# Patient Record
Sex: Female | Born: 1980
Health system: Southern US, Community
[De-identification: ages and names within clinical notes are randomized; demographics above are authoritative.]

## PROBLEM LIST (undated history)

## (undated) DIAGNOSIS — G35D Multiple sclerosis, unspecified: Secondary | ICD-10-CM

## (undated) DIAGNOSIS — H539 Unspecified visual disturbance: Secondary | ICD-10-CM

## (undated) DIAGNOSIS — K5909 Other constipation: Secondary | ICD-10-CM

## (undated) DIAGNOSIS — K589 Irritable bowel syndrome without diarrhea: Secondary | ICD-10-CM

## (undated) DIAGNOSIS — K599 Functional intestinal disorder, unspecified: Secondary | ICD-10-CM

## (undated) DIAGNOSIS — N3289 Other specified disorders of bladder: Secondary | ICD-10-CM

## (undated) DIAGNOSIS — K219 Gastro-esophageal reflux disease without esophagitis: Secondary | ICD-10-CM

## (undated) DIAGNOSIS — N289 Disorder of kidney and ureter, unspecified: Secondary | ICD-10-CM

## (undated) DIAGNOSIS — G629 Polyneuropathy, unspecified: Secondary | ICD-10-CM

## (undated) DIAGNOSIS — G4700 Insomnia, unspecified: Secondary | ICD-10-CM

## (undated) DIAGNOSIS — G35 Multiple sclerosis: Secondary | ICD-10-CM

## (undated) HISTORY — DX: Other constipation: K59.09

## (undated) HISTORY — PX: BREAST SURGERY: SHX581

## (undated) HISTORY — DX: Gastro-esophageal reflux disease without esophagitis: K21.9

## (undated) HISTORY — DX: Polyneuropathy, unspecified: G62.9

## (undated) HISTORY — DX: Insomnia, unspecified: G47.00

## (undated) HISTORY — PX: CYSTECTOMY: SUR359

## (undated) HISTORY — PX: OTHER SURGICAL HISTORY: SHX169

## (undated) HISTORY — PX: UPPER GASTROINTESTINAL ENDOSCOPY: SHX188

## (undated) HISTORY — DX: Unspecified visual disturbance: H53.9

---

## 2003-02-05 HISTORY — PX: BREAST SURGERY: SHX581

## 2011-08-22 ENCOUNTER — Ambulatory Visit
Admission: RE | Admit: 2011-08-22 | Discharge: 2011-08-22 | Disposition: A | Discharge status: Home / Self Care | Payer: No Typology Code available for payment source | Source: Ambulatory Visit | Attending: Specialist | Admitting: Specialist

## 2011-08-22 ENCOUNTER — Other Ambulatory Visit: Payer: Self-pay | Admitting: Specialist

## 2011-08-22 DIAGNOSIS — R52 Pain, unspecified: Secondary | ICD-10-CM

## 2011-11-13 ENCOUNTER — Other Ambulatory Visit: Payer: Self-pay | Admitting: Psychiatry

## 2011-11-13 DIAGNOSIS — G35 Multiple sclerosis: Secondary | ICD-10-CM

## 2011-11-17 ENCOUNTER — Ambulatory Visit
Admission: RE | Admit: 2011-11-17 | Discharge: 2011-11-17 | Disposition: A | Discharge status: Home / Self Care | Payer: Medicaid Other | Source: Ambulatory Visit | Attending: Psychiatry | Admitting: Psychiatry

## 2011-11-17 DIAGNOSIS — G35 Multiple sclerosis: Secondary | ICD-10-CM

## 2011-11-17 MED ORDER — GADOBENATE DIMEGLUMINE 529 MG/ML IV SOLN: 20.0000 mL | Freq: Once | INTRAVENOUS | Status: AC | PRN

## 2011-11-17 MED ORDER — GADOBENATE DIMEGLUMINE 529 MG/ML IV SOLN
20.0000 mL | Freq: Once | INTRAVENOUS | Status: AC | PRN
  Administered 2011-11-17: 20 mL via INTRAVENOUS

## 2012-02-05 ENCOUNTER — Emergency Department (HOSPITAL_COMMUNITY)
Admission: EM | Admit: 2012-02-05 | Discharge: 2012-02-05 | Disposition: A | Discharge status: Home / Self Care | Payer: Self-pay | Attending: Emergency Medicine | Admitting: Emergency Medicine

## 2012-02-05 ENCOUNTER — Emergency Department (HOSPITAL_COMMUNITY): Payer: Self-pay

## 2012-02-05 ENCOUNTER — Encounter (HOSPITAL_COMMUNITY): Payer: Self-pay

## 2012-02-05 DIAGNOSIS — G35 Multiple sclerosis: Secondary | ICD-10-CM | POA: Insufficient documentation

## 2012-02-05 DIAGNOSIS — G43909 Migraine, unspecified, not intractable, without status migrainosus: Secondary | ICD-10-CM | POA: Insufficient documentation

## 2012-02-05 DIAGNOSIS — Z87448 Personal history of other diseases of urinary system: Secondary | ICD-10-CM | POA: Insufficient documentation

## 2012-02-05 DIAGNOSIS — Z8719 Personal history of other diseases of the digestive system: Secondary | ICD-10-CM | POA: Insufficient documentation

## 2012-02-05 DIAGNOSIS — R002 Palpitations: Secondary | ICD-10-CM | POA: Insufficient documentation

## 2012-02-05 DIAGNOSIS — Z79899 Other long term (current) drug therapy: Secondary | ICD-10-CM | POA: Insufficient documentation

## 2012-02-05 DIAGNOSIS — H9209 Otalgia, unspecified ear: Secondary | ICD-10-CM | POA: Insufficient documentation

## 2012-02-05 HISTORY — DX: Multiple sclerosis, unspecified: G35.D

## 2012-02-05 HISTORY — DX: Multiple sclerosis: G35

## 2012-02-05 HISTORY — DX: Irritable bowel syndrome, unspecified: K58.9

## 2012-02-05 HISTORY — DX: Functional intestinal disorder, unspecified: K59.9

## 2012-02-05 HISTORY — DX: Other specified disorders of bladder: N32.89

## 2012-02-05 HISTORY — DX: Disorder of kidney and ureter, unspecified: N28.9

## 2012-02-05 LAB — URINALYSIS, ROUTINE W REFLEX MICROSCOPIC
Hgb urine dipstick: NEGATIVE
Leukocytes, UA: NEGATIVE
Protein, ur: NEGATIVE mg/dL
Urobilinogen, UA: 1 mg/dL (ref 0.0–1.0)

## 2012-02-05 LAB — CBC WITH DIFFERENTIAL/PLATELET
Eosinophils Relative: 1 % (ref 0–5)
HCT: 33.3 % — ABNORMAL LOW (ref 36.0–46.0)
Lymphocytes Relative: 45 % (ref 12–46)
Lymphs Abs: 3.1 10*3/uL (ref 0.7–4.0)
MCV: 77.8 fL — ABNORMAL LOW (ref 78.0–100.0)
Monocytes Absolute: 0.4 10*3/uL (ref 0.1–1.0)
Monocytes Relative: 6 % (ref 3–12)
RBC: 4.28 MIL/uL (ref 3.87–5.11)
RDW: 13.7 % (ref 11.5–15.5)
WBC: 7 10*3/uL (ref 4.0–10.5)

## 2012-02-05 LAB — COMPREHENSIVE METABOLIC PANEL
BUN: 12 mg/dL (ref 6–23)
CO2: 25 mEq/L (ref 19–32)
Calcium: 9.8 mg/dL (ref 8.4–10.5)
Creatinine, Ser: 0.68 mg/dL (ref 0.50–1.10)
GFR calc Af Amer: >90 mL/min (ref >90)
GFR calc non Af Amer: >90 mL/min (ref >90)
Glucose, Bld: 88 mg/dL (ref 70–99)

## 2012-02-05 MED ORDER — METOCLOPRAMIDE HCL 5 MG/ML IJ SOLN
10.0000 mg | Freq: Once | INTRAMUSCULAR | Status: AC
  Administered 2012-02-05: 10 mg via INTRAVENOUS
  Filled 2012-02-05: qty 2

## 2012-02-05 MED ORDER — SODIUM CHLORIDE 0.9 % IV BOLUS (SEPSIS)
1000.0000 mL | Freq: Once | INTRAVENOUS | Status: AC
  Administered 2012-02-05: 1000 mL via INTRAVENOUS

## 2012-02-05 NOTE — ED Notes (Signed)
Patient reports that she has been having palpitations intermittently x 4 days. Patient has a history MS. Patient also c/o sharp left ear pain and left occipital pain. Patient states she was treated for an ear infection in November/2013

## 2012-02-05 NOTE — ED Provider Notes (Signed)
History     CSN: [id number]  Arrival date & time 02/05/12  1742   First MD Initiated Contact with Patient 02/05/12 1823      Chief Complaint  Patient presents with  . palpatations   . Otalgia  . Headache    (Consider location/radiation/quality/duration/timing/severity/associated sxs/prior treatment) The history is provided by the patient.  Joy Cobb is a 32 y.o. female history of relapsing remitting MS here presenting with palpitations and left ear pain and L sided headaches and R foot pain. She's been having left-sided headaches and left ear pain for the last 3 weeks. She was diagnosed with left otitis externa 2 months ago and was placed on Ciprodex. She had intermittent headaches since then, felt like sharp sensation. She has been taking her norco and lyrica and it hasn't helped. No blurry vision. No fevers. Also intermittent palpitations but no chest pain. She also has pain in R foot pain that is progressively worse. No injuries. She usually walks with a walker and now she felt worse. She had an exacerbation a year ago but she said that she was much weaker last time.    Past Medical History  Diagnosis Date  . Multiple sclerosis   . IBS (irritable bowel syndrome)   . Colonic inertia   . Renal disorder   . Bladder spasms     Past Surgical History  Procedure Date  . Breast surgery   . Cystectomy   . Polyp removal     No family history on file.  History  Substance Use Topics  . Smoking status: Never Smoker   . Smokeless tobacco: Never Used  . Alcohol Use: No    OB History    Grav Para Term Preterm Abortions TAB SAB Ect Mult Living                  Review of Systems  HENT: Positive for ear pain.   Cardiovascular: Positive for palpitations.  Neurological: Positive for headaches.  All other systems reviewed and are negative.    Allergies  Penicillins and Septra  Home Medications   Current Outpatient Rx  Name  Route  Sig  Dispense  Refill  .  AMPHETAMINE-DEXTROAMPHETAMINE 15 MG PO TABS   Oral   Take 15 mg by mouth daily.         Marland Kitchen BACLOFEN 10 MG PO TABS   Oral   Take 10 mg by mouth 3 (three) times daily.         Marland Kitchen DIMETHYL FUMARATE 240 MG PO CPDR   Oral   Take 240 mg by mouth 2 (two) times daily.         . DROSPIRENONE-ETHINYL ESTRADIOL 3-0.02 MG PO TABS   Oral   Take 1 tablet by mouth daily.         . DULOXETINE HCL 30 MG PO CPEP   Oral   Take 30 mg by mouth daily.         . FENTANYL 25 MCG/HR TD PT72   Transdermal   Place 1 patch onto the skin every 3 (three) days.         Marland Kitchen FLUOXETINE HCL 20 MG PO CAPS   Oral   Take 20 mg by mouth daily.         Marland Kitchen HYDROCODONE-ACETAMINOPHEN 7.5-325 MG PO TABS   Oral   Take 1 tablet by mouth every 6 (six) hours as needed. PAIN         . LACTULOSE 10 GM/15ML  PO SOLN   Oral   Take 20 g by mouth 3 (three) times daily.         Marland Kitchen OMEPRAZOLE 40 MG PO CPDR   Oral   Take 40 mg by mouth 2 (two) times daily.         Marland Kitchen PREGABALIN 300 MG PO CAPS   Oral   Take 300 mg by mouth daily.           BP 110/46  Pulse 87  Temp 98 F (36.7 C) (Oral)  Resp 18  SpO2 99%  Physical Exam  Nursing note and vitals reviewed. Constitutional: She is oriented to person, place, and time. She appears well-developed and well-nourished.       Anxious   HENT:  Head: Normocephalic.  Right Ear: External ear normal.  Left Ear: External ear normal.  Mouth/Throat: Oropharynx is clear and moist.       Tenderness L side of scalp. Ears unremarkable. No otitis externa or media observed   Eyes: Conjunctivae normal are normal. Pupils are equal, round, and reactive to light.  Neck: Normal range of motion. Neck supple.  Cardiovascular: Normal rate, regular rhythm and normal heart sounds.   Pulmonary/Chest: Effort normal and breath sounds normal. No respiratory distress. She has no wheezes. She has no rales.  Abdominal: Soft. Bowel sounds are normal. She exhibits no distension. There  is no tenderness. There is no rebound.  Musculoskeletal: Normal range of motion.       1+ edema (chronic). Mild tenderness on the plantar side of R foot.   Neurological: She is alert and oriented to person, place, and time.       Strength 4/5 L leg, 5/5 throughout.   Skin: Skin is warm and dry.  Psychiatric: She has a normal mood and affect. Her behavior is normal. Judgment and thought content normal.    ED Course  Procedures (including critical care time)  Labs Reviewed  CBC WITH DIFFERENTIAL - Abnormal; Notable for the following:    HCT 33.3 (*)     MCV 77.8 (*)     MCHC 36.3 (*)     All other components within normal limits  COMPREHENSIVE METABOLIC PANEL - Abnormal; Notable for the following:    Total Bilirubin 0.2 (*)     All other components within normal limits  TROPONIN I  URINALYSIS, ROUTINE W REFLEX MICROSCOPIC   Dg Chest 2 View  02/05/2012  *RADIOLOGY REPORT*  Clinical Data: palpitations and chest pain  CHEST - 2 VIEW  Comparison: None  Findings: The heart size and mediastinal contours are within normal limits.  Both lungs are clear.  The visualized skeletal structures are unremarkable.  IMPRESSION: No active cardiopulmonary abnormalities.   Original Report Authenticated By: Signa Kell, M.D.    Ct Head Wo Contrast  02/05/2012  *RADIOLOGY REPORT*  Clinical Data: Headache, otalgia  CT HEAD WITHOUT CONTRAST  Technique:  Contiguous axial images were obtained from the base of the skull through the vertex without contrast.  Comparison: MRI cervical spine 11/17/2011  Findings: No acute intracranial hemorrhage, acute infarction, mass lesion, mass effect, hydrocephalus or midline shift.  The gray- white interface is well preserved.  No significant atrophy.  No focal soft tissue abnormality.  No focal calvarial abnormality. The globes are intact.  Normal aeration of the paranasal sinuses and mastoid air cells.  IMPRESSION: Negative noncontrast CT scan of the head.   Original Report  Authenticated By: Malachy Moan, M.D.    Dg Foot Complete  Right  02/05/2012  *RADIOLOGY REPORT*  Clinical Data: Right foot pain  RIGHT FOOT COMPLETE - 3+ VIEW  Comparison: 08/22/2011  Findings: Pes planus deformity.  There is no evidence of fracture or dislocation.  There is no evidence of arthropathy or other focal bone abnormality.  Soft tissues are unremarkable.  IMPRESSION: Negative exam.   Original Report Authenticated By: Signa Kell, M.D.      No diagnosis found.   Date: 02/05/2012  Rate: 84  Rhythm: normal sinus rhythm  QRS Axis: normal  Intervals: normal  ST/T Wave abnormalities: normal  Conduction Disutrbances:none  Narrative Interpretation:   Old EKG Reviewed: none available   MDM  Joy Cobb is a 32 y.o. female here with L sided headache and R foot pain. I think she likely has mild MS exacerbation. I will get basic labs and UA and CXR to r/o infectious cause.    8:57 PM Labs nl, CT head nl, R foot xray nl, UA  And CXR nl. Headache improved. I talked to Dr. Danae Orleans from neuro, who suggest that if she is weaker, she can benefit from a course of IV steroids. I discussed with the patient, who is very afraid of IV steroids since she had it in the past and had side effects. She states that her symptoms are more mild than previously and she rather follow up with neurologist. Return precautions given.          Richardean Canal, MD 02/05/12 2100

## 2012-02-05 NOTE — ED Notes (Signed)
Pt states the past 4 days she has had heart palpitations, L ear ache w/ sharp pains in head. States she had an ear infection in November in that same ear. Pt denies shortness of breath or headache at this time, states does get short of breath when walking or talking on phone sometimes.

## 2012-02-05 NOTE — ED Notes (Signed)
RN to obtain labs with start of IV 

## 2012-02-05 NOTE — ED Notes (Signed)
Patient transported to X-ray 

## 2012-02-14 ENCOUNTER — Inpatient Hospital Stay (HOSPITAL_COMMUNITY)
Admission: EM | Admit: 2012-02-14 | Discharge: 2012-02-17 | DRG: 060 | Disposition: A | Discharge status: Home Health | Payer: Medicaid Other | Attending: Internal Medicine | Admitting: Internal Medicine

## 2012-02-14 ENCOUNTER — Encounter (HOSPITAL_COMMUNITY): Payer: Self-pay | Admitting: *Deleted

## 2012-02-14 DIAGNOSIS — F329 Major depressive disorder, single episode, unspecified: Secondary | ICD-10-CM

## 2012-02-14 DIAGNOSIS — Z880 Allergy status to penicillin: Secondary | ICD-10-CM

## 2012-02-14 DIAGNOSIS — G35 Multiple sclerosis: Principal | ICD-10-CM | POA: Diagnosis present

## 2012-02-14 DIAGNOSIS — Z881 Allergy status to other antibiotic agents status: Secondary | ICD-10-CM

## 2012-02-14 DIAGNOSIS — K219 Gastro-esophageal reflux disease without esophagitis: Secondary | ICD-10-CM | POA: Diagnosis present

## 2012-02-14 DIAGNOSIS — IMO0002 Reserved for concepts with insufficient information to code with codable children: Secondary | ICD-10-CM

## 2012-02-14 DIAGNOSIS — Z6839 Body mass index (BMI) 39.0-39.9, adult: Secondary | ICD-10-CM

## 2012-02-14 DIAGNOSIS — E669 Obesity, unspecified: Secondary | ICD-10-CM | POA: Diagnosis present

## 2012-02-14 DIAGNOSIS — Z79899 Other long term (current) drug therapy: Secondary | ICD-10-CM

## 2012-02-14 DIAGNOSIS — K589 Irritable bowel syndrome without diarrhea: Secondary | ICD-10-CM

## 2012-02-14 DIAGNOSIS — N3289 Other specified disorders of bladder: Secondary | ICD-10-CM | POA: Diagnosis present

## 2012-02-14 DIAGNOSIS — Z888 Allergy status to other drugs, medicaments and biological substances status: Secondary | ICD-10-CM

## 2012-02-14 DIAGNOSIS — F3289 Other specified depressive episodes: Secondary | ICD-10-CM | POA: Diagnosis present

## 2012-02-14 LAB — URINALYSIS, ROUTINE W REFLEX MICROSCOPIC
Bilirubin Urine: NEGATIVE
Glucose, UA: NEGATIVE mg/dL
Hgb urine dipstick: NEGATIVE
Ketones, ur: NEGATIVE mg/dL
Leukocytes, UA: NEGATIVE
Nitrite: NEGATIVE
Protein, ur: NEGATIVE mg/dL
Specific Gravity, Urine: 1.023 (ref 1.005–1.030)
Urobilinogen, UA: 1 mg/dL (ref 0.0–1.0)
pH: 7.5 (ref 5.0–8.0)

## 2012-02-14 MED ORDER — KETOROLAC TROMETHAMINE 30 MG/ML IJ SOLN
30.0000 mg | Freq: Once | INTRAMUSCULAR | Status: AC
  Administered 2012-02-15: 30 mg via INTRAVENOUS
  Filled 2012-02-14: qty 1

## 2012-02-14 MED ORDER — METHYLPREDNISOLONE SODIUM SUCC 125 MG IJ SOLR
125.0000 mg | Freq: Once | INTRAMUSCULAR | Status: AC
  Administered 2012-02-15: 125 mg via INTRAVENOUS
  Filled 2012-02-14: qty 2

## 2012-02-14 NOTE — ED Notes (Signed)
Patient says that she is having pain in her head, legs, feet and back. Pt says she was treated for MS exacerbation recently and pain has not improved

## 2012-02-15 ENCOUNTER — Encounter (HOSPITAL_COMMUNITY): Payer: Self-pay | Admitting: Internal Medicine

## 2012-02-15 DIAGNOSIS — F3289 Other specified depressive episodes: Secondary | ICD-10-CM

## 2012-02-15 DIAGNOSIS — F329 Major depressive disorder, single episode, unspecified: Secondary | ICD-10-CM

## 2012-02-15 DIAGNOSIS — F32A Depression, unspecified: Secondary | ICD-10-CM | POA: Diagnosis present

## 2012-02-15 DIAGNOSIS — K589 Irritable bowel syndrome without diarrhea: Secondary | ICD-10-CM

## 2012-02-15 DIAGNOSIS — G35 Multiple sclerosis: Principal | ICD-10-CM

## 2012-02-15 DIAGNOSIS — K219 Gastro-esophageal reflux disease without esophagitis: Secondary | ICD-10-CM | POA: Diagnosis present

## 2012-02-15 DIAGNOSIS — G35D Multiple sclerosis, unspecified: Principal | ICD-10-CM | POA: Diagnosis present

## 2012-02-15 LAB — BASIC METABOLIC PANEL
BUN: 13 mg/dL (ref 6–23)
Calcium: 9.2 mg/dL (ref 8.4–10.5)
Chloride: 99 mEq/L (ref 96–112)
Creatinine, Ser: 0.76 mg/dL (ref 0.50–1.10)
GFR calc Af Amer: >90 mL/min (ref >90)
GFR calc non Af Amer: >90 mL/min (ref >90)

## 2012-02-15 LAB — CBC WITH DIFFERENTIAL/PLATELET
Basophils Absolute: 0 10*3/uL (ref 0.0–0.1)
Basophils Relative: 0 % (ref 0–1)
Eosinophils Absolute: 0 10*3/uL (ref 0.0–0.7)
Eosinophils Relative: 0 % (ref 0–5)
HCT: 36.4 % (ref 36.0–46.0)
MCH: 28.5 pg (ref 26.0–34.0)
MCHC: 36 g/dL (ref 30.0–36.0)
Monocytes Absolute: 0.7 10*3/uL (ref 0.1–1.0)
Neutro Abs: 10 10*3/uL — ABNORMAL HIGH (ref 1.7–7.7)
RDW: 14.2 % (ref 11.5–15.5)

## 2012-02-15 LAB — TSH: TSH: 0.246 u[IU]/mL — ABNORMAL LOW (ref 0.350–4.500)

## 2012-02-15 MED ORDER — FENTANYL 25 MCG/HR TD PT72
25.0000 ug | MEDICATED_PATCH | TRANSDERMAL | Status: DC
  Filled 2012-02-15: qty 1

## 2012-02-15 MED ORDER — PANTOPRAZOLE SODIUM 40 MG PO TBEC
40.0000 mg | DELAYED_RELEASE_TABLET | Freq: Every day | ORAL | Status: DC
  Administered 2012-02-16 – 2012-02-17 (×2): 40 mg via ORAL
  Filled 2012-02-15 (×2): qty 1

## 2012-02-15 MED ORDER — ONDANSETRON HCL 4 MG PO TABS: 4.0000 mg | ORAL_TABLET | Freq: Four times a day (QID) | ORAL | Status: DC | PRN

## 2012-02-15 MED ORDER — PREGABALIN 100 MG PO CAPS
300.0000 mg | ORAL_CAPSULE | Freq: Every day | ORAL | Status: DC
  Administered 2012-02-15 – 2012-02-16 (×2): 300 mg via ORAL
  Filled 2012-02-15: qty 1
  Filled 2012-02-15: qty 3
  Filled 2012-02-15: qty 2

## 2012-02-15 MED ORDER — SODIUM CHLORIDE 0.9 % IV SOLN
INTRAVENOUS | Status: AC
  Administered 2012-02-15: 06:00:00 via INTRAVENOUS

## 2012-02-15 MED ORDER — SODIUM CHLORIDE 0.9 % IV SOLN
875.0000 mg | Freq: Once | INTRAVENOUS | Status: AC
  Administered 2012-02-15: 875 mg via INTRAVENOUS
  Filled 2012-02-15: qty 7.04

## 2012-02-15 MED ORDER — ZOLPIDEM TARTRATE 5 MG PO TABS
5.0000 mg | ORAL_TABLET | Freq: Every evening | ORAL | Status: DC | PRN
  Administered 2012-02-16 (×2): 5 mg via ORAL
  Filled 2012-02-15 (×2): qty 1

## 2012-02-15 MED ORDER — SODIUM CHLORIDE 0.9 % IV SOLN
1000.0000 mg | INTRAVENOUS | Status: DC
  Administered 2012-02-16 – 2012-02-17 (×2): 1000 mg via INTRAVENOUS
  Filled 2012-02-15 (×2): qty 8

## 2012-02-15 MED ORDER — HYDROMORPHONE HCL PF 1 MG/ML IJ SOLN
1.0000 mg | INTRAMUSCULAR | Status: DC | PRN
  Administered 2012-02-15 – 2012-02-16 (×3): 1 mg via INTRAVENOUS
  Filled 2012-02-15 (×3): qty 1

## 2012-02-15 MED ORDER — DULOXETINE HCL 30 MG PO CPEP
30.0000 mg | ORAL_CAPSULE | Freq: Every day | ORAL | Status: DC
  Filled 2012-02-15 (×3): qty 1

## 2012-02-15 MED ORDER — HYDROMORPHONE HCL PF 1 MG/ML IJ SOLN
0.5000 mg | Freq: Once | INTRAMUSCULAR | Status: AC
  Administered 2012-02-15: 0.5 mg via INTRAVENOUS
  Filled 2012-02-15: qty 1

## 2012-02-15 MED ORDER — ONDANSETRON HCL 4 MG/2ML IJ SOLN: 4.0000 mg | Freq: Four times a day (QID) | INTRAMUSCULAR | Status: DC | PRN

## 2012-02-15 MED ORDER — AMPHETAMINE-DEXTROAMPHETAMINE 10 MG PO TABS
15.0000 mg | ORAL_TABLET | Freq: Every day | ORAL | Status: DC
  Filled 2012-02-15: qty 2

## 2012-02-15 MED ORDER — DROSPIRENONE-ETHINYL ESTRADIOL 3-0.02 MG PO TABS
1.0000 | ORAL_TABLET | Freq: Every day | ORAL | Status: DC
  Administered 2012-02-16 – 2012-02-17 (×2): 1 via ORAL

## 2012-02-15 MED ORDER — ENOXAPARIN SODIUM 40 MG/0.4ML ~~LOC~~ SOLN
40.0000 mg | SUBCUTANEOUS | Status: DC
  Administered 2012-02-15: 40 mg via SUBCUTANEOUS
  Filled 2012-02-15 (×3): qty 0.4

## 2012-02-15 MED ORDER — PANTOPRAZOLE SODIUM 40 MG PO TBEC: 40.0000 mg | DELAYED_RELEASE_TABLET | Freq: Every day | ORAL | Status: DC

## 2012-02-15 MED ORDER — SODIUM CHLORIDE 0.9 % IJ SOLN
3.0000 mL | Freq: Two times a day (BID) | INTRAMUSCULAR | Status: DC
  Administered 2012-02-15 – 2012-02-17 (×4): 3 mL via INTRAVENOUS

## 2012-02-15 MED ORDER — FLUOXETINE HCL 20 MG PO CAPS
20.0000 mg | ORAL_CAPSULE | Freq: Every day | ORAL | Status: DC
  Administered 2012-02-15 – 2012-02-17 (×3): 20 mg via ORAL
  Filled 2012-02-15 (×3): qty 1

## 2012-02-15 MED ORDER — BACLOFEN 10 MG PO TABS
10.0000 mg | ORAL_TABLET | Freq: Two times a day (BID) | ORAL | Status: DC
  Administered 2012-02-15 – 2012-02-17 (×5): 10 mg via ORAL
  Filled 2012-02-15 (×6): qty 1

## 2012-02-15 MED ORDER — HYDROCODONE-ACETAMINOPHEN 7.5-325 MG PO TABS
2.0000 | ORAL_TABLET | Freq: Four times a day (QID) | ORAL | Status: DC | PRN
  Administered 2012-02-15 – 2012-02-17 (×4): 2 via ORAL
  Filled 2012-02-15 (×4): qty 2

## 2012-02-15 MED ORDER — DIMETHYL FUMARATE 240 MG PO CPDR: 240.0000 mg | DELAYED_RELEASE_CAPSULE | Freq: Two times a day (BID) | ORAL | Status: DC

## 2012-02-15 MED ORDER — LACTULOSE 10 GM/15ML PO SOLN
20.0000 g | Freq: Two times a day (BID) | ORAL | Status: DC | PRN
  Filled 2012-02-15: qty 30

## 2012-02-15 NOTE — H&P (Signed)
[facility] History and Physical  Joy Cobb ZOX:[id number] DOB: 1980-10-17    PCP:   In [place], she recently moved here.  Chief Complaint: back pain, paresthesia, and weakness.  HPI: Joy Cobb is an 32 y.o. female with hx of remitting/relapsing MS, hx of IBS, bladder spasm, on Tecfidera, presents to the ER about 10 days ago complaining of URI symptoms, but was also having back and leg pain and paresthesia.  She was evaluated, found no evidence of infection, and declined any treatment for her MS exacerbation.  She was started however on Prednisone, and continued to have back pain, leg pain and paresthesia and weakness, so she returned to the ER.  She denied any HA, fever, chills, nausea or vomiting.  Evaluation in the ER showed WBC of 14K, and electrolytes along with renal fx tests were unremarkable.  Hospitalist was asked to admit her for tx of MS exacerbation.  She has no bowel or bladder incontinence.  Rewiew of Systems:  Constitutional: Negative for malaise, fever and chills. No significant weight loss or weight gain Eyes: Negative for eye pain, redness and discharge, diplopia, visual changes, or flashes of light. ENMT: Negative for ear pain, hoarseness, nasal congestion, sinus pressure and sore throat. No headaches; tinnitus, drooling, or problem swallowing. Cardiovascular: Negative for chest pain, palpitations, diaphoresis, dyspnea and peripheral edema. ; No orthopnea, PND Respiratory: Negative for cough, hemoptysis, wheezing and stridor. No pleuritic chestpain. Gastrointestinal: Negative for nausea, vomiting, diarrhea, constipation, abdominal pain, melena, blood in stool, hematemesis, jaundice and rectal bleeding.    Genitourinary: Negative for frequency, dysuria, incontinence,flank pain and hematuria; Musculoskeletal: Negative for and neck pain. Negative for swelling and trauma.;  Skin: . Negative for pruritus, rash, abrasions, bruising and skin lesion.;  ulcerations Neuro: Negative for headache, lightheadedness and neck stiffness. Negative for weakness, altered level of consciousness , altered mental status,  burning feet, involuntary movement, seizure and syncope.  Psych: negative for anxiety, depression, insomnia, tearfulness, panic attacks, hallucinations, paranoia, suicidal or homicidal ideation    Past Medical History  Diagnosis Date  . Multiple sclerosis   . IBS (irritable bowel syndrome)   . Colonic inertia   . Renal disorder   . Bladder spasms     Past Surgical History  Procedure Date  . Breast surgery   . Cystectomy   . Polyp removal     Medications:  HOME MEDS: Prior to Admission medications   Medication Sig Start Date End Date Taking? Authorizing Provider  amphetamine-dextroamphetamine (ADDERALL) 15 MG tablet Take 15 mg by mouth daily.   Yes Historical Provider, MD  baclofen (LIORESAL) 10 MG tablet Take 10 mg by mouth 2 (two) times daily.    Yes Historical Provider, MD  Dimethyl Fumarate (TECFIDERA) 240 MG CPDR Take 240 mg by mouth 2 (two) times daily.   Yes Historical Provider, MD  drospirenone-ethinyl estradiol (YAZ,GIANVI,LORYNA) 3-0.02 MG tablet Take 1 tablet by mouth daily.   Yes Historical Provider, MD  DULoxetine (CYMBALTA) 30 MG capsule Take 30 mg by mouth at bedtime.    Yes Historical Provider, MD  fentaNYL (DURAGESIC - DOSED MCG/HR) 25 MCG/HR Place 1 patch onto the skin every 3 (three) days.   Yes Historical Provider, MD  FLUoxetine (PROZAC) 20 MG capsule Take 20 mg by mouth daily.   Yes Historical Provider, MD  HYDROcodone-acetaminophen (NORCO) 7.5-325 MG per tablet Take 2 tablets by mouth every 6 (six) hours as needed. PAIN   Yes Historical Provider, MD  lactulose (CHRONULAC) 10 GM/15ML solution Take 20 g  by mouth 2 (two) times daily as needed. constipation   Yes Historical Provider, MD  omeprazole (PRILOSEC) 40 MG capsule Take 40 mg by mouth 2 (two) times daily.   Yes Historical Provider, MD  predniSONE  (DELTASONE) 20 MG tablet Take 10-200 mg by mouth See admin instructions. 10tabs x 5days, 5tabs x 2days, 3tabs x 2days, 2tabs x 2days, 1tab x 2days, 1/2tab x 2 days   Yes Historical Provider, MD  pregabalin (LYRICA) 300 MG capsule Take 300 mg by mouth at bedtime.    Yes Historical Provider, MD     Allergies:  Allergies  Allergen Reactions  . Amoxicillin   . Penicillins Hives  . Temazepam     Sharp pains in stomach  . Septra (Sulfamethoxazole W-Trimethoprim) Rash    Social History:   reports that she has never smoked. She has never used smokeless tobacco. She reports that she does not drink alcohol or use illicit drugs.  Family History: No family history on file.   Physical Exam: Filed Vitals:   02/14/12 1959 02/14/12 2330 02/15/12 0149 02/15/12 0301  BP: 139/85 166/91 142/87 115/73  Pulse: 55 56 58 70  Temp: 99.1 F (37.3 C)   98.3 F (36.8 C)  TempSrc:    Oral  Resp: 18  16 18   Height:    5\' 5"  (1.651 m)  Weight:    106.7 kg (235 lb 3.7 oz)  SpO2: 99% 100% 100% 99%   Blood pressure 115/73, pulse 70, temperature 98.3 F (36.8 C), temperature source Oral, resp. rate 18, height 5\' 5"  (1.651 m), weight 106.7 kg (235 lb 3.7 oz), SpO2 99.00%.  GEN:  Pleasant patient lying in the stretcher in no acute distress; cooperative with exam. PSYCH:  alert and oriented x4; does not appear anxious or depressed; affect is appropriate. HEENT: Mucous membranes pink and anicteric; PERRLA; EOM intact; no cervical lymphadenopathy nor thyromegaly or carotid bruit; no JVD; There were no stridor. Neck is very supple. Breasts:: Not examined CHEST WALL: No tenderness CHEST: Normal respiration, clear to auscultation bilaterally.  HEART: Regular rate and rhythm.  There are no murmur, rub, or gallops.   BACK: No kyphosis or scoliosis; no CVA tenderness ABDOMEN: soft and non-tender; no masses, no organomegaly, normal abdominal bowel sounds; no pannus; no intertriginous candida. There is no rebound and  no distention. Rectal Exam: Not done EXTREMITIES: No bone or joint deformity; age-appropriate arthropathy of the hands and knees; no edema; no ulcerations.  There is no calf tenderness. Genitalia: not examined PULSES: 2+ and symmetric SKIN: Normal hydration no rash or ulceration CNS: Cranial nerves 2-12 grossly intact no focal lateralizing neurologic deficit.  Speech is fluent; uvula elevated with phonation, facial symmetry and tongue midline. DTR are normal bilaterally, cerebella exam is intact, barbinski is negative and strengths are equaled bilaterally.  No sensory loss.   Labs on Admission:  Basic Metabolic Panel:  Lab 02/14/12 4098  NA 136  K 3.7  CL 99  CO2 29  GLUCOSE 98  BUN 13  CREATININE 0.76  CALCIUM 9.2  MG --  PHOS --   Liver Function Tests: No results found for this basename: AST:5,ALT:5,ALKPHOS:5,BILITOT:5,PROT:5,ALBUMIN:5 in the last 168 hours No results found for this basename: LIPASE:5,AMYLASE:5 in the last 168 hours No results found for this basename: AMMONIA:5 in the last 168 hours CBC:  Lab 02/14/12 2345  WBC 14.0*  NEUTROABS 10.0*  HGB 13.1  HCT 36.4  MCV 79.1  PLT 247   Cardiac Enzymes: No results found  for this basename: CKTOTAL:5,CKMB:5,CKMBINDEX:5,TROPONINI:5 in the last 168 hours  CBG: No results found for this basename: GLUCAP:5 in the last 168 hours   Radiological Exams on Admission: No results found.   Assessment/Plan Present on Admission:  . IBS (irritable bowel syndrome) . Depression MS exacerbation.  PLAN:  Will admit her for MS exacerbation.  The new MS medication Tecfidera can have cardiac symptoms so will put her on telemetry. She had an EKG on 02/05/12, so I didn't repeat one.  For her MS, will give high dose steroid, IVF along with IV pain meds.  I have continued her other medications.  Will hold off on giving IVIG at this time. She had received plasmapheresis before, but her symptoms are milder this time. She is stable, full  code, and will be admitted to [facility] service.  I plan to complete the steroid IV for three days total.  Thank you for allowing me to partake in the care of your patient. Other plans as per orders.  Code Status: FULL Unk Lightning, MD. [facility] Pager [phone] 7pm to 7am.  02/15/2012, 3:51 AM

## 2012-02-15 NOTE — Progress Notes (Signed)
SR on tele throughout day.  Denies new discomfort, sx

## 2012-02-15 NOTE — Progress Notes (Signed)
Followup note:   Patient admitted earlier this morning. Seen after arrival to floor. Currently very tired, no acute complaints. Vital signs stable.  Cardiovascular regular rate and rhythm, S1-S2 Lungs: Clear to auscultation bilaterally  Assessment and plan: MS flare: Continue IV site Metro. Next dose due tomorrow. Monitor for weakness.

## 2012-02-15 NOTE — Progress Notes (Signed)
Patient came up from ED.  Called Dr. Conley Rolls and he confirmed that cardiac monitoring was needed.  Patient was stable and comfortable.  Called report to [facility].  The nurse tech and I then transferred her to room 1439.  Fatima Sanger A

## 2012-02-15 NOTE — ED Notes (Signed)
Bed:WA06<BR> Expected date:<BR> Expected time:<BR> Means of arrival:<BR> Comments:<BR>

## 2012-02-15 NOTE — ED Provider Notes (Signed)
History     CSN: [id number]  Arrival date & time 02/14/12  7829   First MD Initiated Contact with Patient 02/14/12 2221      Chief Complaint  Patient presents with  . Weakness    (Consider location/radiation/quality/duration/timing/severity/associated sxs/prior treatment) HPI History provided by pt.   Pt has severe relapsing MS.  Most recent exacerbation in 01/2011; admitted in [place], where she  recently re-located from.  For the past 1+ week, she has been experiencing severe low back and bilateral LE pain.  Associated w/ LE weakness and paresthesias.  All of these sx are typical of her exacerbations.  Denies fever and urinary/bladder dysfunction.  Denies trauma.  No recent illnesses.  Was evaluated by her neurologist at onset,  has been compliant w/ prescribed course of prednisone. And sx have worsened over past 2 days.   Past Medical History  Diagnosis Date  . Multiple sclerosis   . IBS (irritable bowel syndrome)   . Colonic inertia   . Renal disorder   . Bladder spasms     Past Surgical History  Procedure Date  . Breast surgery   . Cystectomy   . Polyp removal     No family history on file.  History  Substance Use Topics  . Smoking status: Never Smoker   . Smokeless tobacco: Never Used  . Alcohol Use: No    OB History    Grav Para Term Preterm Abortions TAB SAB Ect Mult Living                  Review of Systems  All other systems reviewed and are negative.    Allergies  Amoxicillin; Penicillins; Temazepam; and Septra  Home Medications   Current Outpatient Rx  Name  Route  Sig  Dispense  Refill  . AMPHETAMINE-DEXTROAMPHETAMINE 15 MG PO TABS   Oral   Take 15 mg by mouth daily.         Marland Kitchen BACLOFEN 10 MG PO TABS   Oral   Take 10 mg by mouth 2 (two) times daily.          Marland Kitchen DIMETHYL FUMARATE 240 MG PO CPDR   Oral   Take 240 mg by mouth 2 (two) times daily.         . DROSPIRENONE-ETHINYL ESTRADIOL 3-0.02 MG PO TABS   Oral   Take 1  tablet by mouth daily.         . DULOXETINE HCL 30 MG PO CPEP   Oral   Take 30 mg by mouth at bedtime.          . FENTANYL 25 MCG/HR TD PT72   Transdermal   Place 1 patch onto the skin every 3 (three) days.         Marland Kitchen FLUOXETINE HCL 20 MG PO CAPS   Oral   Take 20 mg by mouth daily.         Marland Kitchen HYDROCODONE-ACETAMINOPHEN 7.5-325 MG PO TABS   Oral   Take 2 tablets by mouth every 6 (six) hours as needed. PAIN         . LACTULOSE 10 GM/15ML PO SOLN   Oral   Take 20 g by mouth 2 (two) times daily as needed. constipation         . OMEPRAZOLE 40 MG PO CPDR   Oral   Take 40 mg by mouth 2 (two) times daily.         Marland Kitchen PREDNISONE 20 MG PO TABS  Oral   Take 10-200 mg by mouth See admin instructions. 10tabs x 5days, 5tabs x 2days, 3tabs x 2days, 2tabs x 2days, 1tab x 2days, 1/2tab x 2 days         . PREGABALIN 300 MG PO CAPS   Oral   Take 300 mg by mouth at bedtime.            BP 142/87  Pulse 58  Temp 99.1 F (37.3 C)  Resp 16  SpO2 100%  Physical Exam  Nursing note and vitals reviewed. Constitutional: She is oriented to person, place, and time. She appears well-developed and well-nourished.  HENT:  Head: Normocephalic and atraumatic.  Eyes:       Normal appearance  Neck: Normal range of motion.  Cardiovascular: Normal rate and regular rhythm.   Pulmonary/Chest: Effort normal and breath sounds normal.  Genitourinary:       No CVA ttp  Musculoskeletal:       Low back is non-tender.  Diffuse tenderness of LEs. Full active ROM and 5/5 and equal LE strength.  Nml patellar reflexes.  No saddle anesthesia. Distal sensation intact.  2+ DP pulses.  Per nursing staff, pt can ambulate independently into and out of bathroom.    Neurological: She is alert and oriented to person, place, and time.  Skin: Skin is warm and dry. No rash noted.  Psychiatric: She has a normal mood and affect. Her behavior is normal.    ED Course  Procedures (including critical care  time)  Labs Reviewed  CBC WITH DIFFERENTIAL - Abnormal; Notable for the following:    WBC 14.0 (*)     Neutro Abs 10.0 (*)     All other components within normal limits  URINALYSIS, ROUTINE W REFLEX MICROSCOPIC  BASIC METABOLIC PANEL   No results found.   1. Multiple sclerosis       MDM  32yo F presents w/ cc of typical MS exacerbation w/ severe low back and bilateral LE pain + LE weakness.  Sx worsening despite compliance w/ 8d course of prednisone, prescribed by her neurologist at onset.  Currently unable to take care of herself at home.  On exam, afebrile, no spinal tenderness, NV LEs intact.  Ambulatory per nursing staff.  Labs unremarkable.  Pt did not have any relief w/ IV toradol and dilaudid.  She has received 1000mg  IV solumedrol.  [facility] consulted for admission.          Otilio Miu, PA-C 02/15/12 2314

## 2012-02-16 DIAGNOSIS — K219 Gastro-esophageal reflux disease without esophagitis: Secondary | ICD-10-CM

## 2012-02-16 NOTE — Progress Notes (Signed)
[facility] PROGRESS NOTE  Joy Cobb ZOX:[id number] DOB: [birth year] DOA: 02/14/2012 PCP: No primary provider on file.  Assessment/Plan: Principal Problem:  *Multiple sclerosis: Date 2/3 IV steroids plus continue tecefidera. Active Problems:  IBS (irritable bowel syndrome): Stable  Depression: Stable  GERD (gastroesophageal reflux disease): Stable   Code Status: Full code Family Communication: Plan discuss with patient at the bedside. Disposition Plan: Home tomorrow, possibly with home PT   Consultants:  None  Procedures:  None  Antibiotics:  None  HPI/Subjective: Patient doing okay. Some back and leg pain, although somewhat improved from previous. Managed with pain medication  Objective: Filed Vitals:   02/15/12 0434 02/15/12 1444 02/15/12 2202 02/16/12 0504  BP: 132/72 126/66 141/76 154/93  Pulse: 58 63 60 55  Temp: 98.1 F (36.7 C) 98.2 F (36.8 C) 98.1 F (36.7 C) 98 F (36.7 C)  TempSrc: Oral Oral Oral Oral  Resp: 18  18 18   Height: 5\' 5"  (1.651 m)     Weight: 106.7 kg (235 lb 3.7 oz)     SpO2: 100% 100% 100% 100%    Intake/Output Summary (Last 24 hours) at 02/16/12 1341 Last data filed at 02/16/12 0620  Gross per 24 hour  Intake   1625 ml  Output      3 ml  Net   1622 ml   Filed Weights   02/15/12 0301 02/15/12 0434  Weight: 106.7 kg (235 lb 3.7 oz) 106.7 kg (235 lb 3.7 oz)    Exam:   General:  Alert and oriented x3, no acute distress  Cardiovascular: Regular rate and rhythm, S1-S2  Respiratory: Clear to auscultation bilaterally  Abdomen: Soft, obese, nontender, positive bowel sounds  Extremities: No clubbing or cyanosis, trace pitting edema from midcalf down bilaterally  Data Reviewed: Basic Metabolic Panel:  Lab 02/14/12 8119  NA 136  K 3.7  CL 99  CO2 29  GLUCOSE 98  BUN 13  CREATININE 0.76  CALCIUM 9.2  MG --  PHOS --   CBC:  Lab 02/14/12 2345  WBC 14.0*  NEUTROABS 10.0*  HGB 13.1  HCT 36.4  MCV 79.1    PLT 247     Studies: No results found.  Scheduled Meds:   . amphetamine-dextroamphetamine  15 mg Oral Daily  . baclofen  10 mg Oral BID  . drospirenone-ethinyl estradiol  1 tablet Oral Daily  . DULoxetine  30 mg Oral QHS  . enoxaparin (LOVENOX) injection  40 mg Subcutaneous Q24H  . fentaNYL  25 mcg Transdermal Q72H  . FLUoxetine  20 mg Oral Daily  . methylPREDNISolone (SOLU-MEDROL) injection  1,000 mg Intravenous Q24H  . pantoprazole  40 mg Oral Daily  . pregabalin  300 mg Oral QHS  . sodium chloride  3 mL Intravenous Q12H   Continuous Infusions:   Principal Problem:  *Multiple sclerosis Active Problems:  IBS (irritable bowel syndrome)  Depression  GERD (gastroesophageal reflux disease)    Time spent: 15 minutes    Hollice Espy  [facility] Pager [phone]. If 8PM-8AM, please contact night-coverage at [email or web address], password [facility] 02/16/2012, 1:41 PM  LOS: 2 days

## 2012-02-17 DIAGNOSIS — E669 Obesity, unspecified: Secondary | ICD-10-CM | POA: Diagnosis present

## 2012-02-17 MED ORDER — DIMETHYL FUMARATE 240 MG PO CPDR
240.0000 mg | DELAYED_RELEASE_CAPSULE | Freq: Two times a day (BID) | ORAL | Status: DC
  Administered 2012-02-17: 240 mg via ORAL
  Filled 2012-02-17 (×2): qty 1

## 2012-02-17 MED ORDER — OXYCODONE HCL 5 MG PO TABS: 5.0000 mg | ORAL_TABLET | ORAL | Status: DC | PRN

## 2012-02-17 NOTE — ED Provider Notes (Signed)
Medical screening examination/treatment/procedure(s) were performed by non-physician practitioner and as supervising physician I was immediately available for consultation/collaboration.  Leonore Frankson, MD 02/17/12 2100 

## 2012-02-17 NOTE — Progress Notes (Signed)
Talked to patient about DCP/ HHC choices, patient chose [facility]; Norberta Keens RN with [facility] called for arrangements; Patient lives with family members and is expecting a motorized wheelchair to arrive at any day; PCP is Dr Julio Sicks and [facility]; patient stated that she can pay for her prescriptions at discharge. Patient is very emotional, lots of emotional support given; Patient knows to follow up with her PCP and Neurologist at discharge; B Theodore Virgin RN,BSN,MHA.

## 2012-02-17 NOTE — Discharge Summary (Signed)
Physician Discharge Summary  Shakiyah Cirilo OZH:[id number] DOB: Oct 08, 1980 DOA: 02/14/2012  PCP: Dr.Osei-Bonsu  Admit date: 02/14/2012 Discharge date: 02/17/2012  Time spent: 25 minutes  Recommendations for Outpatient Follow-up:  1. Home health PT 2. Patient will follow up with her neurologist as scheduled on Wednesday. 3. She's been given information to establish with a new primary care physician.  Discharge Diagnoses:  Principal Problem:  *Multiple sclerosis:  Active Problems:  IBS (irritable bowel syndrome)  Depression  GERD (gastroesophageal reflux disease)  Obesity   Discharge Condition: Improved, being discharged to home  Diet recommendation: Regular  Filed Weights   02/15/12 0301 02/15/12 0434  Weight: 106.7 kg (235 lb 3.7 oz) 106.7 kg (235 lb 3.7 oz)    History of present illness:  Joy Cobb is an 32 y.o. female with hx of remitting/relapsing MS, hx of IBS, bladder spasm, on Tecfidera, presents to the ER about 10 days ago complaining of URI symptoms, but was also having back and leg pain and paresthesia. She was evaluated, found no evidence of infection, and declined any treatment for her MS exacerbation. She was started however on Prednisone, and continued to have back pain, leg pain and paresthesia and weakness, so she returned to the ER. She denied any HA, fever, chills, nausea or vomiting. Evaluation in the ER showed WBC of 14K, and electrolytes along with renal fx tests were unremarkable. Hospitalist was asked to admit her for tx of MS exacerbation. She has no bowel or bladder incontinence.   Hospital Course:  Principal Problem:  *Multiple sclerosis: Patient received 3 days of IV Solu-Medrol. She was also continued on her Tecfidera without incident. Symptoms were milder than her previous exacerbation, so plasmapheresis was not felt to be needed. She was able to ambulate with some using walker so we'll plan to get home physical therapy to follow her as well.  Overall stable and ready to be discharged on 1/13. Active Problems:  IBS (irritable bowel syndrome)  Depression: Stable continued on antidepressants.  GERD (gastroesophageal reflux disease): Stable continue on PPI  Obesity   Procedures:  None  Consultations:  None  Discharge Exam: Filed Vitals:   02/16/12 0504 02/16/12 1521 02/16/12 2134 02/17/12 0500  BP: 154/93 134/82 123/73 140/75  Pulse: 55 68 67 53  Temp: 98 F (36.7 C) 98.2 F (36.8 C) 97.9 F (36.6 C) 98 F (36.7 C)  TempSrc: Oral Oral Oral Oral  Resp: 18 20 16 18   Height:      Weight:      SpO2: 100% 100% 99% 99%    General: A&O x 3 Cardiovascular: RRR S1S2 Respiratory: CTA Bilaterally  Discharge Instructions  Discharge Orders    Future Orders Please Complete By Expires   Diet general      Increase activity slowly          Medication List     As of 02/17/2012  4:20 PM    TAKE these medications         amphetamine-dextroamphetamine 15 MG tablet   Commonly known as: ADDERALL   Take 15 mg by mouth daily.      baclofen 10 MG tablet   Commonly known as: LIORESAL   Take 10 mg by mouth 2 (two) times daily.      drospirenone-ethinyl estradiol 3-0.02 MG tablet   Commonly known as: YAZ,GIANVI,LORYNA   Take 1 tablet by mouth daily.      DULoxetine 30 MG capsule   Commonly known as: CYMBALTA   Take 30 mg  by mouth at bedtime.      fentaNYL 25 MCG/HR   Commonly known as: DURAGESIC - dosed mcg/hr   Place 1 patch onto the skin every 3 (three) days.      HYDROcodone-acetaminophen 7.5-325 MG per tablet   Commonly known as: NORCO   Take 2 tablets by mouth every 6 (six) hours as needed. PAIN      lactulose 10 GM/15ML solution   Commonly known as: CHRONULAC   Take 20 g by mouth 2 (two) times daily as needed. constipation      omeprazole 40 MG capsule   Commonly known as: PRILOSEC   Take 40 mg by mouth 2 (two) times daily.      oxyCODONE 5 MG immediate release tablet   Commonly known as: Oxy  IR/ROXICODONE   Take 1 tablet (5 mg total) by mouth every 4 (four) hours as needed for pain.      predniSONE 20 MG tablet   Commonly known as: DELTASONE   Take 10-200 mg by mouth See admin instructions. 10tabs x 5days, 5tabs x 2days, 3tabs x 2days, 2tabs x 2days, 1tab x 2days, 1/2tab x 2 days      pregabalin 300 MG capsule   Commonly known as: LYRICA   Take 300 mg by mouth at bedtime.      PROZAC 20 MG capsule   Generic drug: FLUoxetine   Take 20 mg by mouth daily.      TECFIDERA 240 MG Cpdr   Generic drug: Dimethyl Fumarate   Take 240 mg by mouth 2 (two) times daily.          The results of significant diagnostics from this hospitalization (including imaging, microbiology, ancillary and laboratory) are listed below for reference.    Significant Diagnostic Studies: Dg Chest 2 View  02/05/2012  IMPRESSION: No active cardiopulmonary abnormalities.   Original Report Authenticated By: Signa Kell, M.D.    Ct Head Wo Contrast  02/05/2012  .  IMPRESSION: Negative noncontrast CT scan of the head.   Original Report Authenticated By: Malachy Moan, M.D.    Dg Foot Complete Right  02/05/2012  .  IMPRESSION: Negative exam.     Labs: Basic Metabolic Panel:  Lab 02/14/12 5621  NA 136  K 3.7  CL 99  CO2 29  GLUCOSE 98  BUN 13  CREATININE 0.76  CALCIUM 9.2  MG --  PHOS --   CBC:  Lab 02/14/12 2345  WBC 14.0*  NEUTROABS 10.0*  HGB 13.1  HCT 36.4  MCV 79.1  PLT 247      Signed:  Naiomy Watters K  [facility] 02/17/2012, 4:20 PM

## 2012-05-04 ENCOUNTER — Other Ambulatory Visit: Payer: Self-pay | Admitting: Gastroenterology

## 2012-05-04 DIAGNOSIS — R109 Unspecified abdominal pain: Secondary | ICD-10-CM

## 2012-05-12 ENCOUNTER — Encounter (HOSPITAL_COMMUNITY)
Admission: RE | Admit: 2012-05-12 | Discharge: 2012-05-12 | Disposition: A | Discharge status: Home / Self Care | Payer: Medicaid Other | Source: Ambulatory Visit | Attending: Gastroenterology | Admitting: Gastroenterology

## 2012-05-12 DIAGNOSIS — R109 Unspecified abdominal pain: Secondary | ICD-10-CM | POA: Insufficient documentation

## 2012-05-12 DIAGNOSIS — R141 Gas pain: Secondary | ICD-10-CM | POA: Insufficient documentation

## 2012-05-12 DIAGNOSIS — R142 Eructation: Secondary | ICD-10-CM | POA: Insufficient documentation

## 2012-05-12 DIAGNOSIS — R6881 Early satiety: Secondary | ICD-10-CM | POA: Insufficient documentation

## 2012-05-12 MED ORDER — TECHNETIUM TC 99M SULFUR COLLOID
2.0000 | Freq: Once | INTRAVENOUS | Status: AC | PRN
  Administered 2012-05-12: 2 via INTRAVENOUS

## 2014-03-10 ENCOUNTER — Encounter: Payer: Self-pay | Admitting: Neurology

## 2014-03-10 ENCOUNTER — Ambulatory Visit (INDEPENDENT_AMBULATORY_CARE_PROVIDER_SITE_OTHER): Payer: 59 | Admitting: Neurology

## 2014-03-10 VITALS — BP 136/72 | HR 80 | Resp 14 | Ht 65.0 in | Wt 224.0 lb

## 2014-03-10 DIAGNOSIS — F32A Depression, unspecified: Secondary | ICD-10-CM

## 2014-03-10 DIAGNOSIS — R5382 Chronic fatigue, unspecified: Secondary | ICD-10-CM

## 2014-03-10 DIAGNOSIS — G4710 Hypersomnia, unspecified: Secondary | ICD-10-CM | POA: Diagnosis not present

## 2014-03-10 DIAGNOSIS — R208 Other disturbances of skin sensation: Secondary | ICD-10-CM | POA: Insufficient documentation

## 2014-03-10 DIAGNOSIS — R350 Frequency of micturition: Secondary | ICD-10-CM | POA: Diagnosis not present

## 2014-03-10 DIAGNOSIS — H469 Unspecified optic neuritis: Secondary | ICD-10-CM

## 2014-03-10 DIAGNOSIS — G4730 Sleep apnea, unspecified: Secondary | ICD-10-CM

## 2014-03-10 DIAGNOSIS — R260 Ataxic gait: Secondary | ICD-10-CM | POA: Diagnosis not present

## 2014-03-10 DIAGNOSIS — G35 Multiple sclerosis: Secondary | ICD-10-CM

## 2014-03-10 DIAGNOSIS — F329 Major depressive disorder, single episode, unspecified: Secondary | ICD-10-CM

## 2014-03-10 DIAGNOSIS — R0683 Snoring: Secondary | ICD-10-CM | POA: Diagnosis not present

## 2014-03-10 MED ORDER — FENTANYL 25 MCG/HR TD PT72: 25.0000 ug | MEDICATED_PATCH | TRANSDERMAL | Status: DC

## 2014-03-10 NOTE — Progress Notes (Signed)
[facility]  PATIENT: Joy Cobb DOB: 01/08/1981  REFERRING CLINICIAN: Greggory Stallion Osei-Bonsu  HISTORY FROM: [name] REASON FOR VISIT: MS and related symptoms   HISTORICAL  CHIEF COMPLAINT:  Chief Complaint  Patient presents with  . Multiple Cobb    Sts. she tolerates Tecfidera well.  She thinks left leg is some weaker./fim    HISTORY OF PRESENT ILLNESS:  Joy Cobb is a 34 year old woman who was diagnosed with multiple Cobb Joy April 2012 after her second episode of optic neuritis. Her previous episode was about 7 or 8 months earlier and an MRI of the brain at that time did not show changes consistent with MS. After her second episode she had another MRI which showed some changes Joy the brain consistent with MS. She received IV steroids for the second optic neuritis.  First episode was left and second episode was right.   Joy Cobb.   Joy was initially placed on Betaseron. However, she continued to have exacerbations. Additionally, she had difficulty tolerating it well. Joy 2012, she had 3 other exacerbations while on Betaseron. At least some of them involved her spine and she had evidence on MRI and bilateral changes Joy her legs on exam. Due to the aggressiveness of the MS, she received 8 courses of IV Cytoxan while she was living [place].  She then was switched to Tecfidera Joy 2013 and remains on that medication.  She moved to [facility] Joy 2013 and started to see Dr. Leotis Shames. When Joy moved further away, she transferred her care to me. Joy 2014, she had some worsening gait that was felt to be due to an exacerbation that she receive a course of steroids. She has not had any further exacerbations while on Tecfidera.  Her last MRI was about 1 year ago at [facility].  Several symptoms related to her multiple Cobb. She has left greater than right leg weakness that  is usually mild. She notes tingling sometimes with pain Joy the feet. Her gait is mildly off but when she is tired her gait is worse. She has mild spasticity Joy her legs that has been helped by the baclofen. She notes pain that is predominantly Joy the lower half of her body, mostly Joy the legs. Pain is achy.   She takes Lyrica and is also on fentanyl for the pain.  She denies any current problems with her eyes and feels that she recovered well from the optic neuritis.  She notes some bladder frequency and urgency. She had been prescribed location Joy the past but she does not take it on a regular basis because this problem is not as bad as it had been Joy the past.  She also has difficulty with fatigue. She wakes up feeling somewhat tired but feels a little bit better later Joy the morning. Then, later Joy the afternoon she will feel more tired again. Her fatigue is more physical and cognitive. She notes that her fatigue is a little bit better since starting phentermine and she has been able to lose 9 pounds while taking that medication as well.    She snores. She notes if she sleeps on her back that she has difficulty breathing and will wake up gasping. We had discussed a sleep study Joy the past and I will order one while she is here.  She notes that she has mild cognitive difficulties. Specifically, short-term memory, attention, and word  finding is sometimes impaired. She'll sometimes read a paragraph and not remember what she had just read.     She notes some mood disturbance, specifically depression. She is tearful at times. She feels she gets some benefit from Prozac but it is not helping her completely. She did not note any better benefit from Cymbalta, earlier. Joy notes little anxiety and it is better than her depression.  EPWORTH SLEEPINESS SCALE  On a scale of 0 - 3 what is the chance of dozing:  Sitting and Reading:   3 Watching TV:    2 Sitting inactive Joy a public place: 2 Passenger Joy car  for one hour: 1 Lying down to rest Joy the afternoon: 2 Sitting and talking to someone: 0 Sitting quietly after lunch:  1    Joy a car, stopped Joy traffic:  0  Total (out of 24):     11  /  24   REVIEW OF SYSTEMS:  Constitutional: No fevers, chills, sweats, or change Joy appetite Eyes: No visual changes, double vision, eye pain Ear, nose and throat: No hearing loss, ear pain, nasal congestion, sore throat Cardiovascular: No chest pain, palpitations Respiratory:  No shortness of breath at rest or with exertion.   No wheezes GastrointestinaI: Sh notes constipation.  No nausea, vomiting, diarrhea, abdominal pain, fecal incontinence Genitourinary:  Some urgency and frequency.. Musculoskeletal:  No neck pain, back pain Integumentary: No rash, pruritus, skin lesions Neurological: as above Psychiatric: see above Endocrine: No palpitations, diaphoresis, change Joy appetite, change Joy weigh or increased thirst Hematologic/Lymphatic:  No anemia, purpura, petechiae. Allergic/Immunologic: No itchy/runny eyes, nasal congestion, recent allergic reactions, rashes  ALLERGIES: Allergies  Allergen Reactions  . Amoxicillin   . Penicillins Hives  . Temazepam     Sharp pains Joy stomach  . Septra [Sulfamethoxazole-Trimethoprim] Rash    HOME MEDICATIONS: Outpatient Prescriptions Prior to Visit  Medication Sig Dispense Refill  . amphetamine-dextroamphetamine (ADDERALL) 15 MG tablet Take 15 mg by mouth daily.    . baclofen (LIORESAL) 10 MG tablet Take 10 mg by mouth 2 (two) times daily.     . Dimethyl Fumarate (TECFIDERA) 240 MG CPDR Take 240 mg by mouth 2 (two) times daily.    . drospirenone-ethinyl estradiol (YAZ,GIANVI,LORYNA) 3-0.02 MG tablet Take 1 tablet by mouth daily.    . fentaNYL (DURAGESIC - DOSED MCG/HR) 25 MCG/HR Place 1 patch onto the skin every 3 (three) days.    Marland Kitchen FLUoxetine (PROZAC) 20 MG capsule Take 20 mg by mouth daily.    . pregabalin (LYRICA) 300 MG capsule Take 300 mg by mouth  at bedtime.     . DULoxetine (CYMBALTA) 30 MG capsule Take 30 mg by mouth at bedtime.     Marland Kitchen HYDROcodone-acetaminophen (NORCO) 7.5-325 MG per tablet Take 2 tablets by mouth every 6 (six) hours as needed. PAIN    . lactulose (CHRONULAC) 10 GM/15ML solution Take 20 g by mouth 2 (two) times daily as needed. constipation    . omeprazole (PRILOSEC) 40 MG capsule Take 40 mg by mouth 2 (two) times daily.    Marland Kitchen oxyCODONE (ROXICODONE) 5 MG immediate release tablet Take 1 tablet (5 mg total) by mouth every 4 (four) hours as needed for pain. (Patient not taking: Reported on 03/10/2014) 20 tablet 0  . predniSONE (DELTASONE) 20 MG tablet Take 10-200 mg by mouth See admin instructions. 10tabs x 5days, 5tabs x 2days, 3tabs x 2days, 2tabs x 2days, 1tab x 2days, 1/2tab x 2 days  No facility-administered medications prior to visit.    PAST MEDICAL HISTORY: Past Medical History  Diagnosis Date  . Multiple Cobb   . IBS (irritable bowel syndrome)   . Colonic inertia   . Renal disorder   . Bladder spasms   . Vision abnormalities   . Neuropathy     PAST SURGICAL HISTORY: Past Surgical History  Procedure Laterality Date  . Breast surgery    . Cystectomy    . Polyp removal      FAMILY HISTORY: Family History  Problem Relation Age of Onset  . Neuropathy Mother   . Leukemia Father     SOCIAL HISTORY:  History   Social History  . Marital Status: Single    Spouse Name: N/A    Number of Children: N/A  . Years of Education: N/A   Occupational History  . Not on file.   Social History Main Topics  . Smoking status: Never Smoker   . Smokeless tobacco: Never Used  . Alcohol Use: No  . Drug Use: No  . Sexual Activity: Not on file   Other Topics Concern  . Not on file   Social History Narrative     PHYSICAL EXAM  Filed Vitals:   03/10/14 1126  BP: 136/72  Pulse: 80  Resp: 14  Height:  (1.651 m)  Weight: 224 lb (101.606 kg)    Body mass index is 37.28  kg/(m^2).   General: The patient is well-developed and well-nourished and Joy no acute distress  Eyes:  Funduscopic exam shows normal optic discs and retinal vessels.  Neck: The neck is supple, no carotid bruits are noted.  The neck is nontender.  Respiratory: The respiratory examination is clear.  Cardiovascular: The cardiovascular examination reveals a regular rate and rhythm, no murmurs, gallops or rubs are noted.  Skin: Extremities are without significant edema.  Neurologic Exam  Mental status: The patient is alert and oriented x 3 at the time of the examination. The patient has apparent normal recent and remote memory, with an apparently normal attention span and concentration ability.   Speech is normal.  Cranial nerves: Extraocular movements are full. Pupils show mild right APD.  Visual fields are full.  Mild right color desaturation good acuity.   Facial symmetry is present. There is good facial sensation to soft touch bilaterally.Facial strength is normal.  Trapezius and sternocleidomastoid strength is normal. No dysarthria is noted.  The tongue is midline, and the patient has symmetric elevation of the soft palate. No obvious hearing deficits are noted.  Motor:  Muscle bulk is normal. Tone is slightly increased Joy both legs.. Strength is  5 / 5 Joy all 4 extremities.   Sensory: Sensory testing is intact to pinprick, soft touch, vibration sensation, and position sense on all 4 extremities.  Coordination: Cerebellar testing reveals good finger-nose-finger and heel-to-shin bilaterally.  Gait and station: Station is normal. Gait is slightly wide. Her tandem gait is mildly wide.. Romberg is negative.   Reflexes: Deep tendon reflexes are symmetric and normal bilaterally. Plantar responses are normal.    DIAGNOSTIC DATA (LABS, IMAGING, TESTING) - I reviewed patient records, labs, notes, testing and imaging myself where available.  Lab Results  Component Value Date   WBC 14.0*  02/14/2012   HGB 13.1 02/14/2012   HCT 36.4 02/14/2012   MCV 79.1 02/14/2012   PLT 247 02/14/2012      Component Value Date/Time   NA 136 02/14/2012 2345   K 3.7 02/14/2012 2345  CL 99 02/14/2012 2345   CO2 29 02/14/2012 2345   GLUCOSE 98 02/14/2012 2345   BUN 13 02/14/2012 2345   CREATININE 0.76 02/14/2012 2345   CALCIUM 9.2 02/14/2012 2345   PROT 7.5 02/05/2012 1913   ALBUMIN 3.5 02/05/2012 1913   AST 16 02/05/2012 1913   ALT 11 02/05/2012 1913   ALKPHOS 77 02/05/2012 1913   BILITOT 0.2* 02/05/2012 1913   GFRNONAA >90 02/14/2012 2345   GFRAA >90 02/14/2012 2345    Lab Results  Component Value Date   TSH 0.246* 02/15/2012     ASSESSMENT AND PLAN   Multiple Cobb  Chronic fatigue  Depression  Hypersomnia with sleep apnea - Plan: Split night study  Snores - Plan: Split night study  Dysesthesia  Ataxic gait  Optic neuritis  Urinary frequency   Joy Cobb, Joy Cobb is a 34 year old woman with multiple Cobb who has mild gait disturbance due to mild spasticity ataxia Joy her legs. She has done fairly well on Tecfidera with one possible exacerbation almost 2 years ago. We'll continue the Tecfidera she has several rooms until probably associated with the MS including dysesthesias that are well controlled on her medications. She also has fatigue that is partially helped by phentermine. Adderall was not well tolerated. She reports snoring and waking up gasping for air. She most likely has obstructive sleep apnea and she also has mild hypersomnia. Therefore, we will obtain a split-night sleep study to better characterize this. CPAP or oral appliance will be considered if she has enough sleep apnea to treat.  She will return to see me Joy 4 months or sooner if she has new or worsening neurologic symptoms   Alverta Caccamo A. Epimenio Foot, MD, PhD 03/10/2014, 11:44 AM Certified Joy Neurology, Clinical Neurophysiology, Sleep Medicine, Pain Medicine and  Neuroimaging  [facility] [address] [place], [place] [address] [phone]

## 2014-04-11 ENCOUNTER — Ambulatory Visit: Payer: Self-pay | Admitting: Neurology

## 2014-04-12 ENCOUNTER — Other Ambulatory Visit: Payer: Self-pay | Admitting: Neurology

## 2014-04-12 ENCOUNTER — Ambulatory Visit (INDEPENDENT_AMBULATORY_CARE_PROVIDER_SITE_OTHER): Payer: Self-pay | Admitting: Neurology

## 2014-04-12 VITALS — BP 114/82

## 2014-04-12 DIAGNOSIS — G4710 Hypersomnia, unspecified: Secondary | ICD-10-CM

## 2014-04-12 DIAGNOSIS — G4730 Sleep apnea, unspecified: Secondary | ICD-10-CM

## 2014-04-12 DIAGNOSIS — R0683 Snoring: Secondary | ICD-10-CM

## 2014-04-12 DIAGNOSIS — Z0289 Encounter for other administrative examinations: Secondary | ICD-10-CM

## 2014-04-12 DIAGNOSIS — G4752 REM sleep behavior disorder: Secondary | ICD-10-CM

## 2014-04-12 MED ORDER — FENTANYL 25 MCG/HR TD PT72: 25.0000 ug | MEDICATED_PATCH | TRANSDERMAL | Status: DC

## 2014-04-12 NOTE — Telephone Encounter (Signed)
Patient is calling for written Rx FentaNYL 25 mcg/hr patch. [facility] call.

## 2014-04-13 NOTE — Sleep Study (Signed)
Please see the scanned sleep study interpretation located in the Procedure tab within the Chart Review section. 

## 2014-04-14 ENCOUNTER — Telehealth: Payer: Self-pay

## 2014-04-14 DIAGNOSIS — G4710 Hypersomnia, unspecified: Secondary | ICD-10-CM | POA: Insufficient documentation

## 2014-04-14 NOTE — Telephone Encounter (Signed)
Called patient and informed Rx ready for pick up at front desk. Patient verbalized understanding.  

## 2014-04-20 ENCOUNTER — Encounter (INDEPENDENT_AMBULATORY_CARE_PROVIDER_SITE_OTHER): Payer: 59 | Admitting: Neurology

## 2014-04-20 ENCOUNTER — Telehealth: Payer: Self-pay | Admitting: *Deleted

## 2014-04-20 DIAGNOSIS — G4710 Hypersomnia, unspecified: Secondary | ICD-10-CM

## 2014-04-20 DIAGNOSIS — G4730 Sleep apnea, unspecified: Principal | ICD-10-CM

## 2014-04-20 NOTE — Telephone Encounter (Signed)
Patient was contacted and notified that the results of her sleep study did not reveal any significant sleep apnea or parasomnia activity.  She was informed that no treatment was advised at this time.  She was notified to continue to follow up with Dr. Epimenio Foot as previously scheduled.  Dr. Greggory Stallion Osei-Bonsu was faxed a copy of the report.

## 2014-04-21 ENCOUNTER — Telehealth: Payer: Self-pay | Admitting: *Deleted

## 2014-04-21 NOTE — Telephone Encounter (Signed)
Spoke with Sola and per RAS, advised no osa noted on sleep study.  She verbalized understanding of same, sts. will discuss further at f/u visit/fim

## 2014-04-21 NOTE — Telephone Encounter (Signed)
-----   Message from Asa Lente, MD sent at 04/20/2014  6:27 PM EDT ----- Please let her know there was no sleep apnea on sleep study

## 2014-05-11 ENCOUNTER — Telehealth: Payer: Self-pay | Admitting: Neurology

## 2014-05-11 MED ORDER — FENTANYL 25 MCG/HR TD PT72: 25.0000 ug | MEDICATED_PATCH | TRANSDERMAL | Status: DC

## 2014-05-11 NOTE — Telephone Encounter (Signed)
Rx. post-dated for due date of 05-12-14, printed, signed, up front [facility].  I spoke with pt. and let her know rx. is ready.Joy Cobb

## 2014-05-11 NOTE — Telephone Encounter (Signed)
Patient requesting refill for Rx fentaNYL (DURAGESIC - DOSED MCG/HR) 25 MCG/HR patch.  Please call when ready for pick up.

## 2014-06-14 ENCOUNTER — Telehealth: Payer: Self-pay | Admitting: Neurology

## 2014-06-14 MED ORDER — FENTANYL 25 MCG/HR TD PT72: 25.0000 ug | MEDICATED_PATCH | TRANSDERMAL | Status: DC

## 2014-06-14 NOTE — Telephone Encounter (Signed)
Patient called requesting a refill for fentaNYL (DURAGESIC - DOSED MCG/HR) 25 MCG/HR patch. Patient was advised that the script will be ready for pick up in 24hrs unless otherwise advised by the nurse. Patient can be reached @ [phone]

## 2014-06-14 NOTE — Telephone Encounter (Signed)
Fentanyl last r/f 05-11-14.  Rx. printed, signed, up front [facility]/fim

## 2014-07-01 ENCOUNTER — Other Ambulatory Visit: Payer: Self-pay | Admitting: Neurology

## 2014-07-05 ENCOUNTER — Telehealth: Payer: Self-pay

## 2014-07-05 MED ORDER — FLUOXETINE HCL 20 MG PO CAPS: 20.0000 mg | ORAL_CAPSULE | Freq: Three times a day (TID) | ORAL | Status: DC

## 2014-07-05 NOTE — Telephone Encounter (Signed)
[facility] [address] is requesting a Rx for Fluoxetine 20mg  with instructions of one three times daily.  Thank you.

## 2014-07-05 NOTE — Telephone Encounter (Signed)
I have spoken with Moira and confirmed she is taking Prozac 2-3 times daily.  Per RAS, this is appropriate.  [facility]/fim

## 2014-07-12 ENCOUNTER — Ambulatory Visit: Payer: 59 | Admitting: Neurology

## 2014-07-14 ENCOUNTER — Other Ambulatory Visit: Payer: Self-pay | Admitting: Neurology

## 2014-07-14 MED ORDER — LYRICA 150 MG PO CAPS: ORAL_CAPSULE | ORAL | Status: DC

## 2014-07-14 MED ORDER — FENTANYL 25 MCG/HR TD PT72: 25.0000 ug | MEDICATED_PATCH | TRANSDERMAL | Status: DC

## 2014-07-14 NOTE — Telephone Encounter (Signed)
Patient called requesting refill for LYRICA 150 MG capsule and fentaNYL (DURAGESIC - DOSED MCG/HR) 25 MCG/HR patch . Patient can be reached at [phone]. Patient advised RX will be ready within 24 hours unless otherwise informed from the RN.

## 2014-07-14 NOTE — Telephone Encounter (Signed)
There are no instructions for Lyrica on med list.  I called the pharmacy to clarify instructions.  Spoke with pharmacist who verified Rx was written by Dr Epimenio Foot for 150mg  caps one in am and two in hs.  Rx requests have been entered, forwarded to provider for approval.

## 2014-07-27 ENCOUNTER — Other Ambulatory Visit: Payer: Self-pay | Admitting: Neurology

## 2014-08-10 ENCOUNTER — Encounter: Payer: Self-pay | Admitting: Neurology

## 2014-08-10 ENCOUNTER — Ambulatory Visit (INDEPENDENT_AMBULATORY_CARE_PROVIDER_SITE_OTHER): Payer: Medicare HMO | Admitting: Neurology

## 2014-08-10 VITALS — BP 128/84 | HR 76 | Resp 14 | Ht 65.0 in | Wt 205.0 lb

## 2014-08-10 DIAGNOSIS — F329 Major depressive disorder, single episode, unspecified: Secondary | ICD-10-CM

## 2014-08-10 DIAGNOSIS — G35 Multiple sclerosis: Secondary | ICD-10-CM

## 2014-08-10 DIAGNOSIS — R5382 Chronic fatigue, unspecified: Secondary | ICD-10-CM

## 2014-08-10 DIAGNOSIS — G4710 Hypersomnia, unspecified: Secondary | ICD-10-CM

## 2014-08-10 DIAGNOSIS — R350 Frequency of micturition: Secondary | ICD-10-CM

## 2014-08-10 DIAGNOSIS — G4730 Sleep apnea, unspecified: Secondary | ICD-10-CM | POA: Diagnosis not present

## 2014-08-10 DIAGNOSIS — R260 Ataxic gait: Secondary | ICD-10-CM

## 2014-08-10 DIAGNOSIS — R208 Other disturbances of skin sensation: Secondary | ICD-10-CM | POA: Diagnosis not present

## 2014-08-10 DIAGNOSIS — F32A Depression, unspecified: Secondary | ICD-10-CM

## 2014-08-10 MED ORDER — LYRICA 150 MG PO CAPS: ORAL_CAPSULE | ORAL | Status: DC

## 2014-08-10 MED ORDER — FENTANYL 25 MCG/HR TD PT72: 25.0000 ug | MEDICATED_PATCH | TRANSDERMAL | Status: DC

## 2014-08-10 MED ORDER — PHENTERMINE HCL 37.5 MG PO TABS: 37.5000 mg | ORAL_TABLET | Freq: Every day | ORAL | Status: DC

## 2014-08-10 MED ORDER — ETODOLAC 400 MG PO TABS: 400.0000 mg | ORAL_TABLET | Freq: Two times a day (BID) | ORAL | Status: DC

## 2014-08-10 NOTE — Progress Notes (Signed)
[facility]  PATIENT: Joy Cobb DOB: 03/23/1980  REFERRING CLINICIAN: Greggory Stallion Osei-Bonsu  HISTORY FROM: [name] REASON FOR VISIT: MS and related symptoms   HISTORICAL  CHIEF COMPLAINT:  Chief Complaint  Patient presents with  . Multiple Sclerosis    Sts. she continues to tolerate Tecfidera well.  Sts. she has been getting small blisters on the sides and bottoms of her feet--has seen pcp for same, and hasn't gotten any better, so wonders if she needs a referral to podiatry.  Sts. lower back pain is radiating into bilat hips and down sides of legs to feet--she would like to discuss pain management.  She also sts. she has been under more stress than usual, feels more depressed, despite antidepressants, and would like a referral to a counselor.  She   . Rash    reports new numbness on the left side of her face over the last couple of days.  Sts. she sometimes misses one am dose of Tecfidera per week, but doesn't usually miss more doses than that.  She also c/o intermittent dizziness with turning head./fim  . Depression  . Dizziness    HISTORY OF PRESENT ILLNESS:  Joy Cobb is a 45 with MS.  MS History:   -year-old woman who was diagnosed with multiple sclerosis in April 2012 after her second episode of optic neuritis. Her previous episode was about 7 or 8 months earlier and an MRI of the brain at that time did not show changes consistent with MS. After her second episode she had another MRI which showed some changes in the brain consistent with MS. She received IV steroids for the second optic neuritis.  First episode was left and second episode was right.   He also received a lumbar puncture and the CSF was consistent with Multiple sclerosis.   He was initially placed on Betaseron. However, she continued to have exacerbations. Additionally, she had difficulty tolerating it well. In 2012, she had 3 other exacerbations while on Betaseron. At least some of them  involved her spine and she had evidence on MRI and bilateral changes in her legs on exam. Due to the aggressiveness of the MS, she received 8 courses of IV Cytoxan while she was living in [place].  She then was switched to Tecfidera in 2013 and remains on that medication.  She moved to [facility] in 2013 and started to see Dr. Leotis Shames. When he moved further away, she transferred her care to me. In 2014, she had some worsening gait that was felt to be due to an exacerbation that she receive a course of steroids. She has not had any further exacerbations while on Tecfidera.  Her last MRI was early 2015 at [facility].  Gait/strength/sensation:   She reports left greater than right leg weakness that is usually mild. She notes tingling sometimes with pain in the feet. Her gait is mildly off but when she is tired her gait is worse. She has mild spasticity in her legs that has been helped by the baclofen. She notes pain  mostly in the legs and back. Pain is achy.   She takes Lyrica and is also on fentanyl for the pain.  Vision:   She denies any current problems with her eyes and feels that she recovered well from the optic neuritis.    She notes light sensitivity at night (headlights especially)  Bladder:  She notes some bladder frequency and urgency. She had been prescribed location in the past but she  does not take it on a regular basis because this problem is not as bad as it had been in the past.  Mood/cognitive:   She notes more depression this year.    She feels very stressed and notes crying spells.  She denies suicide ideation or plan.   She is not sure if she gets anxiety.   She has been on Prozac a couple years and is not sure it is helping her anymore.    She did not note any better benefit from Cymbalta, earlier.    She notes short-term memory, attention, and word finding is sometimes impaired. She'll sometimes read a paragraph and not remember what she had just read.     Fatigue/sleep:   She has difficulty with fatigue and excessive sleepiness.   We started phentermine daily earlier this year and she lost 29 pounds.   She feels it helps her fatigue a bit also.   She snores but PSG did not show any significant OSA or other sleep disturbance.      EPWORTH SLEEPINESS SCALE  On a scale of 0 - 3 what is the chance of dozing:  Sitting and Reading:   3 Watching TV:    3 Sitting inactive in a public place: 2 Passenger in car for one hour: 1 Lying down to rest in the afternoon: 2 Sitting and talking to someone: 0 Sitting quietly after lunch:  1    In a car, stopped in traffic:  0  Total (out of 24):     12  /  24   REVIEW OF SYSTEMS:  Constitutional: No fevers, chills, sweats, or change in appetite.   Lost 29 pounds Eyes: No visual changes, double vision, eye pain Ear, nose and throat: No hearing loss, ear pain, nasal congestion, sore throat Cardiovascular: No chest pain, palpitations Respiratory:  No shortness of breath at rest or with exertion.   Snores but no OSA GastrointestinaI: Sh notes constipation.  No nausea, vomiting, diarrhea, abdominal pain, fecal incontinence Genitourinary:  Some urgency and frequency.. Musculoskeletal:  No neck pain, back pain Integumentary: No rash, pruritus, skin lesions Neurological: as above Psychiatric: see above Endocrine: No palpitations, diaphoresis, change in appetite, or increased thirst Hematologic/Lymphatic:  No anemia, purpura, petechiae. Allergic/Immunologic: No itchy/runny eyes, nasal congestion, recent allergic reactions, rashes  ALLERGIES: Allergies  Allergen Reactions  . Amoxicillin   . Penicillins Hives  . Temazepam     Sharp pains in stomach  . Septra [Sulfamethoxazole-Trimethoprim] Rash    HOME MEDICATIONS: Outpatient Prescriptions Prior to Visit  Medication Sig Dispense Refill  . AFLURIA PRESERVATIVE FREE 0.5 ML SUSY   0  . baclofen (LIORESAL) 10 MG tablet Take 1 tablet (10 mg total) by mouth 3 (three) times  daily. 90 tablet 0  . CARAFATE 1 GM/10ML suspension   3  . DEXILANT 60 MG capsule     . Dimethyl Fumarate (TECFIDERA) 240 MG CPDR Take 240 mg by mouth 2 (two) times daily.    . drospirenone-ethinyl estradiol (YAZ,GIANVI,LORYNA) 3-0.02 MG tablet Take 1 tablet by mouth daily.    . fentaNYL (DURAGESIC - DOSED MCG/HR) 25 MCG/HR patch Place 1 patch (25 mcg total) onto the skin every 3 (three) days. 10 patch 0  . FLUoxetine (PROZAC) 20 MG capsule Take 1 capsule (20 mg total) by mouth 3 (three) times daily. 90 capsule 3  . Ibuprofen 200 MG CAPS     . LYRICA 150 MG capsule One in the morning and two at bedtime 90  capsule 3  . metoCLOPramide (REGLAN) 5 MG tablet   11  . DULoxetine (CYMBALTA) 30 MG capsule Take 30 mg by mouth at bedtime.     Marland Kitchen lactulose (CHRONULAC) 10 GM/15ML solution Take 20 g by mouth 2 (two) times daily as needed. constipation    . phentermine (ADIPEX-P) 37.5 MG tablet   3  . amphetamine-dextroamphetamine (ADDERALL) 15 MG tablet Take 15 mg by mouth daily.    Marland Kitchen HYDROcodone-acetaminophen (NORCO) 7.5-325 MG per tablet Take 2 tablets by mouth every 6 (six) hours as needed. PAIN    . omeprazole (PRILOSEC) 40 MG capsule Take 40 mg by mouth 2 (two) times daily.    Marland Kitchen oxyCODONE (ROXICODONE) 5 MG immediate release tablet Take 1 tablet (5 mg total) by mouth every 4 (four) hours as needed for pain. (Patient not taking: Reported on 03/10/2014) 20 tablet 0  . predniSONE (DELTASONE) 20 MG tablet Take 10-200 mg by mouth See admin instructions. 10tabs x 5days, 5tabs x 2days, 3tabs x 2days, 2tabs x 2days, 1tab x 2days, 1/2tab x 2 days     No facility-administered medications prior to visit.    PAST MEDICAL HISTORY: Past Medical History  Diagnosis Date  . Multiple sclerosis   . IBS (irritable bowel syndrome)   . Colonic inertia   . Renal disorder   . Bladder spasms   . Vision abnormalities   . Neuropathy     PAST SURGICAL HISTORY: Past Surgical History  Procedure Laterality Date  . Breast  surgery    . Cystectomy    . Polyp removal      FAMILY HISTORY: Family History  Problem Relation Age of Onset  . Neuropathy Mother   . Leukemia Father     SOCIAL HISTORY:  History   Social History  . Marital Status: Single    Spouse Name: N/A  . Number of Children: N/A  . Years of Education: N/A   Occupational History  . Not on file.   Social History Main Topics  . Smoking status: Never Smoker   . Smokeless tobacco: Never Used  . Alcohol Use: No  . Drug Use: No  . Sexual Activity: Not on file   Other Topics Concern  . Not on file   Social History Narrative     PHYSICAL EXAM  Filed Vitals:   08/10/14 1429  BP: 128/84  Pulse: 76  Resp: 14  Height: 5\' 5"  (1.651 m)  Weight: 205 lb (92.987 kg)    Body mass index is 34.11 kg/(m^2).   General: The patient is well-developed and well-nourished and in no acute distress  Eyes:  Funduscopic exam shows normal optic discs and retinal vessels.  Neck: The neck is supple, no carotid bruits are noted.  The neck is nontender.  Respiratory: The respiratory examination is clear.  Cardiovascular: The cardiovascular examination reveals a regular rate and rhythm, no murmurs, gallops or rubs are noted.  Skin: Extremities are without significant edema.  Neurologic Exam  Mental status: The patient is alert and oriented x 3 at the time of the examination. The patient has apparent normal recent and remote memory, with an apparently normal attention span and concentration ability.   Speech is normal.  Cranial nerves: Extraocular movements are full. Pupils show mild right APD.  Visual fields are full.  Mild right color desaturation good acuity.   Facial symmetry is present. There is good facial sensation to soft touch bilaterally.Facial strength is normal.  Trapezius and sternocleidomastoid strength is normal. No dysarthria  is noted.  The tongue is midline, and the patient has symmetric elevation of the soft palate. No obvious  hearing deficits are noted.  Motor:  Muscle bulk is normal. Tone is slightly increased in both legs.. Strength is  5 / 5 in all 4 extremities.   Sensory: Sensory testing is intact to pinprick, soft touch, vibration sensation, and position sense on all 4 extremities.  Coordination: Cerebellar testing reveals good finger-nose-finger and heel-to-shin bilaterally.  Gait and station: Station is normal. Gait is slightly wide. Her tandem gait is mildly wide.. Romberg is negative.   Reflexes: Deep tendon reflexes are symmetric and normal bilaterally. Plantar responses are normal.    DIAGNOSTIC DATA (LABS, IMAGING, TESTING) - I reviewed patient records, labs, notes, testing and imaging myself where available.  Lab Results  Component Value Date   WBC 14.0* 02/14/2012   HGB 13.1 02/14/2012   HCT 36.4 02/14/2012   MCV 79.1 02/14/2012   PLT 247 02/14/2012      Component Value Date/Time   NA 136 02/14/2012 2345   K 3.7 02/14/2012 2345   CL 99 02/14/2012 2345   CO2 29 02/14/2012 2345   GLUCOSE 98 02/14/2012 2345   BUN 13 02/14/2012 2345   CREATININE 0.76 02/14/2012 2345   CALCIUM 9.2 02/14/2012 2345   PROT 7.5 02/05/2012 1913   ALBUMIN 3.5 02/05/2012 1913   AST 16 02/05/2012 1913   ALT 11 02/05/2012 1913   ALKPHOS 77 02/05/2012 1913   BILITOT 0.2* 02/05/2012 1913   GFRNONAA >90 02/14/2012 2345   GFRAA >90 02/14/2012 2345    Lab Results  Component Value Date   TSH 0.246* 02/15/2012     ASSESSMENT AND PLAN   Multiple sclerosis  Depression  Ataxic gait  Chronic fatigue  Urinary frequency  Dysesthesia  Hypersomnia with sleep apnea   1.  She is currently on 60 mg Prozac daily and does not want to consider a different medication at this time. I will refer her to psychology for evaluation and counseling as her depression is worsening and she also has life stressors. 2.  Continue Tecfidera. I will check a CBC with differential to make sure that she does not have severe  lymphopenia 3.   I'll renew her fentanyl and Lyrica. She will continue on phentermine for fatigue and weight loss.  She will return to see me in 4 months or sooner if she has new or worsening neurologic symptoms   Richard A. Epimenio Foot, MD, PhD 08/10/2014, 2:40 PM Certified in Neurology, Clinical Neurophysiology, Sleep Medicine, Pain Medicine and Neuroimaging  [facility] [address] [place], [place] [address] [phone]

## 2014-08-11 ENCOUNTER — Telehealth: Payer: Self-pay | Admitting: *Deleted

## 2014-08-11 DIAGNOSIS — R21 Rash and other nonspecific skin eruption: Secondary | ICD-10-CM

## 2014-08-11 DIAGNOSIS — M79671 Pain in right foot: Secondary | ICD-10-CM

## 2014-08-11 DIAGNOSIS — M79672 Pain in left foot: Principal | ICD-10-CM

## 2014-08-11 LAB — CBC WITH DIFFERENTIAL/PLATELET
BASOS: 0 %
Basophils Absolute: 0 10*3/uL (ref 0.0–0.2)
EOS (ABSOLUTE): 0 10*3/uL (ref 0.0–0.4)
EOS: 1 %
HEMOGLOBIN: 11.9 g/dL (ref 11.1–15.9)
Hematocrit: 34 % (ref 34.0–46.6)
IMMATURE GRANS (ABS): 0 10*3/uL (ref 0.0–0.1)
IMMATURE GRANULOCYTES: 0 %
Lymphocytes Absolute: 1.4 10*3/uL (ref 0.7–3.1)
Lymphs: 35 %
MCH: 29 pg (ref 26.6–33.0)
MCHC: 35 g/dL (ref 31.5–35.7)
MCV: 83 fL (ref 79–97)
MONOCYTES: 8 %
Monocytes Absolute: 0.3 10*3/uL (ref 0.1–0.9)
NEUTROS PCT: 56 %
Neutrophils Absolute: 2.3 10*3/uL (ref 1.4–7.0)
PLATELETS: 247 10*3/uL (ref 150–379)
RBC: 4.11 x10E6/uL (ref 3.77–5.28)
RDW: 14.5 % (ref 12.3–15.4)
WBC: 4.2 10*3/uL (ref 3.4–10.8)

## 2014-08-11 NOTE — Telephone Encounter (Signed)
I have spoken with Alycia this afternoon and per RAS, advised that labwork done in our office yesterday was ok.  She verbalized understanding of same, sts. she would like referral to podiatry to eval rash, pain to both feet.  Referral ordered/fim

## 2014-08-11 NOTE — Telephone Encounter (Signed)
-----   Message from Asa Lente, MD sent at 08/11/2014 12:32 PM EDT ----- Please let her know labs are fine.

## 2014-08-16 ENCOUNTER — Other Ambulatory Visit: Payer: Self-pay | Admitting: Neurology

## 2014-08-16 ENCOUNTER — Encounter: Payer: Self-pay | Admitting: *Deleted

## 2014-08-16 MED ORDER — LYRICA 150 MG PO CAPS: ORAL_CAPSULE | ORAL | Status: DC

## 2014-08-16 NOTE — Progress Notes (Signed)
Lyrica rx. up front [facility]/fim 

## 2014-08-16 NOTE — Telephone Encounter (Signed)
Patient called and stated that her medication Rx. LYRICA 150 MG capsule is wrong, she says that the quantity is incorrect. Please call and advise.

## 2014-08-16 NOTE — Telephone Encounter (Signed)
The patient takes 3 Lyrica per days, however the previous Rx was only written for #60.  Patient is requesting new Rx for #90, so each fill will last an entire month.  Rx has been re-entered for #90, and forwarded to provider for approval.

## 2014-08-21 ENCOUNTER — Other Ambulatory Visit: Payer: Self-pay | Admitting: Neurology

## 2014-09-05 ENCOUNTER — Telehealth: Payer: Self-pay | Admitting: Neurology

## 2014-09-12 ENCOUNTER — Ambulatory Visit: Payer: Medicare HMO | Admitting: Podiatry

## 2014-09-14 ENCOUNTER — Other Ambulatory Visit: Payer: Self-pay | Admitting: Neurology

## 2014-09-14 MED ORDER — FENTANYL 25 MCG/HR TD PT72: 25.0000 ug | MEDICATED_PATCH | TRANSDERMAL | Status: DC

## 2014-09-14 NOTE — Telephone Encounter (Signed)
Patient calling for a refill of Fentanyl 25 mcg/ Best call back is [phone]

## 2014-09-14 NOTE — Telephone Encounter (Signed)
Request entered, forwarded to provider for review.  

## 2014-09-15 ENCOUNTER — Encounter: Payer: Self-pay | Admitting: *Deleted

## 2014-09-15 NOTE — Progress Notes (Signed)
Rx. for Fentanyl up front [facility]/fim

## 2014-09-19 NOTE — Telephone Encounter (Signed)
error 

## 2014-10-12 ENCOUNTER — Other Ambulatory Visit: Payer: Self-pay | Admitting: Neurology

## 2014-10-12 MED ORDER — FENTANYL 25 MCG/HR TD PT72: 25.0000 ug | MEDICATED_PATCH | TRANSDERMAL | Status: DC

## 2014-10-12 NOTE — Telephone Encounter (Signed)
Request entered, forwarded to provider for approval.  

## 2014-10-12 NOTE — Telephone Encounter (Signed)
Patient called to request refill on fentaNYL (DURAGESIC - DOSED MCG/HR) 25 MCG/HR patch

## 2014-10-13 ENCOUNTER — Encounter: Payer: Self-pay | Admitting: *Deleted

## 2014-10-13 NOTE — Progress Notes (Signed)
Fentanyl rx. up front [facility]/fim 

## 2014-11-16 ENCOUNTER — Telehealth: Payer: Self-pay | Admitting: Neurology

## 2014-11-16 ENCOUNTER — Other Ambulatory Visit: Payer: Self-pay

## 2014-11-16 MED ORDER — FENTANYL 25 MCG/HR TD PT72: 25.0000 ug | MEDICATED_PATCH | TRANSDERMAL | Status: DC

## 2014-11-16 NOTE — Addendum Note (Signed)
Addended by: Candis Schatz I on: 11/16/2014 03:59 PM   Modules accepted: Orders

## 2014-11-16 NOTE — Telephone Encounter (Signed)
It appears [facility] has declined this request.  I called the patient back to advise.  She requested a message be sent to [facility] regarding this.

## 2014-11-16 NOTE — Telephone Encounter (Signed)
Dr Sater is out of the office, request forwarded to [facility] for review.  

## 2014-11-16 NOTE — Addendum Note (Signed)
Addended by: Candis Schatz I on: 11/16/2014 03:55 PM   Modules accepted: Orders

## 2014-11-16 NOTE — Telephone Encounter (Signed)
Pt called inquiring about medication refill. Please call and advise pt on status of refill at [phone]

## 2014-11-16 NOTE — Telephone Encounter (Signed)
Dr Sater is out of the office.  Request entered, forwarded to [facility] for review.  

## 2014-11-16 NOTE — Telephone Encounter (Signed)
Pt called requesting refill for fentaNYL (DURAGESIC - DOSED MCG/HR) 25 MCG/HR patch . Pt advised RX will be ready within 24 hours unless informed otherwise by RN. Thank you!

## 2014-11-16 NOTE — Telephone Encounter (Signed)
I have spoken with Joy Cobb this afternoon.  She is an MS pt., using Fentanyl patches for chronic pain.  Last rx. was printed, 10-12-14, filled on 10-17-14.  She is due to change her patch this afternoon.  RAS is out of the office.  Per Dr. Daisy Blossom, ok to print and she will sign  Rx. printed, signed, up front [facility].  Pt. aware/fim

## 2014-11-17 ENCOUNTER — Other Ambulatory Visit: Payer: Self-pay

## 2014-11-17 MED ORDER — FLUOXETINE HCL 20 MG PO CAPS: 20.0000 mg | ORAL_CAPSULE | Freq: Three times a day (TID) | ORAL | Status: DC

## 2014-12-14 ENCOUNTER — Other Ambulatory Visit: Payer: Self-pay | Admitting: Neurology

## 2014-12-14 ENCOUNTER — Ambulatory Visit: Payer: Medicare HMO | Admitting: Neurology

## 2014-12-14 MED ORDER — FENTANYL 25 MCG/HR TD PT72: 25.0000 ug | MEDICATED_PATCH | TRANSDERMAL | Status: DC

## 2014-12-14 NOTE — Telephone Encounter (Signed)
Pt called requesting refill for fentaNYL (DURAGESIC - DOSED MCG/HR) 25 MCG/HR patch . Pt advised RX will be ready within 24 hrs unless otherwise informed by RN

## 2014-12-15 ENCOUNTER — Encounter: Payer: Self-pay | Admitting: *Deleted

## 2014-12-15 NOTE — Progress Notes (Signed)
Fentanyl rx. up front [facility]/fim 

## 2014-12-20 ENCOUNTER — Telehealth: Payer: Self-pay | Admitting: Neurology

## 2014-12-20 ENCOUNTER — Encounter: Payer: Self-pay | Admitting: *Deleted

## 2014-12-20 NOTE — Telephone Encounter (Signed)
I have spoken with Joy Cobb this afternoon and advised that I will call [employer] tomorrow and confirm specialty pharmacy.  She sts. she is not actually out of Tecfidera, just hasn't had it delivered in 2 mos.  I will call her back after speaking with [employer]/fim

## 2014-12-20 NOTE — Telephone Encounter (Signed)
Pt called requesting refill for Dimethyl Fumarate (TECFIDERA) 240 MG CPDR , she hasn't used medication in as [employer] stopeed using Psychologist, occupational. Pt sts she called [facility] and pharmacist said if RX was faxed he could get medication. The fax# [phone]

## 2014-12-20 NOTE — Progress Notes (Signed)
Hydrocodone rx. up front [facility]/fim 

## 2014-12-21 NOTE — Telephone Encounter (Signed)
I have spoken with Joy Cobb this morning and let her know that I have been in contact with [employer]--they will do a new benefits investigation, even tho there has been no change in insurance.  When I was confirming her information, we discovered that the physician who sent her Tecfidera start form in was not Dr. Nydia Bouton was a Dr. Fredric Mare.  Since Dr. Epimenio Foot is now the rx'ing physician, a new start form will need to be submitted before benefits investigation can be done.  [name] sts. she can come by the office tomorrow to sign the start form.  She sts. she has about 2 weeks of Tecfidera left.  She will contact me if she has not received a new shipment of Tecfidera and is going to run out./fim

## 2014-12-22 NOTE — Telephone Encounter (Signed)
Ben presented to the office today and signed Tecfidera start form, which I have faxed to [employer]/fim

## 2014-12-26 ENCOUNTER — Encounter: Payer: Self-pay | Admitting: *Deleted

## 2014-12-26 ENCOUNTER — Telehealth: Payer: Self-pay | Admitting: *Deleted

## 2014-12-26 NOTE — Telephone Encounter (Signed)
LMOM that [employer] has completed benefits investigation for Tecfidera, and [facility] should be contacting her to schedule delivery of Tecfidera; she should call me if she doesn't hear from them in the next couple of business days/fim

## 2015-01-12 ENCOUNTER — Telehealth: Payer: Self-pay | Admitting: Neurology

## 2015-01-12 MED ORDER — FENTANYL 25 MCG/HR TD PT72: 25.0000 ug | MEDICATED_PATCH | TRANSDERMAL | Status: DC

## 2015-01-12 NOTE — Telephone Encounter (Signed)
Rx. printed, signed, up front [facility]/fim 

## 2015-01-12 NOTE — Telephone Encounter (Signed)
Patient is calling to get a written Rx for fentaNYL (DURAGESIC - DOSED MCG/HR) 25 MCG/HR patch. I advised the Rx will be ready in 24 hours unless otherwise advised by the nurse. Thank you.

## 2015-01-17 ENCOUNTER — Ambulatory Visit: Payer: Medicare HMO | Admitting: Neurology

## 2015-01-24 ENCOUNTER — Telehealth: Payer: Self-pay | Admitting: *Deleted

## 2015-01-24 ENCOUNTER — Other Ambulatory Visit: Payer: Self-pay | Admitting: Neurology

## 2015-01-24 MED ORDER — LYRICA 150 MG PO CAPS: ORAL_CAPSULE | ORAL | Status: DC

## 2015-01-24 NOTE — Telephone Encounter (Signed)
Patient called to request refill of LYRICA 150 MG capsule

## 2015-01-24 NOTE — Telephone Encounter (Signed)
Request entered, forwarded to provider for approval.  

## 2015-01-24 NOTE — Telephone Encounter (Signed)
Rx for Lyrica faxed and confirmed to [facility] at [phone].

## 2015-02-14 ENCOUNTER — Telehealth: Payer: Self-pay | Admitting: *Deleted

## 2015-02-14 MED ORDER — FENTANYL 25 MCG/HR TD PT72: 25.0000 ug | MEDICATED_PATCH | TRANSDERMAL | Status: DC

## 2015-02-14 NOTE — Telephone Encounter (Signed)
Patient was last seen in July.  She had to cancel last 2 appts.  States she will call back to reschedule, but would like to get a refill on Fentanyl.  Would you like to proceed with order at this time?  Thank you.

## 2015-02-14 NOTE — Telephone Encounter (Signed)
I have spoken with Joy Cobb this morning and advised that RAS can r/f Fentanyl this month, but due to close monitoring by the DEA, of physicians who rx. controlled substances, he will have to see her in the office before he can provide r/f after this month.  She verbalized understanding of same.  Appt. given for 03-14-15, and she is aware that she must keep this appt. or Feb. refill of Fentanyl can not be provided/fim

## 2015-02-14 NOTE — Telephone Encounter (Signed)
The DEA requires that we closely follow patients who are on controlled substances.   She needs to follow-up with me or another doctor within one month.  We can write her fentanyl prescription for this month but I cannot write anymore if she doesn't follow up

## 2015-02-14 NOTE — Telephone Encounter (Signed)
Fentanyl rx. printed, signed, up front [facility]/fim

## 2015-02-15 ENCOUNTER — Telehealth: Payer: Self-pay | Admitting: Neurology

## 2015-02-15 NOTE — Telephone Encounter (Signed)
I have spoken with Joy Cobb t his afternoon--she is coming in to the office to pick up a rx. tomorrow and I have asked her to bring her new ins. card with her so we can make a copy.  Then I will send new ins. info to [employer] so they can do a new benefits investigation.  She verbalized understanding of same/fim

## 2015-02-15 NOTE — Telephone Encounter (Signed)
Pt called will need tecfidera in a few weeks. New insurance is with [employer]  Member ID [id number]  RX BIN [id number]  GRP - [id number]  RX PCN# [id number].

## 2015-02-16 ENCOUNTER — Ambulatory Visit: Payer: Medicare HMO | Admitting: Neurology

## 2015-02-23 NOTE — Telephone Encounter (Signed)
I will address this once Alexzandra brings ins. info in/fim

## 2015-02-27 NOTE — Telephone Encounter (Signed)
Pt called said RX got sent to [facility]. The [facility] pharmacy called her over the weekend. She told them that was an error. Please call and advise at [phone]

## 2015-02-27 NOTE — Telephone Encounter (Signed)
I have spoken with Wolak is going to call [employer] at [phone] and let them know of her ins. change so they can do a new benefits investigation/fim

## 2015-03-02 ENCOUNTER — Encounter: Payer: Self-pay | Admitting: *Deleted

## 2015-03-03 ENCOUNTER — Encounter: Payer: Self-pay | Admitting: *Deleted

## 2015-03-14 ENCOUNTER — Encounter: Payer: Self-pay | Admitting: Neurology

## 2015-03-14 ENCOUNTER — Ambulatory Visit (INDEPENDENT_AMBULATORY_CARE_PROVIDER_SITE_OTHER): Payer: Medicare Other | Admitting: Neurology

## 2015-03-14 VITALS — BP 114/76 | HR 70 | Resp 16 | Ht 65.0 in | Wt 208.2 lb

## 2015-03-14 DIAGNOSIS — R350 Frequency of micturition: Secondary | ICD-10-CM

## 2015-03-14 DIAGNOSIS — G35 Multiple sclerosis: Secondary | ICD-10-CM

## 2015-03-14 DIAGNOSIS — R260 Ataxic gait: Secondary | ICD-10-CM

## 2015-03-14 DIAGNOSIS — R208 Other disturbances of skin sensation: Secondary | ICD-10-CM | POA: Diagnosis not present

## 2015-03-14 DIAGNOSIS — G4710 Hypersomnia, unspecified: Secondary | ICD-10-CM

## 2015-03-14 DIAGNOSIS — R5382 Chronic fatigue, unspecified: Secondary | ICD-10-CM | POA: Diagnosis not present

## 2015-03-14 MED ORDER — FENTANYL 25 MCG/HR TD PT72: 25.0000 ug | MEDICATED_PATCH | TRANSDERMAL | Status: DC

## 2015-03-14 MED ORDER — LYRICA 150 MG PO CAPS: ORAL_CAPSULE | ORAL | Status: DC

## 2015-03-14 MED ORDER — BACLOFEN 10 MG PO TABS: 10.0000 mg | ORAL_TABLET | Freq: Three times a day (TID) | ORAL | Status: DC

## 2015-03-14 MED ORDER — DIAZEPAM 5 MG PO TABS: 5.0000 mg | ORAL_TABLET | Freq: Every day | ORAL | Status: DC

## 2015-03-14 MED ORDER — FLUOXETINE HCL 20 MG PO CAPS: 40.0000 mg | ORAL_CAPSULE | Freq: Every day | ORAL | Status: DC

## 2015-03-14 NOTE — Progress Notes (Signed)
[facility]  PATIENT: Joy Cobb DOB: 16-Sep-1980  REFERRING CLINICIAN: Greggory Stallion Osei-Bonsu  HISTORY FROM: [name] REASON FOR VISIT: MS and related symptoms   HISTORICAL  CHIEF COMPLAINT:  Chief Complaint  Patient presents with  . Multiple Sclerosis    Sts. she continues to tolerate Tecfidera well.  Sts. she has not followed up with psych for depression, as discussed at last ov.--due to copay..  Sts. she continues to talk/yell/ hit and bite in her sleep and would like to discuss the cause for this/fim    HISTORY OF PRESENT ILLNESS:  Tateanna Bach is a 35 yo woman with MS diagnosed in 2012.     She has had bizarre sleep behavior recently.   She is acting out her dreams and her partner has noticed that she talks, sometimes screams and once was trying to bite her.    Her PSG last year, did not show any parasomnias.    She continues to have excessive daytime sleepiness.   She is on Fentanyl and Lyrica and we discussed these drugs may worsen sleepiness.  She has been doing this for at least 5 months.     Gait/strength/sensation:   She reports mild left greater than right leg weakness that is usually mild. She notes tingling sometimes with pain in the feet. Her gait is mildly off but when she is tired her gait is worse. She has mild spasticity in her legs that has been helped by the baclofen. She notes pain  mostly in the legs and back. Pain is achy.   She takes Lyrica and is also on fentanyl for the pain.  Vision:   She denies any current problems with her eyes and feels that she recovered well from the optic neuritis.    She notes light sensitivity at night (headlights especially)  Bladder:  She notes some bladder frequency and urgency. She had been prescribed location in the past but she does not take it on a regular basis because this problem is not as bad as it had been in the past.  Mood/cognitive:   She notes less depression this year associated with having  less stress    She has some crying spells.  She denies suicide ideation or plan.   She is taking Prozac 20 mg x 2 nightly.  Cymbalta has not helped in past.   She notes impaired short-term memory, attention, and word finding.   She loses items a lot. She'll sometimes read a paragraph and not remember what she had just read.     Fatigue/sleep:  She has difficulty with fatigue and excessive sleepiness. On phentermine, she lost 29 pounds.   She feels it helped her fatigue at first but not more recently.   She snores but PSG did not show any significant OSA or other sleep disturbance.     MS History:  She was diagnosed with multiple sclerosis in April 2012 after her second episode of optic neuritis. Her previous episode was about 7 or 8 months earlier and an MRI of the brain at that time did not show changes consistent with MS. After her second episode she had another MRI which showed some changes in the brain consistent with MS. She received IV steroids for the second optic neuritis.  First episode was left and second episode was right.   A lumbar puncture showed the CSF was consistent with Multiple sclerosis.   She was initially placed on Betaseron. However, she continued to have exacerbations. Additionally,  she had difficulty tolerating it well. In 2012, she had 3 other exacerbations while on Betaseron. At least some of them involved her spine and she had evidence on MRI and bilateral changes in her legs on exam. Due to the aggressiveness of the MS, she received 8 courses of IV Cytoxan while she was living in [place].  She then was switched to Tecfidera in 2013 and remains on that medication.  She moved to [facility] in 2013 and started to see Dr. Leotis Shames. When he moved further away, she transferred her care to me. In 2014, she had some worsening gait that was felt to be due to an exacerbation that she receive a course of steroids. She has not had any further exacerbations while on Tecfidera.  Her last MRI  was early 2015 at [facility].    REVIEW OF SYSTEMS:  Constitutional: No fevers, chills, sweats, or change in appetite.   Lost 29 pounds Eyes: No visual changes, double vision, eye pain Ear, nose and throat: No hearing loss, ear pain, nasal congestion, sore throat Cardiovascular: No chest pain, palpitations Respiratory:  No shortness of breath at rest or with exertion.   Snores but no OSA GastrointestinaI: Sh notes constipation.  No nausea, vomiting, diarrhea, abdominal pain, fecal incontinence Genitourinary:  Some urgency and frequency.. Musculoskeletal:  No neck pain, back pain Integumentary: No rash, pruritus, skin lesions Neurological: as above Psychiatric: see above Endocrine: No palpitations, diaphoresis, change in appetite, or increased thirst Hematologic/Lymphatic:  No anemia, purpura, petechiae. Allergic/Immunologic: No itchy/runny eyes, nasal congestion, recent allergic reactions, rashes  ALLERGIES: Allergies  Allergen Reactions  . Amoxicillin   . Penicillins Hives  . Temazepam     Sharp pains in stomach  . Septra [Sulfamethoxazole-Trimethoprim] Rash    HOME MEDICATIONS: Outpatient Prescriptions Prior to Visit  Medication Sig Dispense Refill  . AFLURIA PRESERVATIVE FREE 0.5 ML SUSY   0  . baclofen (LIORESAL) 10 MG tablet TAKE 1 TABLET BY MOUTH 3 TIMES A DAY 90 tablet 6  . CARAFATE 1 GM/10ML suspension Reported on 03/14/2015  3  . DEXILANT 60 MG capsule     . Dimethyl Fumarate (TECFIDERA) 240 MG CPDR Take 240 mg by mouth 2 (two) times daily.    . drospirenone-ethinyl estradiol (YAZ,GIANVI,LORYNA) 3-0.02 MG tablet Take 1 tablet by mouth daily.    . fentaNYL (DURAGESIC - DOSED MCG/HR) 25 MCG/HR patch Place 1 patch (25 mcg total) onto the skin every 3 (three) days. 10 patch 0  . FLUoxetine (PROZAC) 20 MG capsule Take 1 capsule (20 mg total) by mouth 3 (three) times daily. 90 capsule 3  . Ibuprofen 200 MG CAPS     . LYRICA 150 MG capsule One in the morning and two  at bedtime 90 capsule 1  . metoCLOPramide (REGLAN) 5 MG tablet   11  . phentermine (ADIPEX-P) 37.5 MG tablet Take 1 tablet (37.5 mg total) by mouth daily before breakfast. 30 tablet 5  . lactulose (CHRONULAC) 10 GM/15ML solution Take 20 g by mouth 2 (two) times daily as needed. Reported on 03/14/2015    . etodolac (LODINE) 400 MG tablet Take 1 tablet (400 mg total) by mouth 2 (two) times daily. (Patient not taking: Reported on 03/14/2015) 60 tablet 2   No facility-administered medications prior to visit.    PAST MEDICAL HISTORY: Past Medical History  Diagnosis Date  . Multiple sclerosis (HCC)   . IBS (irritable bowel syndrome)   . Colonic inertia   . Renal disorder   .  Bladder spasms   . Vision abnormalities   . Neuropathy (HCC)     PAST SURGICAL HISTORY: Past Surgical History  Procedure Laterality Date  . Breast surgery    . Cystectomy    . Polyp removal      FAMILY HISTORY: Family History  Problem Relation Age of Onset  . Neuropathy Mother   . Leukemia Father     SOCIAL HISTORY:  Social History   Social History  . Marital Status: Single    Spouse Name: N/A  . Number of Children: N/A  . Years of Education: N/A   Occupational History  . Not on file.   Social History Main Topics  . Smoking status: Never Smoker   . Smokeless tobacco: Never Used  . Alcohol Use: No  . Drug Use: No  . Sexual Activity: Not on file   Other Topics Concern  . Not on file   Social History Narrative     PHYSICAL EXAM  Filed Vitals:   03/14/15 1612  BP: 114/76  Pulse: 70  Resp: 16  Height: 5\' 5"  (1.651 m)  Weight: 208 lb 3.2 oz (94.439 kg)    Body mass index is 34.65 kg/(m^2).   General: The patient is well-developed and well-nourished and in no acute distress  Neurologic Exam  Mental status: The patient is alert and oriented x 3 at the time of the examination. The patient has apparent normal recent and remote memory, with an apparently normal attention span and  concentration ability.   Speech is normal.  Cranial nerves: Extraocular movements are full. Pupils show mild right APD.   No longer notes color desaturation.   Facial symmetry is present. There is reduced right facial sensation.  Facial strength is normal.  Trapezius and sternocleidomastoid strength is normal. No dysarthria is noted.  The tongue is midline, and the patient has symmetric elevation of the soft palate. No obvious hearing deficits are noted.  Motor:  Muscle bulk is normal. Tone is slightly increased in both legs.. Strength is  5 / 5 in all 4 extremities.   Sensory: Sensory testing is intact to touch, vibration sensation, in all 4 extremities.  Coordination: Cerebellar testing reveals good finger-nose-finger and slightly reduced heel-to-shin bilaterally.  Gait and station: Station is normal. Gait is slightly wide. Her tandem gait is mildly wide.. Romberg is negative.   Reflexes: Deep tendon reflexes are symmetric and increased with spread at both knees.    DIAGNOSTIC DATA (LABS, IMAGING, TESTING) - I reviewed patient records, labs, notes, testing and imaging myself where available.  Lab Results  Component Value Date   WBC 4.2 08/10/2014   HGB 13.1 02/14/2012   HCT 34.0 08/10/2014   MCV 83 08/10/2014   PLT 247 08/10/2014      Component Value Date/Time   NA 136 02/14/2012 2345   K 3.7 02/14/2012 2345   CL 99 02/14/2012 2345   CO2 29 02/14/2012 2345   GLUCOSE 98 02/14/2012 2345   BUN 13 02/14/2012 2345   CREATININE 0.76 02/14/2012 2345   CALCIUM 9.2 02/14/2012 2345   PROT 7.5 02/05/2012 1913   ALBUMIN 3.5 02/05/2012 1913   AST 16 02/05/2012 1913   ALT 11 02/05/2012 1913   ALKPHOS 77 02/05/2012 1913   BILITOT 0.2* 02/05/2012 1913   GFRNONAA >90 02/14/2012 2345   GFRAA >90 02/14/2012 2345    Lab Results  Component Value Date   TSH 0.246* 02/15/2012     ASSESSMENT AND PLAN   Multiple sclerosis (  HCC)  Ataxic gait  Chronic  fatigue  Dysesthesia  Hypersomnia  Urinary frequency   1.   Continue Tecfidera. I will check a CBC with differential to make sure that she does not have severe lymphopenia and LFT.   We will check an MRI of the brain with and without contrast to make sure that there has not been subclinical progression. If present, we will need to consider a different medication. 2.   Valium 5 mg nightly for RBD. 3.   I'll renew her fentanyl and Lyrica.   She will return to see me in 5-6 months or sooner if she has new or worsening neurologic symptoms   Blimy Napoleon A. Epimenio Foot, MD, PhD 03/14/2015, 4:20 PM Certified in Neurology, Clinical Neurophysiology, Sleep Medicine, Pain Medicine and Neuroimaging  [facility] [address] [place], [place] [address] [phone]

## 2015-03-15 LAB — CBC WITH DIFFERENTIAL/PLATELET
BASOS ABS: 0 10*3/uL (ref 0.0–0.2)
BASOS: 0 %
EOS (ABSOLUTE): 0.1 10*3/uL (ref 0.0–0.4)
Eos: 1 %
HEMOGLOBIN: 11.2 g/dL (ref 11.1–15.9)
Hematocrit: 32.9 % — ABNORMAL LOW (ref 34.0–46.6)
Immature Grans (Abs): 0 10*3/uL (ref 0.0–0.1)
Immature Granulocytes: 0 %
LYMPHS ABS: 1.5 10*3/uL (ref 0.7–3.1)
Lymphs: 33 %
MCH: 28.3 pg (ref 26.6–33.0)
MCHC: 34 g/dL (ref 31.5–35.7)
MCV: 83 fL (ref 79–97)
Monocytes Absolute: 0.3 10*3/uL (ref 0.1–0.9)
Monocytes: 7 %
NEUTROS ABS: 2.6 10*3/uL (ref 1.4–7.0)
Neutrophils: 59 %
PLATELETS: 206 10*3/uL (ref 150–379)
RBC: 3.96 x10E6/uL (ref 3.77–5.28)
RDW: 14.7 % (ref 12.3–15.4)
WBC: 4.5 10*3/uL (ref 3.4–10.8)

## 2015-03-15 LAB — HEPATIC FUNCTION PANEL
ALK PHOS: 74 IU/L (ref 39–117)
ALT: 11 IU/L (ref 0–32)
AST: 13 IU/L (ref 0–40)
Albumin: 4.2 g/dL (ref 3.5–5.5)
Bilirubin Total: <0.2 mg/dL (ref 0.0–1.2)
Bilirubin, Direct: 0.08 mg/dL (ref 0.00–0.40)
TOTAL PROTEIN: 7.1 g/dL (ref 6.0–8.5)

## 2015-03-23 ENCOUNTER — Ambulatory Visit
Admission: RE | Admit: 2015-03-23 | Discharge: 2015-03-23 | Disposition: A | Discharge status: Home / Self Care | Payer: Medicare Other | Source: Ambulatory Visit | Attending: Neurology | Admitting: Neurology

## 2015-03-23 DIAGNOSIS — G35 Multiple sclerosis: Secondary | ICD-10-CM

## 2015-03-23 DIAGNOSIS — R260 Ataxic gait: Secondary | ICD-10-CM | POA: Diagnosis not present

## 2015-03-23 MED ORDER — GADOBENATE DIMEGLUMINE 529 MG/ML IV SOLN
19.0000 mL | Freq: Once | INTRAVENOUS | Status: AC | PRN
  Administered 2015-03-23: 19 mL via INTRAVENOUS

## 2015-03-24 ENCOUNTER — Telehealth: Payer: Self-pay | Admitting: *Deleted

## 2015-03-24 NOTE — Telephone Encounter (Signed)
-----   Message from Asa Lente, MD sent at 03/23/2015  5:46 PM EST ----- Please let her know that the MRI of the cervical spine did not show any MS lesions and is unchanged from the 2013 MRI.  The MRI of the brain just shows some scathered MS plaques but nothing new.    Continue Tecfidera.

## 2015-03-24 NOTE — Telephone Encounter (Signed)
PC with Edwardine and per RAS, I have advised that the mri of her neck did not show any MS sesions and is unchanged from the 2013 mri.  MRI brain just shows scattered MS plaques but nothing new; she should continue Tecfidera.  She verbalized understanding of same/fim

## 2015-04-06 ENCOUNTER — Other Ambulatory Visit: Payer: Self-pay | Admitting: Neurology

## 2015-04-09 ENCOUNTER — Other Ambulatory Visit: Payer: Self-pay | Admitting: Neurology

## 2015-04-13 ENCOUNTER — Telehealth: Payer: Self-pay | Admitting: Neurology

## 2015-04-13 NOTE — Telephone Encounter (Signed)
Patient is calling. She has been taking diazepam (VALIUM) 5 MG tablet but thinks this may not be strong enough because at first it was working and now she cannot tell any difference. Please call to discuss.

## 2015-04-14 MED ORDER — DIAZEPAM 10 MG PO TABS: 10.0000 mg | ORAL_TABLET | Freq: Four times a day (QID) | ORAL | Status: DC | PRN

## 2015-04-14 NOTE — Telephone Encounter (Signed)
I have spoken with Joy Cobb again today---per RAS, will increase Valium to  qhs.  She is agreeable with this plan. Rx. [facility] to [facility] [address]. per pt's request.  I have also spoken with [facility] at [facility] and cancelled the r/f on the Valium /fim

## 2015-04-14 NOTE — Telephone Encounter (Signed)
PC with Joy Cobb.  She sts. she is now screaming, hitting, biting in her sleep.  She is waking up less--about once per night.  Wonders if Valium  qhs needs to be adjusted.  Will check with RAS and call her back/fim

## 2015-04-18 ENCOUNTER — Telehealth: Payer: Self-pay | Admitting: Neurology

## 2015-04-18 MED ORDER — FENTANYL 25 MCG/HR TD PT72: 25.0000 ug | MEDICATED_PATCH | TRANSDERMAL | Status: DC

## 2015-04-18 NOTE — Telephone Encounter (Signed)
RAS sig/fim 

## 2015-04-18 NOTE — Telephone Encounter (Signed)
Pt is requesting rx refill on fentaNYL (DURAGESIC - DOSED MCG/HR) 25 MCG/HR patch

## 2015-04-18 NOTE — Telephone Encounter (Signed)
Fentanyl rx. up front [facility]/fim 

## 2015-05-10 DIAGNOSIS — G894 Chronic pain syndrome: Secondary | ICD-10-CM | POA: Diagnosis not present

## 2015-05-10 DIAGNOSIS — G35 Multiple sclerosis: Secondary | ICD-10-CM | POA: Diagnosis not present

## 2015-05-10 DIAGNOSIS — G629 Polyneuropathy, unspecified: Secondary | ICD-10-CM | POA: Diagnosis not present

## 2015-05-10 DIAGNOSIS — E669 Obesity, unspecified: Secondary | ICD-10-CM | POA: Diagnosis not present

## 2015-05-10 DIAGNOSIS — R739 Hyperglycemia, unspecified: Secondary | ICD-10-CM | POA: Diagnosis not present

## 2015-05-10 DIAGNOSIS — F329 Major depressive disorder, single episode, unspecified: Secondary | ICD-10-CM | POA: Diagnosis not present

## 2015-05-12 ENCOUNTER — Other Ambulatory Visit: Payer: Self-pay | Admitting: Physician Assistant

## 2015-05-12 DIAGNOSIS — Z1231 Encounter for screening mammogram for malignant neoplasm of breast: Secondary | ICD-10-CM

## 2015-05-17 ENCOUNTER — Telehealth: Payer: Self-pay | Admitting: Neurology

## 2015-05-17 MED ORDER — FENTANYL 25 MCG/HR TD PT72: 25.0000 ug | MEDICATED_PATCH | TRANSDERMAL | Status: DC

## 2015-05-17 NOTE — Telephone Encounter (Signed)
Fentanyl rx. up front [facility]/fim 

## 2015-05-17 NOTE — Telephone Encounter (Signed)
Patient called to request refill of fentaNYL (DURAGESIC - DOSED MCG/HR) 25 MCG/HR patch °

## 2015-05-17 NOTE — Telephone Encounter (Signed)
Rx. awaiting RAS sig/fim 

## 2015-05-26 DIAGNOSIS — Z0100 Encounter for examination of eyes and vision without abnormal findings: Secondary | ICD-10-CM | POA: Diagnosis not present

## 2015-05-26 DIAGNOSIS — G35 Multiple sclerosis: Secondary | ICD-10-CM | POA: Diagnosis not present

## 2015-05-26 DIAGNOSIS — E669 Obesity, unspecified: Secondary | ICD-10-CM | POA: Diagnosis not present

## 2015-05-26 DIAGNOSIS — Z0000 Encounter for general adult medical examination without abnormal findings: Secondary | ICD-10-CM | POA: Diagnosis not present

## 2015-05-26 DIAGNOSIS — R739 Hyperglycemia, unspecified: Secondary | ICD-10-CM | POA: Diagnosis not present

## 2015-05-26 DIAGNOSIS — Z113 Encounter for screening for infections with a predominantly sexual mode of transmission: Secondary | ICD-10-CM | POA: Diagnosis not present

## 2015-05-26 DIAGNOSIS — F329 Major depressive disorder, single episode, unspecified: Secondary | ICD-10-CM | POA: Diagnosis not present

## 2015-05-26 DIAGNOSIS — Z01118 Encounter for examination of ears and hearing with other abnormal findings: Secondary | ICD-10-CM | POA: Diagnosis not present

## 2015-05-26 DIAGNOSIS — G629 Polyneuropathy, unspecified: Secondary | ICD-10-CM | POA: Diagnosis not present

## 2015-05-29 ENCOUNTER — Ambulatory Visit
Admission: RE | Admit: 2015-05-29 | Discharge: 2015-05-29 | Disposition: A | Discharge status: Home / Self Care | Payer: Medicare Other | Source: Ambulatory Visit | Attending: Physician Assistant | Admitting: Physician Assistant

## 2015-05-29 DIAGNOSIS — Z1231 Encounter for screening mammogram for malignant neoplasm of breast: Secondary | ICD-10-CM

## 2015-06-12 ENCOUNTER — Other Ambulatory Visit: Payer: Self-pay | Admitting: Physician Assistant

## 2015-06-12 DIAGNOSIS — R928 Other abnormal and inconclusive findings on diagnostic imaging of breast: Secondary | ICD-10-CM

## 2015-06-15 ENCOUNTER — Telehealth: Payer: Self-pay | Admitting: Neurology

## 2015-06-15 MED ORDER — FENTANYL 25 MCG/HR TD PT72: 25.0000 ug | MEDICATED_PATCH | TRANSDERMAL | Status: DC

## 2015-06-15 NOTE — Telephone Encounter (Signed)
Rx. awaiting RAS sig/fim 

## 2015-06-15 NOTE — Telephone Encounter (Signed)
Fentanyl rx. up front [facility]/fim 

## 2015-06-15 NOTE — Telephone Encounter (Signed)
Patient requesting a written Rx for  fentaNYL (DURAGESIC - DOSED MCG/HR) 25 MCG/HR patch. I advised the Rx will be ready in 24 hours unless the nurse advises otherwise.  Also the patient needs a call to discuss some lab results she had gotten back from her PCP this week.

## 2015-06-19 ENCOUNTER — Telehealth: Payer: Self-pay | Admitting: Neurology

## 2015-06-19 MED ORDER — DIAZEPAM 10 MG PO TABS: 10.0000 mg | ORAL_TABLET | Freq: Four times a day (QID) | ORAL | Status: DC | PRN

## 2015-06-19 NOTE — Telephone Encounter (Signed)
Patient is calling to order diazepam 10 mg be sent to [facility] [address].  Thanks!

## 2015-06-19 NOTE — Telephone Encounter (Signed)
Rx. awaiting RAS sig/fim 

## 2015-06-19 NOTE — Telephone Encounter (Signed)
Diazepam rx. [facility]ed to [facility] [address]/fim

## 2015-06-21 ENCOUNTER — Ambulatory Visit
Admission: RE | Admit: 2015-06-21 | Discharge: 2015-06-21 | Disposition: A | Discharge status: Home / Self Care | Payer: Medicare Other | Source: Ambulatory Visit | Attending: Physician Assistant | Admitting: Physician Assistant

## 2015-06-21 DIAGNOSIS — R928 Other abnormal and inconclusive findings on diagnostic imaging of breast: Secondary | ICD-10-CM

## 2015-06-21 DIAGNOSIS — R922 Inconclusive mammogram: Secondary | ICD-10-CM | POA: Diagnosis not present

## 2015-06-21 DIAGNOSIS — N6489 Other specified disorders of breast: Secondary | ICD-10-CM | POA: Diagnosis not present

## 2015-06-23 DIAGNOSIS — G629 Polyneuropathy, unspecified: Secondary | ICD-10-CM | POA: Diagnosis not present

## 2015-06-23 DIAGNOSIS — F329 Major depressive disorder, single episode, unspecified: Secondary | ICD-10-CM | POA: Diagnosis not present

## 2015-06-23 DIAGNOSIS — G35 Multiple sclerosis: Secondary | ICD-10-CM | POA: Diagnosis not present

## 2015-06-23 DIAGNOSIS — G894 Chronic pain syndrome: Secondary | ICD-10-CM | POA: Diagnosis not present

## 2015-06-23 DIAGNOSIS — E669 Obesity, unspecified: Secondary | ICD-10-CM | POA: Diagnosis not present

## 2015-06-26 ENCOUNTER — Encounter: Payer: Self-pay | Admitting: Neurology

## 2015-06-26 DIAGNOSIS — E559 Vitamin D deficiency, unspecified: Secondary | ICD-10-CM | POA: Insufficient documentation

## 2015-07-17 ENCOUNTER — Other Ambulatory Visit: Payer: Self-pay | Admitting: Neurology

## 2015-07-17 MED ORDER — FENTANYL 25 MCG/HR TD PT72: 25.0000 ug | MEDICATED_PATCH | TRANSDERMAL | Status: DC

## 2015-07-17 NOTE — Telephone Encounter (Signed)
Patient requesting refill of fentaNYL (DURAGESIC - DOSED MCG/HR) 25 MCG/HR patch °Pharmacy: pick up ° °

## 2015-07-17 NOTE — Telephone Encounter (Signed)
Rx. awaiting Dr. Rose Phi sig. (RAS is ooo)/fim

## 2015-07-17 NOTE — Telephone Encounter (Signed)
Fentanyl rx. up front [facility]/fim 

## 2015-07-21 DIAGNOSIS — G894 Chronic pain syndrome: Secondary | ICD-10-CM | POA: Diagnosis not present

## 2015-07-21 DIAGNOSIS — G35 Multiple sclerosis: Secondary | ICD-10-CM | POA: Diagnosis not present

## 2015-07-21 DIAGNOSIS — F329 Major depressive disorder, single episode, unspecified: Secondary | ICD-10-CM | POA: Diagnosis not present

## 2015-07-21 DIAGNOSIS — E669 Obesity, unspecified: Secondary | ICD-10-CM | POA: Diagnosis not present

## 2015-07-21 DIAGNOSIS — G629 Polyneuropathy, unspecified: Secondary | ICD-10-CM | POA: Diagnosis not present

## 2015-08-11 DIAGNOSIS — K5900 Constipation, unspecified: Secondary | ICD-10-CM | POA: Diagnosis not present

## 2015-08-11 DIAGNOSIS — G629 Polyneuropathy, unspecified: Secondary | ICD-10-CM | POA: Diagnosis not present

## 2015-08-11 DIAGNOSIS — F329 Major depressive disorder, single episode, unspecified: Secondary | ICD-10-CM | POA: Diagnosis not present

## 2015-08-11 DIAGNOSIS — G35 Multiple sclerosis: Secondary | ICD-10-CM | POA: Diagnosis not present

## 2015-08-11 DIAGNOSIS — G894 Chronic pain syndrome: Secondary | ICD-10-CM | POA: Diagnosis not present

## 2015-08-16 ENCOUNTER — Telehealth: Payer: Self-pay | Admitting: Neurology

## 2015-08-16 MED ORDER — FENTANYL 25 MCG/HR TD PT72: 25.0000 ug | MEDICATED_PATCH | TRANSDERMAL | Status: DC

## 2015-08-16 NOTE — Telephone Encounter (Signed)
Rx. awaiting RAS sig/fim 

## 2015-08-16 NOTE — Telephone Encounter (Signed)
Patient requesting refill of fentaNYL (DURAGESIC - DOSED MCG/HR) 25 MCG/HR patch. ° ° °

## 2015-08-17 ENCOUNTER — Other Ambulatory Visit: Payer: Self-pay | Admitting: Internal Medicine

## 2015-08-17 DIAGNOSIS — G35 Multiple sclerosis: Secondary | ICD-10-CM | POA: Diagnosis not present

## 2015-08-17 DIAGNOSIS — Z0000 Encounter for general adult medical examination without abnormal findings: Secondary | ICD-10-CM | POA: Diagnosis not present

## 2015-08-17 DIAGNOSIS — M25551 Pain in right hip: Secondary | ICD-10-CM

## 2015-08-17 DIAGNOSIS — K5900 Constipation, unspecified: Secondary | ICD-10-CM | POA: Diagnosis not present

## 2015-08-17 NOTE — Telephone Encounter (Signed)
Fentanyl rx. up front [facility]/fim 

## 2015-08-21 ENCOUNTER — Telehealth: Payer: Self-pay | Admitting: Neurology

## 2015-08-21 MED ORDER — DIAZEPAM 10 MG PO TABS: 10.0000 mg | ORAL_TABLET | Freq: Four times a day (QID) | ORAL | Status: DC | PRN

## 2015-08-21 NOTE — Telephone Encounter (Signed)
Patient requesting refill of diazepam (VALIUM) 10 MG tablet . Pt has been out since 08/18/15. [facility]acy:[facility] [address]

## 2015-08-21 NOTE — Telephone Encounter (Signed)
Rx. awaiting RAS sig/fim 

## 2015-08-22 NOTE — Telephone Encounter (Signed)
Valium rx. has [facility]/fim

## 2015-09-11 ENCOUNTER — Encounter: Payer: Self-pay | Admitting: Neurology

## 2015-09-11 ENCOUNTER — Ambulatory Visit (INDEPENDENT_AMBULATORY_CARE_PROVIDER_SITE_OTHER): Payer: Medicare Other | Admitting: Neurology

## 2015-09-11 ENCOUNTER — Ambulatory Visit
Admission: RE | Admit: 2015-09-11 | Discharge: 2015-09-11 | Disposition: A | Discharge status: Home / Self Care | Payer: Medicare Other | Source: Ambulatory Visit | Attending: Internal Medicine | Admitting: Internal Medicine

## 2015-09-11 VITALS — BP 118/80 | HR 78 | Resp 16 | Ht 65.0 in | Wt 224.5 lb

## 2015-09-11 DIAGNOSIS — G35 Multiple sclerosis: Secondary | ICD-10-CM | POA: Diagnosis not present

## 2015-09-11 DIAGNOSIS — M25551 Pain in right hip: Secondary | ICD-10-CM

## 2015-09-11 DIAGNOSIS — G4752 REM sleep behavior disorder: Secondary | ICD-10-CM | POA: Diagnosis not present

## 2015-09-11 DIAGNOSIS — R208 Other disturbances of skin sensation: Secondary | ICD-10-CM | POA: Diagnosis not present

## 2015-09-11 DIAGNOSIS — R260 Ataxic gait: Secondary | ICD-10-CM

## 2015-09-11 DIAGNOSIS — R350 Frequency of micturition: Secondary | ICD-10-CM

## 2015-09-11 DIAGNOSIS — R5382 Chronic fatigue, unspecified: Secondary | ICD-10-CM

## 2015-09-11 MED ORDER — GADOBENATE DIMEGLUMINE 529 MG/ML IV SOLN
20.0000 mL | Freq: Once | INTRAVENOUS | Status: AC | PRN
  Administered 2015-09-11: 20 mL via INTRAVENOUS

## 2015-09-11 MED ORDER — DIAZEPAM 10 MG PO TABS: ORAL_TABLET | ORAL | 5 refills | Status: DC

## 2015-09-11 MED ORDER — FENTANYL 25 MCG/HR TD PT72: 25.0000 ug | MEDICATED_PATCH | TRANSDERMAL | 0 refills | Status: DC

## 2015-09-11 NOTE — Progress Notes (Signed)
[facility]  PATIENT: Joy Cobb DOB: 04-01-1980  REFERRING CLINICIAN: Greggory Stallion Osei-Bonsu  HISTORY FROM: [name] REASON FOR VISIT: MS and related symptoms   HISTORICAL  CHIEF COMPLAINT:  Chief Complaint  Patient presents with  . Multiple Sclerosis    Sts. she continues to tolerate Tecfidera well.  Sts. she is sleeping for about 5 hours at a time with Valium.  Sts. pcp increased her Prozac to 40mg  bid and she plans to start seeing a counselor soon.  Also sts. pcp started her on Vit. D 50,000iu twice per week--she would like to discuss this with RAS.  Sts. she stopped Dexilant--not sure she needs it./fim    HISTORY OF PRESENT ILLNESS:  Joy Cobb is a 35 yo woman with MS diagnosed in 2012.   She is on Tecfidera and tolerates it well.     She deneis any new exacerbations.      Sleep:    Valium ws started at the last visit for bizarre sleep behavior recently, likely REM behavior disorder. She now talks some in sleep but no yelling or kicking.    Her PSG last year, did not show any parasomnias.    She has excessive daytime sleepiness.   She is on Fentanyl and Lyrica and we discussed these drugs may worsen sleepiness.  She sleeps at least 5 hrs a night now which is better than before.     Gait/strength/sensation:   She reports mild left greater than right leg weakness that is usually mild. She notes tingling sometimes with pain in the feet. Her gait is mildly off but when she is tired her gait is worse. She has mild spasticity in her legs that has been helped by the baclofen. She notes pain  mostly in the legs and back. Pain is achy.   She takes Lyrica and is also on fentanyl for the pain.  Hip pain:   She has had more hip pain and she felt it was making gait worse..    She had an MRI and it shows avascular necrosis.   She had been on multiple courses of IV Solu-Medrol at the time of diagnosis (up in [place]).    Vision:   She denies any current problems with  her eyes and feels that she recovered well from the optic neuritis.    She notes light sensitivity at night   Bladder:  She notes some bladder frequency and urgency. She had been prescribed location in the past but she does not take it on a regular basis because this problem is not as bad as it had been in the past.  Mood/cognitive:   She notes less depression with higher dose of Prozac. She now rarely cries.   She denies suicide ideation or plan.      Cymbalta has not helped in past.   She notes impaired short-term memory, attention, and word finding.   She loses items a lot. She'll sometimes read a paragraph and not remember what she had just read.     Fatigue/sleep:  She has difficulty with fatigue and excessive sleepiness. Phentermine helped the fatigue is some and also help her lose weight.   She snores but PSG did not show any significant OSA or other sleep disturbance.     MS History:  She was diagnosed with multiple sclerosis in April 2012 after her second episode of optic neuritis. Her previous episode was about 7 or 8 months earlier and an MRI of the brain at  that time did not show changes consistent with MS. After her second episode she had another MRI which showed some changes in the brain consistent with MS. She received IV steroids for the second optic neuritis.  First episode was left and second episode was right.   A lumbar puncture showed the CSF was consistent with Multiple sclerosis.   She was initially placed on Betaseron. However, she continued to have exacerbations. Additionally, she had difficulty tolerating it well. In 2012, she had 3 other exacerbations while on Betaseron. At least some of them involved her spine and she had evidence on MRI and bilateral changes in her legs on exam. Due to the aggressiveness of the MS, she received 8 courses of IV Cytoxan while she was living in [place].  She then was switched to Tecfidera in 2013 and remains on that medication.  She moved to  [facility] in 2013 and started to see Dr. Leotis Shames. When he moved further away, she transferred her care to me. In 2014, she had some worsening gait that was felt to be due to an exacerbation that she receive a course of steroids. She has not had any further exacerbations while on Tecfidera.  Her last MRI was early 2015 at [facility].    REVIEW OF SYSTEMS:  Constitutional: No fevers, chills, sweats, or change in appetite.   Lost 29 pounds Eyes: No visual changes, double vision, eye pain Ear, nose and throat: No hearing loss, ear pain, nasal congestion, sore throat Cardiovascular: No chest pain, palpitations Respiratory:  No shortness of breath at rest or with exertion.   Snores but no OSA GastrointestinaI: Sh notes constipation.  No nausea, vomiting, diarrhea, abdominal pain, fecal incontinence Genitourinary:  Some urgency and frequency.. Musculoskeletal:  No neck pain, back pain Integumentary: No rash, pruritus, skin lesions Neurological: as above Psychiatric: see above Endocrine: No palpitations, diaphoresis, change in appetite, or increased thirst Hematologic/Lymphatic:  No anemia, purpura, petechiae. Allergic/Immunologic: No itchy/runny eyes, nasal congestion, recent allergic reactions, rashes  ALLERGIES: Allergies  Allergen Reactions  . Amoxicillin   . Penicillins Hives  . Temazepam     Sharp pains in stomach  . Septra [Sulfamethoxazole-Trimethoprim] Rash    HOME MEDICATIONS: Outpatient Medications Prior to Visit  Medication Sig Dispense Refill  . AFLURIA PRESERVATIVE FREE 0.5 ML SUSY   0  . baclofen (LIORESAL) 10 MG tablet Take 1 tablet (10 mg total) by mouth 3 (three) times daily. 90 tablet 11  . CARAFATE 1 GM/10ML suspension Reported on 03/14/2015  3  . Dimethyl Fumarate (TECFIDERA) 240 MG CPDR Take 240 mg by mouth 2 (two) times daily.    . drospirenone-ethinyl estradiol (YAZ,GIANVI,LORYNA) 3-0.02 MG tablet Take 1 tablet by mouth daily.    . Ibuprofen 200 MG CAPS      . LYRICA 150 MG capsule One in the morning and two at bedtime 90 capsule 5  . metoCLOPramide (REGLAN) 5 MG tablet   11  . phentermine (ADIPEX-P) 37.5 MG tablet TAKE 1 TABLET BY MOUTH EVERY DAY BEFORE BREAKFAST 30 tablet 5  . diazepam (VALIUM) 10 MG tablet Take 1 tablet (10 mg total) by mouth every 6 (six) hours as needed for anxiety. 30 tablet 1  . fentaNYL (DURAGESIC - DOSED MCG/HR) 25 MCG/HR patch Place 1 patch (25 mcg total) onto the skin every 3 (three) days. 10 patch 0  . DEXILANT 60 MG capsule     . FLUoxetine (PROZAC) 20 MG capsule Take 2 capsules (40 mg total) by mouth daily. 60  capsule 11  . lactulose (CHRONULAC) 10 GM/15ML solution Take 20 g by mouth 2 (two) times daily as needed. Reported on 03/14/2015     No facility-administered medications prior to visit.     PAST MEDICAL HISTORY: Past Medical History:  Diagnosis Date  . Bladder spasms   . Colonic inertia   . IBS (irritable bowel syndrome)   . Multiple sclerosis (HCC)   . Neuropathy (HCC)   . Renal disorder   . Vision abnormalities     PAST SURGICAL HISTORY: Past Surgical History:  Procedure Laterality Date  . BREAST SURGERY    . CYSTECTOMY    . polyp removal      FAMILY HISTORY: Family History  Problem Relation Age of Onset  . Neuropathy Mother   . Leukemia Father     SOCIAL HISTORY:  Social History   Social History  . Marital status: Single    Spouse name: N/A  . Number of children: N/A  . Years of education: N/A   Occupational History  . Not on file.   Social History Main Topics  . Smoking status: Never Smoker  . Smokeless tobacco: Never Used  . Alcohol use No  . Drug use: No  . Sexual activity: Not on file   Other Topics Concern  . Not on file   Social History Narrative  . No narrative on file     PHYSICAL EXAM  Vitals:   09/11/15 1353  BP: 118/80  Pulse: 78  Resp: 16  Weight: 224 lb 8 oz (101.8 kg)  Height: 5\' 5"  (1.651 m)    Body mass index is 37.36 kg/m.   General:  The patient is well-developed and well-nourished and in no acute distress  Neurologic Exam  Mental status: The patient is alert and oriented x 3 at the time of the examination. The patient has apparent normal recent and remote memory, with an apparently normal attention span and concentration ability.   Speech is normal.  Cranial nerves: Extraocular movements are full. Pupils show mild right APD.   No longer notes color desaturation.   Facial symmetry is present. There is reduced right facial sensation.  Facial strength is normal.  Trapezius and sternocleidomastoid strength is normal. No dysarthria is noted.  The tongue is midline, and the patient has symmetric elevation of the soft palate. No obvious hearing deficits are noted.  Motor:  Muscle bulk is normal. Tone is slightly increased in both legs.. Strength is  5 / 5 in all 4 extremities.   Sensory: Sensory testing is intact to touch, vibration sensation, in all 4 extremities.  Coordination: Cerebellar testing reveals good finger-nose-finger and slightly reduced heel-to-shin bilaterally.  Gait and station: Station is normal. Gait is slightly wide. Her tandem gait is mildly wide.. Romberg is negative.   Reflexes: Deep tendon reflexes are symmetric and increased with spread at both knees.    DIAGNOSTIC DATA (LABS, IMAGING, TESTING) - I reviewed patient records, labs, notes, testing and imaging myself where available.  Lab Results  Component Value Date   WBC 4.5 03/14/2015   HGB 13.1 02/14/2012   HCT 32.9 (L) 03/14/2015   MCV 83 03/14/2015   PLT 206 03/14/2015      Component Value Date/Time   NA 136 02/14/2012 2345   K 3.7 02/14/2012 2345   CL 99 02/14/2012 2345   CO2 29 02/14/2012 2345   GLUCOSE 98 02/14/2012 2345   BUN 13 02/14/2012 2345   CREATININE 0.76 02/14/2012 2345   CALCIUM  9.2 02/14/2012 2345   PROT 7.1 03/14/2015 1631   ALBUMIN 4.2 03/14/2015 1631   AST 13 03/14/2015 1631   ALT 11 03/14/2015 1631   ALKPHOS 74  03/14/2015 1631   BILITOT <0.2 03/14/2015 1631   GFRNONAA >90 02/14/2012 2345   GFRAA >90 02/14/2012 2345    Lab Results  Component Value Date   TSH 0.246 (L) 02/15/2012     ASSESSMENT AND PLAN   Multiple sclerosis (HCC)  Ataxic gait  Chronic fatigue  Dysesthesia  Urinary frequency  REM behavioral disorder   1.   Continue Tecfidera.  2.   Continue valium 10 mg nightly for RBD.   Continue fentanyl for pain. 3.   I'll renew her fentanyl and Lyrica.   She will return to see me in 4 months or sooner if she has new or worsening neurologic symptoms   Tonita Bills A. Epimenio Foot, MD, PhD 09/11/2015, 3:28 PM Certified in Neurology, Clinical Neurophysiology, Sleep Medicine, Pain Medicine and Neuroimaging  [facility] [address], [address] [place], [place] [address] [phone]

## 2015-09-20 DIAGNOSIS — G629 Polyneuropathy, unspecified: Secondary | ICD-10-CM | POA: Diagnosis not present

## 2015-09-20 DIAGNOSIS — K5900 Constipation, unspecified: Secondary | ICD-10-CM | POA: Diagnosis not present

## 2015-09-20 DIAGNOSIS — M87059 Idiopathic aseptic necrosis of unspecified femur: Secondary | ICD-10-CM | POA: Diagnosis not present

## 2015-09-20 DIAGNOSIS — G35 Multiple sclerosis: Secondary | ICD-10-CM | POA: Diagnosis not present

## 2015-10-01 ENCOUNTER — Other Ambulatory Visit: Payer: Self-pay | Admitting: Neurology

## 2015-10-16 ENCOUNTER — Telehealth: Payer: Self-pay | Admitting: Neurology

## 2015-10-16 MED ORDER — PHENTERMINE HCL 37.5 MG PO TABS: 37.5000 mg | ORAL_TABLET | Freq: Every day | ORAL | 5 refills | Status: DC

## 2015-10-16 MED ORDER — FENTANYL 25 MCG/HR TD PT72: 25.0000 ug | MEDICATED_PATCH | TRANSDERMAL | 0 refills | Status: DC

## 2015-10-16 NOTE — Telephone Encounter (Signed)
Patient requesting refill of fentaNYL (DURAGESIC - DOSED MCG/HR) 25 MCG/HR patch , phentermine (ADIPEX-P) 37.5 MG tablet Pharmacy: pick up and [id number] - , [place] - [address]

## 2015-10-16 NOTE — Telephone Encounter (Signed)
Rx's awaiting RAS sig/fim 

## 2015-10-17 ENCOUNTER — Other Ambulatory Visit: Payer: Self-pay | Admitting: *Deleted

## 2015-10-17 DIAGNOSIS — G894 Chronic pain syndrome: Secondary | ICD-10-CM

## 2015-10-17 NOTE — Telephone Encounter (Signed)
Fentanyl and Phentermine rx's up front [facility].  UDS ordered/fim

## 2015-10-19 ENCOUNTER — Other Ambulatory Visit (INDEPENDENT_AMBULATORY_CARE_PROVIDER_SITE_OTHER): Payer: Self-pay

## 2015-10-19 DIAGNOSIS — Z0289 Encounter for other administrative examinations: Secondary | ICD-10-CM

## 2015-10-19 DIAGNOSIS — G894 Chronic pain syndrome: Secondary | ICD-10-CM | POA: Diagnosis not present

## 2015-10-25 DIAGNOSIS — M25551 Pain in right hip: Secondary | ICD-10-CM | POA: Diagnosis not present

## 2015-10-25 DIAGNOSIS — M25552 Pain in left hip: Secondary | ICD-10-CM | POA: Diagnosis not present

## 2015-10-26 LAB — BENZODIAZEPINES CONFIRM, URINE
ALPRAZOLAM: NEGATIVE
BENZODIAZEPINES: POSITIVE ng/mL — AB
CLONAZEPAM: NEGATIVE
Flurazepam: NEGATIVE
LORAZEPAM: NEGATIVE
MIDAZOLAM: NEGATIVE
NORDIAZEPAM CONFIRM: 772 ng/mL
NORDIAZEPAM: POSITIVE — AB
OXAZEPAM GC/MS CONF: 2440 ng/mL
OXAZEPAM UR: POSITIVE — AB
TEMAZEPAM CONFIRM: 2310 ng/mL
TEMAZEPAM: POSITIVE — AB
TRIAZOLAM: NEGATIVE

## 2015-10-26 LAB — MONITOR DRUG PROFILE 10(MW)
AMPHETAMINE SCREEN URINE: NEGATIVE ng/mL
BARBITURATE SCREEN URINE: NEGATIVE ng/mL
Cocaine (Metab) Scrn, Ur: NEGATIVE ng/mL
Creatinine(Crt), U: 162.2 mg/dL (ref 20.0–300.0)
Methadone Screen, Urine: NEGATIVE ng/mL
OXYCODONE+OXYMORPHONE UR QL SCN: NEGATIVE ng/mL
Opiate Scrn, Ur: NEGATIVE ng/mL
PH UR, DRUG SCRN: 5.6 (ref 4.5–8.9)
PROPOXYPHENE SCREEN URINE: NEGATIVE ng/mL
Phencyclidine Qn, Ur: NEGATIVE ng/mL

## 2015-10-26 LAB — FENTANYL, URINE: Fentanyl, Urine: POSITIVE pg/mL

## 2015-10-26 LAB — CANNABINOID (GC/MS), URINE
CARBOXY THC UR: 414 ng/mL
Cannabinoid: POSITIVE — AB

## 2015-11-03 DIAGNOSIS — M87059 Idiopathic aseptic necrosis of unspecified femur: Secondary | ICD-10-CM | POA: Diagnosis not present

## 2015-11-03 DIAGNOSIS — G629 Polyneuropathy, unspecified: Secondary | ICD-10-CM | POA: Diagnosis not present

## 2015-11-03 DIAGNOSIS — G35 Multiple sclerosis: Secondary | ICD-10-CM | POA: Diagnosis not present

## 2015-11-03 DIAGNOSIS — K5900 Constipation, unspecified: Secondary | ICD-10-CM | POA: Diagnosis not present

## 2015-11-14 ENCOUNTER — Other Ambulatory Visit: Payer: Self-pay | Admitting: Neurology

## 2015-11-14 NOTE — Telephone Encounter (Signed)
Rx for lyrica [facility]ed to [facility]. Received a receipt of confirmation.

## 2015-11-21 ENCOUNTER — Telehealth: Payer: Self-pay | Admitting: Neurology

## 2015-11-21 ENCOUNTER — Other Ambulatory Visit: Payer: Self-pay | Admitting: Neurology

## 2015-11-21 MED ORDER — FENTANYL 25 MCG/HR TD PT72: 25.0000 ug | MEDICATED_PATCH | TRANSDERMAL | 0 refills | Status: DC

## 2015-11-21 NOTE — Telephone Encounter (Signed)
Rx. up front [facility]/fim 

## 2015-11-21 NOTE — Telephone Encounter (Signed)
Rx. awaiting RAS sig/fim 

## 2015-11-21 NOTE — Telephone Encounter (Signed)
Patient requesting refill of fentaNYL (DURAGESIC - DOSED MCG/HR) 25 MCG/HR patch Pharmacy: pick up

## 2015-12-20 ENCOUNTER — Telehealth: Payer: Self-pay | Admitting: Neurology

## 2015-12-20 MED ORDER — FENTANYL 25 MCG/HR TD PT72: 25.0000 ug | MEDICATED_PATCH | TRANSDERMAL | 0 refills | Status: DC

## 2015-12-20 NOTE — Telephone Encounter (Signed)
Fentanyl rx. up front [facility]/fim 

## 2015-12-20 NOTE — Telephone Encounter (Signed)
Rx. awaiting RAS sig/fim 

## 2015-12-20 NOTE — Telephone Encounter (Signed)
Pt request refill for fentaNYL (DURAGESIC - DOSED MCG/HR) 25 MCG/HR patch . She is wanting to pick up RX on Friday before noon

## 2016-01-11 ENCOUNTER — Ambulatory Visit: Payer: Medicare Other | Admitting: Neurology

## 2016-01-17 ENCOUNTER — Ambulatory Visit: Payer: Medicare Other | Admitting: Neurology

## 2016-01-19 ENCOUNTER — Telehealth: Payer: Self-pay | Admitting: Neurology

## 2016-01-19 MED ORDER — FENTANYL 25 MCG/HR TD PT72: 25.0000 ug | MEDICATED_PATCH | TRANSDERMAL | 0 refills | Status: DC

## 2016-01-19 NOTE — Telephone Encounter (Signed)
Patient requesting refill of  fentaNYL (DURAGESIC - DOSED MCG/HR) 25 MCG/HR patch. I advised the Rx will be ready on Monday and she said that is fine. The patient was upset because of having to call for this medication and having to take medication for pain. I wanted to be sure she was alright before hanging up and she was.

## 2016-01-19 NOTE — Telephone Encounter (Signed)
Rx. awaiting RAS sig/fim 

## 2016-01-19 NOTE — Telephone Encounter (Signed)
Fentanyl rx. up front [facility]/fim 

## 2016-01-19 NOTE — Addendum Note (Signed)
Addended by: Candis SchatzMISENHEIMER, Jaquarius Seder I on: 01/19/2016 11:51 AM   Modules accepted: Orders

## 2016-02-07 DIAGNOSIS — M87059 Idiopathic aseptic necrosis of unspecified femur: Secondary | ICD-10-CM | POA: Diagnosis not present

## 2016-02-07 DIAGNOSIS — E559 Vitamin D deficiency, unspecified: Secondary | ICD-10-CM | POA: Diagnosis not present

## 2016-02-07 DIAGNOSIS — J209 Acute bronchitis, unspecified: Secondary | ICD-10-CM | POA: Diagnosis not present

## 2016-02-07 DIAGNOSIS — K5900 Constipation, unspecified: Secondary | ICD-10-CM | POA: Diagnosis not present

## 2016-02-08 DIAGNOSIS — M87059 Idiopathic aseptic necrosis of unspecified femur: Secondary | ICD-10-CM | POA: Diagnosis not present

## 2016-02-08 DIAGNOSIS — K5900 Constipation, unspecified: Secondary | ICD-10-CM | POA: Diagnosis not present

## 2016-02-08 DIAGNOSIS — J209 Acute bronchitis, unspecified: Secondary | ICD-10-CM | POA: Diagnosis not present

## 2016-02-08 DIAGNOSIS — G35 Multiple sclerosis: Secondary | ICD-10-CM | POA: Diagnosis not present

## 2016-02-12 ENCOUNTER — Telehealth: Payer: Self-pay | Admitting: Neurology

## 2016-02-12 MED ORDER — TECFIDERA 240 MG PO CPDR: DELAYED_RELEASE_CAPSULE | ORAL | 3 refills | Status: DC

## 2016-02-12 NOTE — Telephone Encounter (Signed)
Pt needs RX for TECFIDERA 240 MG CPDR . She advised [facility] is new pharmacy. She said insurance is [employer]/medicare  ID# [id number] GRP# [id number]  RXBIN# [id number] GRP# [id number]. [facility] advised they could accept a VO- (p) [phone]  (f) [phone]. Pt is requesting a call also when this has been completed

## 2016-02-12 NOTE — Telephone Encounter (Signed)
Tecfidera rx. escribed to [facility] as requested/fim

## 2016-02-26 ENCOUNTER — Telehealth: Payer: Self-pay | Admitting: *Deleted

## 2016-02-26 MED ORDER — FENTANYL 25 MCG/HR TD PT72: 25.0000 ug | MEDICATED_PATCH | TRANSDERMAL | 0 refills | Status: DC

## 2016-02-26 NOTE — Telephone Encounter (Signed)
Rx. awaiting RAS sig/fim 

## 2016-02-26 NOTE — Telephone Encounter (Signed)
Rx. up front [facility]/fim 

## 2016-03-13 DIAGNOSIS — M87059 Idiopathic aseptic necrosis of unspecified femur: Secondary | ICD-10-CM | POA: Diagnosis not present

## 2016-03-13 DIAGNOSIS — Z7689 Persons encountering health services in other specified circumstances: Secondary | ICD-10-CM | POA: Diagnosis not present

## 2016-03-13 DIAGNOSIS — G35 Multiple sclerosis: Secondary | ICD-10-CM | POA: Diagnosis not present

## 2016-03-13 DIAGNOSIS — Z124 Encounter for screening for malignant neoplasm of cervix: Secondary | ICD-10-CM | POA: Diagnosis not present

## 2016-03-13 DIAGNOSIS — K5900 Constipation, unspecified: Secondary | ICD-10-CM | POA: Diagnosis not present

## 2016-03-21 ENCOUNTER — Other Ambulatory Visit: Payer: Self-pay | Admitting: Neurology

## 2016-03-26 ENCOUNTER — Telehealth: Payer: Self-pay | Admitting: Neurology

## 2016-03-26 MED ORDER — FENTANYL 25 MCG/HR TD PT72: 25.0000 ug | MEDICATED_PATCH | TRANSDERMAL | 0 refills | Status: DC

## 2016-03-26 NOTE — Addendum Note (Signed)
Addended by: Candis Schatz I on: 03/26/2016 04:18 PM   Modules accepted: Orders

## 2016-03-26 NOTE — Telephone Encounter (Signed)
Patient requesting refill of fentaNYL (DURAGESIC - DOSED MCG/HR) 25 MCG/HR patch. ° ° °

## 2016-03-26 NOTE — Telephone Encounter (Signed)
Rx. awaiting RAS sig/fim 

## 2016-03-27 NOTE — Telephone Encounter (Signed)
Rx. up front [facility]/fim 

## 2016-04-01 ENCOUNTER — Other Ambulatory Visit: Payer: Self-pay | Admitting: Neurology

## 2016-04-25 ENCOUNTER — Other Ambulatory Visit: Payer: Self-pay

## 2016-04-25 ENCOUNTER — Telehealth: Payer: Self-pay | Admitting: Neurology

## 2016-04-25 MED ORDER — FENTANYL 25 MCG/HR TD PT72
25.0000 ug | MEDICATED_PATCH | TRANSDERMAL | 0 refills | Status: DC
Start: 2016-04-25 — End: 2016-05-06

## 2016-04-25 NOTE — Telephone Encounter (Signed)
Rx left at front desk

## 2016-04-25 NOTE — Telephone Encounter (Signed)
RX done for fentanyl patch given to Dr. Epimenio Foot for signature will be at the front desk.

## 2016-04-25 NOTE — Telephone Encounter (Signed)
Patient called office requesting refill for fentaNYL (DURAGESIC - DOSED MCG/HR) 25 MCG/HR patch.  Patient advised office closes at noon on Friday.

## 2016-05-06 ENCOUNTER — Telehealth: Payer: Self-pay | Admitting: *Deleted

## 2016-05-06 ENCOUNTER — Encounter (INDEPENDENT_AMBULATORY_CARE_PROVIDER_SITE_OTHER): Payer: Self-pay

## 2016-05-06 ENCOUNTER — Ambulatory Visit (INDEPENDENT_AMBULATORY_CARE_PROVIDER_SITE_OTHER): Payer: Medicare Other | Admitting: Neurology

## 2016-05-06 ENCOUNTER — Encounter: Payer: Self-pay | Admitting: Neurology

## 2016-05-06 VITALS — BP 127/86 | HR 71 | Resp 18 | Ht 65.0 in | Wt 224.0 lb

## 2016-05-06 DIAGNOSIS — G4752 REM sleep behavior disorder: Secondary | ICD-10-CM | POA: Diagnosis not present

## 2016-05-06 DIAGNOSIS — G35 Multiple sclerosis: Secondary | ICD-10-CM | POA: Diagnosis not present

## 2016-05-06 DIAGNOSIS — M25559 Pain in unspecified hip: Secondary | ICD-10-CM

## 2016-05-06 DIAGNOSIS — R5382 Chronic fatigue, unspecified: Secondary | ICD-10-CM | POA: Diagnosis not present

## 2016-05-06 DIAGNOSIS — E559 Vitamin D deficiency, unspecified: Secondary | ICD-10-CM

## 2016-05-06 DIAGNOSIS — F32A Depression, unspecified: Secondary | ICD-10-CM

## 2016-05-06 DIAGNOSIS — G8929 Other chronic pain: Secondary | ICD-10-CM

## 2016-05-06 DIAGNOSIS — F329 Major depressive disorder, single episode, unspecified: Secondary | ICD-10-CM | POA: Diagnosis not present

## 2016-05-06 DIAGNOSIS — G4710 Hypersomnia, unspecified: Secondary | ICD-10-CM

## 2016-05-06 MED ORDER — PHENTERMINE HCL 37.5 MG PO TABS: 37.5000 mg | ORAL_TABLET | Freq: Every day | ORAL | 5 refills | Status: DC

## 2016-05-06 MED ORDER — FENTANYL 25 MCG/HR TD PT72: 25.0000 ug | MEDICATED_PATCH | TRANSDERMAL | 0 refills | Status: DC

## 2016-05-06 NOTE — Progress Notes (Signed)
[facility]  PATIENT: Joy Cobb DOB: 07/01/1980  REFERRING CLINICIAN: Greggory Stallion Osei-Bonsu  HISTORY FROM: [name] REASON FOR VISIT: MS and related symptoms   HISTORICAL  CHIEF COMPLAINT:  Chief Complaint  Patient presents with  . Multiple Sclerosis    Sts. she continues to toelrate Tecfidera well.  Sts. she is not able to afford Phentermine ($25 copay), so she takes it inconsitenlty--not at all in the last month.  Sts. fatigue, memory is worse.  She has gained 14lbs since last ov./fim    HISTORY OF PRESENT ILLNESS:  Joy Cobb is a 36 yo woman with MS diagnosed in 2012.   She is on Tecfidera and tolerates it well.     She deneis any new exacerbations.      Gait/strength/sensation:   She reports mild leg pain and has a lot of hip pain that limits her walking.  She notes tingling sometimes with pain in the feet. Her gait is mildly off but when she is tired her gait is worse. She has mild spasticity in her legs that has been helped by the baclofen. She notes pain  mostly in the legs and back. Pain is achy.   She takes Lyrica and is also on fentanyl for the pain.  Hip pain:   She reports pain in her hips and her back.   She reports her thighs hurt to the touch.     She has avascular necrosis and reports being told she will need replacement.   She had been on multiple courses of IV Solu-Medrol at the time of diagnosis (up in [place]).      Vision:   She denies any current problems with her eyes and feels that she recovered well from the optic neuritis.    She notes light sensitivity at night   Sleep:    Valium ws started at the last visit for bizarre sleep behavior recently, likely REM behavior disorder. She now talks some in sleep but no yelling or kicking.    Her PSG last year, did not show any parasomnias or OSA.    She has excessive daytime sleepiness.   .  She sleeps 5-6 hrs a night now which is better than last year.     Bladder:  She notes some  bladder frequency and urgency. She had been prescribed location in the past but she does not take it on a regular basis because this problem is not as bad as it had been in the past.  Mood/cognitive:   She notes less depression this year than last year.   She sees [employer].   She denies suicide ideation or plan.  Cognition is mildly impaired.  Specifically, she notes impaired short-term memory, attention, and word finding.   She loses items a lot. She'll sometimes read a paragraph and not remember what she had just read.     Fatigue/sleep/weight:  She continues to have a lot of fatigue and excessive sleepiness. She has gained 20 pounds in the past year.    Phentermine helped the fatigue and she noted she gained more weight when she stopped 2 months ago.      MS History:  She was diagnosed with multiple sclerosis in April 2012 after her second episode of optic neuritis. Her previous episode was about 7 or 8 months earlier and an MRI of the brain at that time did not show changes consistent with MS. After her second episode she had another MRI which showed some changes  in the brain consistent with MS. She received IV steroids for the second optic neuritis.  First episode was left and second episode was right.   A lumbar puncture showed the CSF was consistent with Multiple sclerosis.   She was initially placed on Betaseron. However, she continued to have exacerbations. Additionally, she had difficulty tolerating it well. In 2012, she had 3 other exacerbations while on Betaseron. At least some of them involved her spine and she had evidence on MRI and bilateral changes in her legs on exam. Due to the aggressiveness of the MS, she received 8 courses of IV Cytoxan while she was living in [place].  She then was switched to Tecfidera in 2013 and remains on that medication.  She moved to [facility] in 2013 and started to see Dr. Leotis Shames. When he moved further away, she transferred her care to me. In 2014,  she had some worsening gait that was felt to be due to an exacerbation that she receive a course of steroids. She has not had any further exacerbations while on Tecfidera.  Her last MRI was early 2015 at [facility].    REVIEW OF SYSTEMS:  Constitutional: No fevers, chills, sweats, or change in appetite.   Lost 29 pounds Eyes: No visual changes, double vision, eye pain Ear, nose and throat: No hearing loss, ear pain, nasal congestion, sore throat Cardiovascular: No chest pain, palpitations Respiratory:  No shortness of breath at rest or with exertion.   Snores but no OSA GastrointestinaI: Sh notes constipation.  No nausea, vomiting, diarrhea, abdominal pain, fecal incontinence Genitourinary:  Some urgency and frequency.. Musculoskeletal:  No neck pain, back pain Integumentary: No rash, pruritus, skin lesions Neurological: as above Psychiatric: see above Endocrine: No palpitations, diaphoresis, change in appetite, or increased thirst Hematologic/Lymphatic:  No anemia, purpura, petechiae. Allergic/Immunologic: No itchy/runny eyes, nasal congestion, recent allergic reactions, rashes  ALLERGIES: Allergies  Allergen Reactions  . Amoxicillin   . Penicillins Hives  . Temazepam     Sharp pains in stomach  . Septra [Sulfamethoxazole-Trimethoprim] Rash    HOME MEDICATIONS: Outpatient Medications Prior to Visit  Medication Sig Dispense Refill  . AFLURIA PRESERVATIVE FREE 0.5 ML SUSY   0  . baclofen (LIORESAL) 10 MG tablet TAKE 1 TABLET (10 MG TOTAL) BY MOUTH 3 (THREE) TIMES DAILY. 90 tablet 11  . CARAFATE 1 GM/10ML suspension Reported on 03/14/2015  3  . diazepam (VALIUM) 10 MG tablet TAKE 1 TABLET BY MOUTH AT BEDTIME 30 tablet 5  . drospirenone-ethinyl estradiol (YAZ,GIANVI,LORYNA) 3-0.02 MG tablet Take 1 tablet by mouth daily.    Marland Kitchen FLUoxetine (PROZAC) 40 MG capsule     . Ibuprofen 200 MG CAPS     . linaclotide (LINZESS) 290 MCG CAPS capsule Take 290 mcg by mouth daily before  breakfast.    . LYRICA 150 MG capsule TAKE ONE CAPSULE BY MOUTH EVERY MORNING AND TAKE 2 CAPSULES BY MOUTH EVERY DAY AT BEDTIME 90 capsule 5  . metoCLOPramide (REGLAN) 5 MG tablet   11  . TECFIDERA 240 MG CPDR Take 1 capsule by mouth twice per day 180 capsule 3  . Vitamin D, Ergocalciferol, (DRISDOL) 50000 units CAPS capsule Take 50,000 Units by mouth 2 (two) times a week.     . fentaNYL (DURAGESIC - DOSED MCG/HR) 25 MCG/HR patch Place 1 patch (25 mcg total) onto the skin every 3 (three) days. 10 patch 0  . DEXILANT 60 MG capsule     . phentermine (ADIPEX-P) 37.5 MG tablet Take  1 tablet (37.5 mg total) by mouth daily before breakfast. (Patient not taking: Reported on 05/06/2016) 30 tablet 5   No facility-administered medications prior to visit.     PAST MEDICAL HISTORY: Past Medical History:  Diagnosis Date  . Bladder spasms   . Colonic inertia   . IBS (irritable bowel syndrome)   . Multiple sclerosis (HCC)   . Neuropathy (HCC)   . Renal disorder   . Vision abnormalities     PAST SURGICAL HISTORY: Past Surgical History:  Procedure Laterality Date  . BREAST SURGERY    . CYSTECTOMY    . polyp removal      FAMILY HISTORY: Family History  Problem Relation Age of Onset  . Neuropathy Mother   . Leukemia Father     SOCIAL HISTORY:  Social History   Social History  . Marital status: Single    Spouse name: N/A  . Number of children: N/A  . Years of education: N/A   Occupational History  . Not on file.   Social History Main Topics  . Smoking status: Never Smoker  . Smokeless tobacco: Never Used  . Alcohol use No  . Drug use: No  . Sexual activity: Not on file   Other Topics Concern  . Not on file   Social History Narrative  . No narrative on file     PHYSICAL EXAM  Vitals:   05/06/16 1302  BP: 127/86  Pulse: 71  Resp: 18  Weight: 224 lb (101.6 kg)  Height: 5\' 5"  (1.651 m)    Body mass index is 37.28 kg/m.   General: The patient is well-developed  and well-nourished and in no acute distress  Neurologic Exam  Mental status: The patient is alert and oriented x 3 at the time of the examination. The patient has apparent normal recent and remote memory, with an apparently normal attention span and concentration ability.   Speech is normal.  Cranial nerves: Extraocular movements are full. Pupils show mild right APD.   No longer notes color desaturation.   Facial symmetry is present. There is reduced right facial sensation.  Facial strength is normal.  Trapezius and sternocleidomastoid strength is normal. No dysarthria is noted.  The tongue is midline, and the patient has symmetric elevation of the soft palate. No obvious hearing deficits are noted.  Motor:  Muscle bulk is normal. Tone is slightly increased in both legs.. Strength is  5 / 5 in all 4 extremities.   Sensory: Sensory testing is intact to touch, vibration sensation, in all 4 extremities.  Coordination: Cerebellar testing reveals good finger-nose-finger and slightly reduced heel-to-shin bilaterally.  Gait and station: Station is normal. Gait is slightly wide. Her tandem gait is mildly wide.. Romberg is negative.   Reflexes: Deep tendon reflexes are symmetric and increased with spread at both knees.    DIAGNOSTIC DATA (LABS, IMAGING, TESTING) - I reviewed patient records, labs, notes, testing and imaging myself where available.  Lab Results  Component Value Date   WBC 4.5 03/14/2015   HGB 13.1 02/14/2012   HCT 32.9 (L) 03/14/2015   MCV 83 03/14/2015   PLT 206 03/14/2015      Component Value Date/Time   NA 136 02/14/2012 2345   K 3.7 02/14/2012 2345   CL 99 02/14/2012 2345   CO2 29 02/14/2012 2345   GLUCOSE 98 02/14/2012 2345   BUN 13 02/14/2012 2345   CREATININE 0.76 02/14/2012 2345   CALCIUM 9.2 02/14/2012 2345   PROT 7.1 03/14/2015  1631   ALBUMIN 4.2 03/14/2015 1631   AST 13 03/14/2015 1631   ALT 11 03/14/2015 1631   ALKPHOS 74 03/14/2015 1631   BILITOT <0.2  03/14/2015 1631   GFRNONAA >90 02/14/2012 2345   GFRAA >90 02/14/2012 2345    Lab Results  Component Value Date   TSH 0.246 (L) 02/15/2012     ASSESSMENT AND PLAN   Multiple sclerosis (HCC) - Plan: CBC with Differential/Platelet  Chronic fatigue  Hypersomnia  REM behavioral disorder  Chronic hip pain, unspecified laterality  Depression, unspecified depression type  Vitamin D deficiency - Plan: VITAMIN D 25 Hydroxy (Vit-D Deficiency, Fractures)   1.   Continue Tecfidera.    Check CBC and Vit D 2.   Continue valium nightly for RBD.   Continue fentanyl and Lyrica for pain. 3.   I'll renew her fentanyl and phentermine.   She will return to see me in 4 months or sooner if she has new or worsening neurologic symptoms   Robertson Colclough A. Epimenio Foot, MD, PhD 05/06/2016, 2:53 PM Certified in Neurology, Clinical Neurophysiology, Sleep Medicine, Pain Medicine and Neuroimaging  [facility] [address], [address] [place], [place] [address] [phone]

## 2016-05-07 ENCOUNTER — Telehealth: Payer: Self-pay | Admitting: *Deleted

## 2016-05-07 LAB — CBC WITH DIFFERENTIAL/PLATELET
Basophils Absolute: 0 10*3/uL (ref 0.0–0.2)
Basos: 0 %
EOS (ABSOLUTE): 0.1 10*3/uL (ref 0.0–0.4)
Eos: 2 %
Hematocrit: 32.5 % — ABNORMAL LOW (ref 34.0–46.6)
Hemoglobin: 11.4 g/dL (ref 11.1–15.9)
Immature Grans (Abs): 0 10*3/uL (ref 0.0–0.1)
Immature Granulocytes: 0 %
Lymphocytes Absolute: 1.8 10*3/uL (ref 0.7–3.1)
Lymphs: 36 %
MCH: 29.3 pg (ref 26.6–33.0)
MCHC: 35.1 g/dL (ref 31.5–35.7)
MCV: 84 fL (ref 79–97)
Monocytes Absolute: 0.4 10*3/uL (ref 0.1–0.9)
Monocytes: 8 %
Neutrophils Absolute: 2.7 10*3/uL (ref 1.4–7.0)
Neutrophils: 54 %
Platelets: 213 10*3/uL (ref 150–379)
RBC: 3.89 x10E6/uL (ref 3.77–5.28)
RDW: 15.4 % (ref 12.3–15.4)
WBC: 5.1 10*3/uL (ref 3.4–10.8)

## 2016-05-07 LAB — VITAMIN D 25 HYDROXY (VIT D DEFICIENCY, FRACTURES): VIT D 25 HYDROXY: 23.7 ng/mL — AB (ref 30.0–100.0)

## 2016-05-07 NOTE — Telephone Encounter (Signed)
-----   Message from Richard A Sater, MD sent at 05/07/2016  3:20 PM EDT ----- Vit d is still a little low    Lets do 50000 U weekly cx 8 weeks then 5000 U OTC 

## 2016-05-07 NOTE — Telephone Encounter (Signed)
-----   Message from Asa Lente, MD sent at 05/07/2016  3:20 PM EDT ----- Vit d is still a little low    Lets do 50000 U weekly cx 8 weeks then 5000 U OTC

## 2016-05-07 NOTE — Telephone Encounter (Signed)
I have spoken with Joy Cobb this afternoon and per RAS, reviewed lab results as below.  She verbalized understanding of same. Rx. escribed [facility] per her request/fim

## 2016-05-14 ENCOUNTER — Other Ambulatory Visit: Payer: Self-pay | Admitting: Neurology

## 2016-06-24 DIAGNOSIS — G35 Multiple sclerosis: Secondary | ICD-10-CM | POA: Diagnosis not present

## 2016-06-24 DIAGNOSIS — M87059 Idiopathic aseptic necrosis of unspecified femur: Secondary | ICD-10-CM | POA: Diagnosis not present

## 2016-06-24 DIAGNOSIS — K5900 Constipation, unspecified: Secondary | ICD-10-CM | POA: Diagnosis not present

## 2016-06-24 DIAGNOSIS — Z0000 Encounter for general adult medical examination without abnormal findings: Secondary | ICD-10-CM | POA: Diagnosis not present

## 2016-06-25 ENCOUNTER — Telehealth: Payer: Self-pay | Admitting: Neurology

## 2016-06-25 MED ORDER — FENTANYL 25 MCG/HR TD PT72: 25.0000 ug | MEDICATED_PATCH | TRANSDERMAL | 0 refills | Status: DC

## 2016-06-25 NOTE — Telephone Encounter (Signed)
Rx. up front [facility]/fim 

## 2016-06-25 NOTE — Telephone Encounter (Signed)
Rx. awaiting RAS sig/fim 

## 2016-06-25 NOTE — Addendum Note (Signed)
Addended by: Candis Schatz I on: 06/25/2016 02:18 PM   Modules accepted: Orders

## 2016-06-25 NOTE — Telephone Encounter (Signed)
Patient called office requesting refill for fentaNYL (DURAGESIC - DOSED MCG/HR) 25 MCG/HR patch.

## 2016-07-24 ENCOUNTER — Telehealth: Payer: Self-pay | Admitting: Neurology

## 2016-07-24 MED ORDER — FENTANYL 25 MCG/HR TD PT72: 25.0000 ug | MEDICATED_PATCH | TRANSDERMAL | 0 refills | Status: DC

## 2016-07-24 NOTE — Telephone Encounter (Signed)
Pt calling for a refill of fentaNYL (DURAGESIC - DOSED MCG/HR) 25 MCG/HR patch Pt has one more to be changed tomorrow but will need to change by Sunday so calling today to pick up on Friday before 12

## 2016-07-24 NOTE — Addendum Note (Signed)
Addended by: Candis Schatz I on: 07/24/2016 05:00 PM   Modules accepted: Orders

## 2016-07-24 NOTE — Telephone Encounter (Signed)
Rx. awaiting RAS sig/fim 

## 2016-07-25 NOTE — Telephone Encounter (Signed)
Rx. up front [facility]/fim 

## 2016-08-26 ENCOUNTER — Telehealth: Payer: Self-pay | Admitting: Neurology

## 2016-08-26 MED ORDER — FENTANYL 25 MCG/HR TD PT72: 25.0000 ug | MEDICATED_PATCH | TRANSDERMAL | 0 refills | Status: DC

## 2016-08-26 NOTE — Telephone Encounter (Signed)
Rx. up front [facility]/fim 

## 2016-08-26 NOTE — Telephone Encounter (Signed)
Rx. awaiting RAS sig/fim 

## 2016-08-26 NOTE — Addendum Note (Signed)
Addended by: Candis Schatz I on: 08/26/2016 03:26 PM   Modules accepted: Orders

## 2016-08-26 NOTE — Telephone Encounter (Signed)
Patient requesting refill of fentaNYL (DURAGESIC - DOSED MCG/HR) 25 MCG/HR patch. ° ° °

## 2016-09-10 ENCOUNTER — Ambulatory Visit: Payer: Medicare Other | Admitting: Neurology

## 2016-09-19 ENCOUNTER — Other Ambulatory Visit: Payer: Self-pay | Admitting: Neurology

## 2016-09-23 ENCOUNTER — Telehealth: Payer: Self-pay | Admitting: Neurology

## 2016-09-23 MED ORDER — FENTANYL 25 MCG/HR TD PT72: 25.0000 ug | MEDICATED_PATCH | TRANSDERMAL | 0 refills | Status: DC

## 2016-09-23 NOTE — Addendum Note (Signed)
Addended by: Candis Schatz I on: 09/23/2016 02:03 PM   Modules accepted: Orders

## 2016-09-23 NOTE — Telephone Encounter (Signed)
Patient called office requesting refill request for fentaNYL (DURAGESIC - DOSED MCG/HR) 25 MCG/HR patch.

## 2016-09-23 NOTE — Telephone Encounter (Signed)
Rx. up front [facility]/fim 

## 2016-09-23 NOTE — Telephone Encounter (Signed)
Rx. awaiting RAS sig/fim 

## 2016-10-08 ENCOUNTER — Other Ambulatory Visit: Payer: Self-pay | Admitting: Physician Assistant

## 2016-10-08 DIAGNOSIS — Z1231 Encounter for screening mammogram for malignant neoplasm of breast: Secondary | ICD-10-CM

## 2016-10-23 ENCOUNTER — Ambulatory Visit: Payer: Medicare Other | Admitting: Neurology

## 2016-10-24 ENCOUNTER — Telehealth: Payer: Self-pay | Admitting: Neurology

## 2016-10-24 ENCOUNTER — Other Ambulatory Visit: Payer: Self-pay | Admitting: *Deleted

## 2016-10-24 DIAGNOSIS — Z79899 Other long term (current) drug therapy: Secondary | ICD-10-CM

## 2016-10-24 DIAGNOSIS — G894 Chronic pain syndrome: Secondary | ICD-10-CM

## 2016-10-24 MED ORDER — FENTANYL 25 MCG/HR TD PT72: 25.0000 ug | MEDICATED_PATCH | TRANSDERMAL | 0 refills | Status: DC

## 2016-10-24 NOTE — Telephone Encounter (Signed)
Rx. awaiting RAS sig/fim 

## 2016-10-24 NOTE — Telephone Encounter (Signed)
Rx. up front [facility]/fim 

## 2016-10-24 NOTE — Telephone Encounter (Signed)
Pt calling for refill of fentaNYL (DURAGESIC - DOSED MCG/HR) 25 MCG/HR patch

## 2016-10-24 NOTE — Addendum Note (Signed)
Addended by: Candis Schatz I on: 10/24/2016 12:12 PM   Modules accepted: Orders

## 2016-11-12 ENCOUNTER — Ambulatory Visit
Admission: RE | Admit: 2016-11-12 | Discharge: 2016-11-12 | Disposition: A | Discharge status: Home / Self Care | Payer: Medicare Other | Source: Ambulatory Visit | Attending: Physician Assistant | Admitting: Physician Assistant

## 2016-11-12 DIAGNOSIS — Z1231 Encounter for screening mammogram for malignant neoplasm of breast: Secondary | ICD-10-CM | POA: Diagnosis not present

## 2016-11-19 DIAGNOSIS — M722 Plantar fascial fibromatosis: Secondary | ICD-10-CM | POA: Diagnosis not present

## 2016-11-19 DIAGNOSIS — M87059 Idiopathic aseptic necrosis of unspecified femur: Secondary | ICD-10-CM | POA: Diagnosis not present

## 2016-11-19 DIAGNOSIS — G35 Multiple sclerosis: Secondary | ICD-10-CM | POA: Diagnosis not present

## 2016-11-19 DIAGNOSIS — K5900 Constipation, unspecified: Secondary | ICD-10-CM | POA: Diagnosis not present

## 2016-11-26 ENCOUNTER — Telehealth: Payer: Self-pay | Admitting: Neurology

## 2016-11-26 MED ORDER — FENTANYL 25 MCG/HR TD PT72: 25.0000 ug | MEDICATED_PATCH | TRANSDERMAL | 0 refills | Status: DC

## 2016-11-26 NOTE — Telephone Encounter (Signed)
Rx. awaiting RAS sig/fim 

## 2016-11-26 NOTE — Telephone Encounter (Signed)
Pt calling for a written prescription for her fentaNYL (DURAGESIC - DOSED MCG/HR) 25 MCG/HR patch

## 2016-11-26 NOTE — Telephone Encounter (Signed)
Rx. up front [facility]/fim 

## 2016-11-26 NOTE — Addendum Note (Signed)
Addended by: Candis Schatz I on: 11/26/2016 04:11 PM   Modules accepted: Orders

## 2016-11-27 ENCOUNTER — Other Ambulatory Visit (INDEPENDENT_AMBULATORY_CARE_PROVIDER_SITE_OTHER): Payer: Self-pay

## 2016-11-27 ENCOUNTER — Other Ambulatory Visit: Payer: Self-pay

## 2016-11-27 DIAGNOSIS — G894 Chronic pain syndrome: Secondary | ICD-10-CM | POA: Diagnosis not present

## 2016-11-27 DIAGNOSIS — Z79899 Other long term (current) drug therapy: Secondary | ICD-10-CM | POA: Diagnosis not present

## 2016-11-27 DIAGNOSIS — Z0289 Encounter for other administrative examinations: Secondary | ICD-10-CM

## 2016-12-04 LAB — DRUG SCREEN, UR (12+OXYCODONE+CRT)
AMPHETAMINE SCREEN URINE: NEGATIVE ng/mL
BARBITURATE SCREEN URINE: NEGATIVE ng/mL
BENZODIAZEPINE SCREEN, URINE: POSITIVE ng/mL — AB
CANNABINOIDS UR QL SCN: POSITIVE — AB
CREATININE(CRT), U: 206.7 mg/dL (ref 20.0–300.0)
Cocaine (Metab) Scrn, Ur: NEGATIVE ng/mL
Fentanyl, Urine: POSITIVE — AB
METHADONE SCREEN, URINE: NEGATIVE ng/mL
Meperidine Screen, Urine: NEGATIVE ng/mL
OXYCODONE+OXYMORPHONE UR QL SCN: NEGATIVE ng/mL
Opiate Scrn, Ur: NEGATIVE ng/mL
PH UR, DRUG SCRN: 8.4 (ref 4.5–8.9)
PHENCYCLIDINE QUANTITATIVE URINE: NEGATIVE ng/mL
Propoxyphene Scrn, Ur: NEGATIVE ng/mL
SPECIFIC GRAVITY: 1.024
TRAMADOL SCREEN, URINE: NEGATIVE ng/mL

## 2016-12-11 ENCOUNTER — Ambulatory Visit (INDEPENDENT_AMBULATORY_CARE_PROVIDER_SITE_OTHER): Payer: Medicare Other | Admitting: Neurology

## 2016-12-11 ENCOUNTER — Encounter: Payer: Self-pay | Admitting: Neurology

## 2016-12-11 VITALS — BP 147/91 | HR 79 | Resp 18 | Wt 245.0 lb

## 2016-12-11 DIAGNOSIS — G35 Multiple sclerosis: Secondary | ICD-10-CM | POA: Diagnosis not present

## 2016-12-11 DIAGNOSIS — F329 Major depressive disorder, single episode, unspecified: Secondary | ICD-10-CM | POA: Diagnosis not present

## 2016-12-11 DIAGNOSIS — R208 Other disturbances of skin sensation: Secondary | ICD-10-CM | POA: Diagnosis not present

## 2016-12-11 DIAGNOSIS — R260 Ataxic gait: Secondary | ICD-10-CM | POA: Diagnosis not present

## 2016-12-11 DIAGNOSIS — G4752 REM sleep behavior disorder: Secondary | ICD-10-CM | POA: Diagnosis not present

## 2016-12-11 DIAGNOSIS — R5382 Chronic fatigue, unspecified: Secondary | ICD-10-CM

## 2016-12-11 DIAGNOSIS — F32A Depression, unspecified: Secondary | ICD-10-CM

## 2016-12-11 MED ORDER — PREGABALIN 150 MG PO CAPS: ORAL_CAPSULE | ORAL | 5 refills | Status: DC

## 2016-12-11 MED ORDER — FENTANYL 25 MCG/HR TD PT72: 25.0000 ug | MEDICATED_PATCH | TRANSDERMAL | 0 refills | Status: DC

## 2016-12-11 NOTE — Progress Notes (Signed)
[facility]  PATIENT: Joy Cobb DOB: July 21, 1980  REFERRING CLINICIAN: Greggory Stallion Osei-Bonsu  HISTORY FROM: [name] REASON FOR VISIT: MS and related symptoms   HISTORICAL  CHIEF COMPLAINT:  Chief Complaint  Patient presents with  . Multiple Sclerosis    Sts. she continues to tolerate Tecfidera well.      HISTORY OF PRESENT ILLNESS:  Joy Cobb is a 36 yo woman with MS diagnosed in 2012.     Update 12/11/2016: She is on Tecfidera and tolerates it well.     She denies any new exacerbations.  Her gait is off balanced.   She has left leg weakness and spasticity.   Baclofen helps spasticity.    She has has left ankle pain.  Initially she had some swelling in it.   She was told she might have plantar fasciitis and also was found to have a bone spur.    Her PCP advised her to f/u with podiatry.   She did not take prednisone as she had AVN in the past (hips).   She has a lot of pain in her legs.  She is on Lyrica and fentanyl patches (25 mcg).    Last UDS that showed THC as well as the appropriate medications. As she is also on diazepam, I will keep the fentanyl dose of 25 g  She has depression.   She does not feel antidepressants like prozac have helped.  She feels she is doing worse. There is no suicidal ideation. She has been referred to [facility].   She was prescribed hydroxyzine.      Sleep is a problem.   She falls asleep easily with diazepam.   She has active dreams despite the diazepam (which only helped some).     Fatigue is better on phentermine but she did not lose weight so she stopped.      ___________________________________________________ Form 05/06/2016:   Gait/strength/sensation:   She reports mild leg pain and has a lot of hip pain that limits her walking.  She notes tingling sometimes with pain in the feet. Her gait is mildly off but when she is tired her gait is worse. She has mild spasticity in her legs that has been helped  by the baclofen. She notes pain  mostly in the legs and back. Pain is achy.   She takes Lyrica and is also on fentanyl for the pain.  Hip pain:   She reports pain in her hips and her back.   She reports her thighs hurt to the touch.     She has avascular necrosis and reports being told she will need replacement.   She had been on multiple courses of IV Solu-Medrol at the time of diagnosis (up in [place]).      Vision:   She denies any current problems with her eyes and feels that she recovered well from the optic neuritis.    She notes light sensitivity at night   Sleep:    Valium ws started at the last visit for bizarre sleep behavior recently, likely REM behavior disorder. She now talks some in sleep but no yelling or kicking.    Her PSG last year, did not show any parasomnias or OSA.    She has excessive daytime sleepiness.   .  She sleeps 5-6 hrs a night now which is better than last year.     Bladder:  She notes some bladder frequency and urgency. She had been prescribed location in the  past but she does not take it on a regular basis because this problem is not as bad as it had been in the past.  Mood/cognitive:   She notes less depression this year than last year.   [facility] sees [facility].   She denies suicide ideation or plan.  Cognition is mildly impaired.  Specifically, she notes impaired short-term memory, attention, and word finding.   She loses items a lot. She'll sometimes read a paragraph and not remember what she had just read.     Fatigue/sleep/weight:  She continues to have a lot of fatigue and excessive sleepiness. She has gained 20 pounds in the past year.    Phentermine helped the fatigue and she noted she gained more weight when she stopped 2 months ago.      MS History:  She was diagnosed with multiple sclerosis in April 2012 after her second episode of optic neuritis. Her previous episode was about 7 or 8 months earlier and an MRI of the brain at that time did not show  changes consistent with MS. After her second episode she had another MRI which showed some changes in the brain consistent with MS. She received IV steroids for the second optic neuritis.  First episode was left and second episode was right.   A lumbar puncture showed the CSF was consistent with Multiple sclerosis.   She was initially placed on Betaseron. However, she continued to have exacerbations. Additionally, she had difficulty tolerating it well. In 2012, she had 3 other exacerbations while on Betaseron. At least some of them involved her spine and she had evidence on MRI and bilateral changes in her legs on exam. Due to the aggressiveness of the MS, she received 8 courses of IV Cytoxan while she was living in [place].  She then was switched to Tecfidera in 2013 and remains on that medication.  [place] and started to see Dr. Leotis ShamesJeffery. When he moved further away, she transferred her care to me. In 2014, she had some worsening gait that was felt to be due to an exacerbation that she receive a course of steroids. She has not had any further exacerbations while on Tecfidera.  Her last MRI was early 2015 at [facility].    REVIEW OF SYSTEMS:  Constitutional: No fevers, chills, sweats, or change in appetite.   Lost 29 pounds Eyes: No visual changes, double vision, eye pain Ear, nose and throat: No hearing loss, ear pain, nasal congestion, sore throat Cardiovascular: No chest pain, palpitations Respiratory:  No shortness of breath at rest or with exertion.   Snores but no OSA GastrointestinaI: Sh notes constipation.  No nausea, vomiting, diarrhea, abdominal pain, fecal incontinence Genitourinary:  Some urgency and frequency.. Musculoskeletal:  No neck pain, back pain Integumentary: No rash, pruritus, skin lesions Neurological: as above Psychiatric: see above Endocrine: No palpitations, diaphoresis, change in appetite, or increased thirst Hematologic/Lymphatic:  No  anemia, purpura, petechiae. Allergic/Immunologic: No itchy/runny eyes, nasal congestion, recent allergic reactions, rashes  ALLERGIES: Allergies  Allergen Reactions  . Amoxicillin   . Penicillins Hives  . Temazepam     Sharp pains in stomach  . Septra [Sulfamethoxazole-Trimethoprim] Rash    HOME MEDICATIONS: Outpatient Medications Prior to Visit  Medication Sig Dispense Refill  . AFLURIA PRESERVATIVE FREE 0.5 ML SUSY   0  . baclofen (LIORESAL) 10 MG tablet TAKE 1 TABLET (10 MG TOTAL) BY MOUTH 3 (THREE) TIMES DAILY. 90 tablet 11  . CARAFATE 1  GM/10ML suspension Reported on 03/14/2015  3  . DEXILANT 60 MG capsule     . diazepam (VALIUM) 10 MG tablet TAKE 1 TABLET BY MOUTH AT BEDTIME 30 tablet 5  . drospirenone-ethinyl estradiol (YAZ,GIANVI,LORYNA) 3-0.02 MG tablet Take 1 tablet by mouth daily.    Marland Kitchen FLUoxetine (PROZAC) 40 MG capsule     . hydrOXYzine (VISTARIL) 25 MG capsule TAKE 1 CAPSULE BY MOUTH EVERYDAY AT BEDTIME  1  . Ibuprofen 200 MG CAPS     . linaclotide (LINZESS) 290 MCG CAPS capsule Take 290 mcg by mouth daily before breakfast.    . meloxicam (MOBIC) 15 MG tablet Take 15 mg daily by mouth.  2  . metoCLOPramide (REGLAN) 5 MG tablet   11  . TECFIDERA 240 MG CPDR Take 1 capsule by mouth twice per day 180 capsule 3  . Vitamin D, Ergocalciferol, (DRISDOL) 50000 units CAPS capsule Take 50,000 Units by mouth 2 (two) times a week.     . fentaNYL (DURAGESIC - DOSED MCG/HR) 25 MCG/HR patch Place 1 patch (25 mcg total) onto the skin every 3 (three) days. 10 patch 0  . LYRICA 150 MG capsule TAKE ONE CAPSULE BY MOUTH EVERY MORNING AND TAKE 2 CAPSULES BY MOUTH AT BEDTIME 90 capsule 5  . phentermine (ADIPEX-P) 37.5 MG tablet Take 1 tablet (37.5 mg total) by mouth daily before breakfast. 30 tablet 5   No facility-administered medications prior to visit.     PAST MEDICAL HISTORY: Past Medical History:  Diagnosis Date  . Bladder spasms   . Colonic inertia   . IBS (irritable bowel  syndrome)   . Multiple sclerosis (HCC)   . Neuropathy   . Renal disorder   . Vision abnormalities     PAST SURGICAL HISTORY: Past Surgical History:  Procedure Laterality Date  . BREAST SURGERY    . CYSTECTOMY    . polyp removal      FAMILY HISTORY: Family History  Problem Relation Age of Onset  . Neuropathy Mother   . Leukemia Father     SOCIAL HISTORY:  Social History   Socioeconomic History  . Marital status: Single    Spouse name: Not on file  . Number of children: Not on file  . Years of education: Not on file  . Highest education level: Not on file  Social Needs  . Financial resource strain: Not on file  . Food insecurity - worry: Not on file  . Food insecurity - inability: Not on file  . Transportation needs - medical: Not on file  . Transportation needs - non-medical: Not on file  Occupational History  . Not on file  Tobacco Use  . Smoking status: Never Smoker  . Smokeless tobacco: Never Used  Substance and Sexual Activity  . Alcohol use: No  . Drug use: No  . Sexual activity: Not on file  Other Topics Concern  . Not on file  Social History Narrative  . Not on file     PHYSICAL EXAM  Vitals:   12/11/16 1548  BP: (!) 147/91  Pulse: 79  Resp: 18  Weight: 245 lb (111.1 kg)    Body mass index is 40.77 kg/m.   General: The patient is well-developed and well-nourished and in no acute distress  Neurologic Exam  Mental status: The patient is alert and oriented x 3 at the time of the examination. The patient has apparent normal recent and remote memory, with an apparently normal attention span and concentration ability.  Speech is normal.  Cranial nerves: Extraocular movements are full. Pupils show mild right APD.   No longer notes color desaturation.   Facial symmetry is present. There is reduced right facial sensation.  Facial strength is normal. Trapezius strength is normal. The tongue is midline, and the patient has symmetric elevation of  the soft palate. No obvious hearing deficits are noted.  Motor:  Muscle bulk is normal. Tone is slightly increased in both legs.. Strength is  5 / 5 in all 4 extremities.   Sensory: Intact sensation to touch and vibration in the arms and legs.  Coordination: Cerebellar testing reveals good finger-nose-finger and slightly reduced heel-to-shin bilaterally.  Gait and station: Station is normal. The gait is mildly wide. Tandem gait is wide... Romberg is negative.   Reflexes: Deep tendon reflexes are symmetric and increased with spread at both knees.    DIAGNOSTIC DATA (LABS, IMAGING, TESTING) - I reviewed patient records, labs, notes, testing and imaging myself where available.  Lab Results  Component Value Date   WBC 5.1 05/06/2016   HGB 11.4 05/06/2016   HCT 32.5 (L) 05/06/2016   MCV 84 05/06/2016   PLT 213 05/06/2016      Component Value Date/Time   NA 136 02/14/2012 2345   K 3.7 02/14/2012 2345   CL 99 02/14/2012 2345   CO2 29 02/14/2012 2345   GLUCOSE 98 02/14/2012 2345   BUN 13 02/14/2012 2345   CREATININE 0.76 02/14/2012 2345   CALCIUM 9.2 02/14/2012 2345   PROT 7.1 03/14/2015 1631   ALBUMIN 4.2 03/14/2015 1631   AST 13 03/14/2015 1631   ALT 11 03/14/2015 1631   ALKPHOS 74 03/14/2015 1631   BILITOT <0.2 03/14/2015 1631   GFRNONAA >90 02/14/2012 2345   GFRAA >90 02/14/2012 2345    Lab Results  Component Value Date   TSH 0.246 (L) 02/15/2012     ASSESSMENT AND PLAN   Multiple sclerosis (HCC)  Ataxic gait  Chronic fatigue  Dysesthesia  REM behavioral disorder  Depression, unspecified depression type   1.   Continue Tecfidera.    Check CBC and Vit D.   Around the time of the next visit, we will check a brain MRI to make sure no subclinical progression.   If present, consider a change in therapy.  2.   Continue valium nightly for RBD.    3.   Recommend a brace for ankle and consider seeing podiatry if not better.   4.    Renew her low dose fentanyl  and Lyrica.    Prescriptions for controlled substances only coming from this office.     She will return to see me in 4 months or sooner if she has new or worsening neurologic symptoms  45 minutes face-to-face evaluation with greater than one half of the time counseling and coordinating care for her MS and related symptoms.  Ella Guillotte A. Epimenio Foot, MD, PhD 12/11/2016, 4:10 PM Certified in Neurology, Clinical Neurophysiology, Sleep Medicine, Pain Medicine and Neuroimaging  [facility] [address], [address] [place], [place] [address] [phone]

## 2016-12-12 ENCOUNTER — Telehealth: Payer: Self-pay

## 2016-12-12 LAB — CBC WITH DIFFERENTIAL/PLATELET
BASOS ABS: 0 10*3/uL (ref 0.0–0.2)
Basos: 0 %
EOS (ABSOLUTE): 0.1 10*3/uL (ref 0.0–0.4)
Eos: 1 %
HEMOGLOBIN: 11.7 g/dL (ref 11.1–15.9)
Hematocrit: 33.9 % — ABNORMAL LOW (ref 34.0–46.6)
IMMATURE GRANS (ABS): 0 10*3/uL (ref 0.0–0.1)
IMMATURE GRANULOCYTES: 0 %
LYMPHS: 44 %
Lymphocytes Absolute: 2.1 10*3/uL (ref 0.7–3.1)
MCH: 29.1 pg (ref 26.6–33.0)
MCHC: 34.5 g/dL (ref 31.5–35.7)
MCV: 84 fL (ref 79–97)
MONOCYTES: 6 %
Monocytes Absolute: 0.3 10*3/uL (ref 0.1–0.9)
NEUTROS PCT: 49 %
Neutrophils Absolute: 2.4 10*3/uL (ref 1.4–7.0)
PLATELETS: 227 10*3/uL (ref 150–379)
RBC: 4.02 x10E6/uL (ref 3.77–5.28)
RDW: 14.3 % (ref 12.3–15.4)
WBC: 4.8 10*3/uL (ref 3.4–10.8)

## 2016-12-12 NOTE — Telephone Encounter (Signed)
I spoke with patient and made her aware of results. She voiced understanding and appreciation.

## 2016-12-12 NOTE — Telephone Encounter (Signed)
-----   Message from Asa Lente, MD sent at 12/12/2016 10:21 AM EST ----- Please let the patient know that the lab work is fine.

## 2016-12-19 DIAGNOSIS — G629 Polyneuropathy, unspecified: Secondary | ICD-10-CM | POA: Diagnosis not present

## 2016-12-19 DIAGNOSIS — K5900 Constipation, unspecified: Secondary | ICD-10-CM | POA: Diagnosis not present

## 2016-12-19 DIAGNOSIS — G35 Multiple sclerosis: Secondary | ICD-10-CM | POA: Diagnosis not present

## 2016-12-19 DIAGNOSIS — M87059 Idiopathic aseptic necrosis of unspecified femur: Secondary | ICD-10-CM | POA: Diagnosis not present

## 2017-01-22 ENCOUNTER — Telehealth: Payer: Self-pay | Admitting: Neurology

## 2017-01-22 MED ORDER — FENTANYL 25 MCG/HR TD PT72: 25.0000 ug | MEDICATED_PATCH | TRANSDERMAL | 0 refills | Status: DC

## 2017-01-22 NOTE — Telephone Encounter (Signed)
Rx. up front [facility]/fim 

## 2017-01-22 NOTE — Telephone Encounter (Signed)
Rx. awaiting RAS sig/fim 

## 2017-01-22 NOTE — Telephone Encounter (Signed)
Pt request refill for fentaNYL (DURAGESIC - DOSED MCG/HR) 25 MCG/HR patch. Pt is wanting to pick it up tomorrow if possible.

## 2017-01-23 ENCOUNTER — Ambulatory Visit: Payer: Medicare Other | Admitting: Psychology

## 2017-01-23 DIAGNOSIS — M87059 Idiopathic aseptic necrosis of unspecified femur: Secondary | ICD-10-CM | POA: Diagnosis not present

## 2017-01-23 DIAGNOSIS — G35 Multiple sclerosis: Secondary | ICD-10-CM | POA: Diagnosis not present

## 2017-01-23 DIAGNOSIS — K5900 Constipation, unspecified: Secondary | ICD-10-CM | POA: Diagnosis not present

## 2017-01-23 DIAGNOSIS — G629 Polyneuropathy, unspecified: Secondary | ICD-10-CM | POA: Diagnosis not present

## 2017-02-24 ENCOUNTER — Telehealth: Payer: Self-pay | Admitting: Neurology

## 2017-02-24 MED ORDER — FENTANYL 25 MCG/HR TD PT72: 25.0000 ug | MEDICATED_PATCH | TRANSDERMAL | 0 refills | Status: DC

## 2017-02-24 NOTE — Telephone Encounter (Signed)
Rx. awaiting RAS sig/fim 

## 2017-02-24 NOTE — Telephone Encounter (Signed)
Rx. up front [facility]/fim 

## 2017-02-24 NOTE — Telephone Encounter (Signed)
Pt request refill for fentaNYL (DURAGESIC - DOSED MCG/HR) 25 MCG/HR patch. Pt will pick it up tomorrow afternoon.

## 2017-03-07 ENCOUNTER — Ambulatory Visit: Payer: Medicare Other | Admitting: Psychology

## 2017-03-26 ENCOUNTER — Telehealth: Payer: Self-pay | Admitting: Neurology

## 2017-03-26 MED ORDER — DIAZEPAM 10 MG PO TABS: 10.0000 mg | ORAL_TABLET | Freq: Every day | ORAL | 5 refills | Status: DC

## 2017-03-26 MED ORDER — FENTANYL 25 MCG/HR TD PT72: 25.0000 ug | MEDICATED_PATCH | TRANSDERMAL | 0 refills | Status: DC

## 2017-03-26 NOTE — Telephone Encounter (Signed)
Pt requesting a refill for fentaNYL (DURAGESIC - DOSED MCG/HR) 25 MCG/HR patch. Also needing a refill for diazepam (VALIUM) 10 [facility]

## 2017-03-26 NOTE — Telephone Encounter (Signed)
Rx. awaiting RAS sig/fim 

## 2017-03-26 NOTE — Telephone Encounter (Signed)
Both Fentanyl and Diazepam rx's up front [facility]/fim

## 2017-04-07 ENCOUNTER — Other Ambulatory Visit: Payer: Self-pay | Admitting: Neurology

## 2017-04-08 ENCOUNTER — Other Ambulatory Visit: Payer: Self-pay | Admitting: Neurology

## 2017-04-10 ENCOUNTER — Ambulatory Visit: Payer: Medicare Other | Admitting: Neurology

## 2017-04-23 ENCOUNTER — Telehealth: Payer: Self-pay | Admitting: Neurology

## 2017-04-23 MED ORDER — FENTANYL 25 MCG/HR TD PT72: 25.0000 ug | MEDICATED_PATCH | TRANSDERMAL | 0 refills | Status: DC

## 2017-04-23 NOTE — Telephone Encounter (Signed)
Pt requesting a refill for fentaNYL (DURAGESIC - DOSED MCG/HR) 25 MCG/HR patch

## 2017-04-24 NOTE — Telephone Encounter (Signed)
Rx. up front [facility]/fim 

## 2017-05-26 ENCOUNTER — Telehealth: Payer: Self-pay | Admitting: Neurology

## 2017-05-26 MED ORDER — FENTANYL 25 MCG/HR TD PT72: 25.0000 ug | MEDICATED_PATCH | TRANSDERMAL | 0 refills | Status: DC

## 2017-05-26 NOTE — Telephone Encounter (Signed)
Pt requesting a refill for fentaNYL (DURAGESIC - DOSED MCG/HR) 25 MCG/HR patch °

## 2017-05-27 NOTE — Telephone Encounter (Signed)
Rx. up front [facility]/fim 

## 2017-05-29 DIAGNOSIS — Z124 Encounter for screening for malignant neoplasm of cervix: Secondary | ICD-10-CM | POA: Diagnosis not present

## 2017-05-29 DIAGNOSIS — G35 Multiple sclerosis: Secondary | ICD-10-CM | POA: Diagnosis not present

## 2017-05-29 DIAGNOSIS — K5900 Constipation, unspecified: Secondary | ICD-10-CM | POA: Diagnosis not present

## 2017-05-29 DIAGNOSIS — M87059 Idiopathic aseptic necrosis of unspecified femur: Secondary | ICD-10-CM | POA: Diagnosis not present

## 2017-06-23 ENCOUNTER — Other Ambulatory Visit: Payer: Self-pay | Admitting: Neurology

## 2017-06-23 MED ORDER — FENTANYL 25 MCG/HR TD PT72: 25.0000 ug | MEDICATED_PATCH | TRANSDERMAL | 0 refills | Status: DC

## 2017-06-23 NOTE — Addendum Note (Signed)
Addended by: Rocky Link A on: 06/23/2017 11:53 AM   Modules accepted: Orders

## 2017-06-23 NOTE — Telephone Encounter (Signed)
Rx signed and ready at the front desk. Patient is aware.

## 2017-06-23 NOTE — Telephone Encounter (Signed)
Rx registry checked and her last Fentanyl refill was 05/27/17. Last OV was 12/11/16 and patient does not have a follow up scheduled. Rx printed and awaiting sig.

## 2017-06-23 NOTE — Telephone Encounter (Signed)
Pt has called for a refill for fentaNYL (DURAGESIC - DOSED MCG/HR) 25 MCG/HR patch Pt is aware to allow 24 hours for script to be ready and available up front

## 2017-06-24 DIAGNOSIS — K3184 Gastroparesis: Secondary | ICD-10-CM | POA: Diagnosis not present

## 2017-06-24 DIAGNOSIS — Z0000 Encounter for general adult medical examination without abnormal findings: Secondary | ICD-10-CM | POA: Diagnosis not present

## 2017-07-03 DIAGNOSIS — K3184 Gastroparesis: Secondary | ICD-10-CM | POA: Diagnosis not present

## 2017-07-08 DIAGNOSIS — G35 Multiple sclerosis: Secondary | ICD-10-CM | POA: Diagnosis not present

## 2017-07-08 DIAGNOSIS — M87059 Idiopathic aseptic necrosis of unspecified femur: Secondary | ICD-10-CM | POA: Diagnosis not present

## 2017-07-08 DIAGNOSIS — K5900 Constipation, unspecified: Secondary | ICD-10-CM | POA: Diagnosis not present

## 2017-07-08 DIAGNOSIS — Z0000 Encounter for general adult medical examination without abnormal findings: Secondary | ICD-10-CM | POA: Diagnosis not present

## 2017-07-09 DIAGNOSIS — G35 Multiple sclerosis: Secondary | ICD-10-CM | POA: Diagnosis not present

## 2017-07-09 DIAGNOSIS — K3184 Gastroparesis: Secondary | ICD-10-CM | POA: Diagnosis not present

## 2017-07-12 ENCOUNTER — Other Ambulatory Visit: Payer: Self-pay | Admitting: Neurology

## 2017-07-21 ENCOUNTER — Telehealth: Payer: Self-pay | Admitting: Neurology

## 2017-07-21 MED ORDER — FENTANYL 25 MCG/HR TD PT72: 25.0000 ug | MEDICATED_PATCH | TRANSDERMAL | 0 refills | Status: DC

## 2017-07-21 NOTE — Telephone Encounter (Signed)
Rx. up front [facility]/fim 

## 2017-07-21 NOTE — Telephone Encounter (Signed)
Pt request refill for fentaNYL (DURAGESIC - DOSED MCG/HR) 25 MCG/HR patch

## 2017-07-21 NOTE — Telephone Encounter (Signed)
Rx. awaiting RAS sig/fim 

## 2017-07-25 ENCOUNTER — Ambulatory Visit: Payer: Self-pay | Admitting: Neurology

## 2017-08-05 ENCOUNTER — Telehealth: Payer: Self-pay | Admitting: Neurology

## 2017-08-05 NOTE — Telephone Encounter (Signed)
Spoke with Joy Cobb.  She sts. she has more pain, feels worse on the 3rd day of wearing a Fentanyl patch, before she changes it.  Sts. she feels "like a whole different person" The day after changing the patch. I explained we do have some patients who say they feel the patch is some less effective on the 3rd day.  Joy Cobb verbalized understanding of same.  I advised she call pcp if she has concerns regarding Hydroxyzine  he rx'd, but she denied this, denied any emotional concerns. She has pending appt. with RAS on 7/10 and can discuss in greater detail at that time if needed/fim

## 2017-08-05 NOTE — Telephone Encounter (Signed)
Pt said she changes the fentanyl patch every 3 days and has noticed she is emotion just prior to time to change it. She said the only thing that is different is PCP has prescribed hydrOXYzine (VISTARIL) 25 MG capsule to take at night to help with sleep. She has been taking it since the latter part of 2018. Pt does not feel it is this medication although she said her mother said it is every 3 days she calls her very emotional. Please call to discuss

## 2017-08-13 ENCOUNTER — Ambulatory Visit: Payer: Medicare Other | Admitting: Neurology

## 2017-08-13 ENCOUNTER — Telehealth: Payer: Self-pay | Admitting: Neurology

## 2017-08-13 ENCOUNTER — Encounter: Payer: Self-pay | Admitting: Neurology

## 2017-08-13 VITALS — BP 116/75 | HR 76 | Wt 242.0 lb

## 2017-08-13 DIAGNOSIS — R350 Frequency of micturition: Secondary | ICD-10-CM | POA: Diagnosis not present

## 2017-08-13 DIAGNOSIS — G4752 REM sleep behavior disorder: Secondary | ICD-10-CM

## 2017-08-13 DIAGNOSIS — R260 Ataxic gait: Secondary | ICD-10-CM

## 2017-08-13 DIAGNOSIS — H469 Unspecified optic neuritis: Secondary | ICD-10-CM | POA: Diagnosis not present

## 2017-08-13 DIAGNOSIS — G35 Multiple sclerosis: Secondary | ICD-10-CM

## 2017-08-13 MED ORDER — FENTANYL 25 MCG/HR TD PT72: 25.0000 ug | MEDICATED_PATCH | TRANSDERMAL | 0 refills | Status: DC

## 2017-08-13 MED ORDER — DIAZEPAM 10 MG PO TABS: 10.0000 mg | ORAL_TABLET | Freq: Every day | ORAL | 5 refills | Status: DC

## 2017-08-13 MED ORDER — PREGABALIN 150 MG PO CAPS: ORAL_CAPSULE | ORAL | 5 refills | Status: DC

## 2017-08-13 NOTE — Telephone Encounter (Signed)
[employer] order sent to [facility]. No auth they will reach out to the pt to schedule.  °

## 2017-08-13 NOTE — Progress Notes (Signed)
[facility]  PATIENT: Joy Cobb DOB: July 10, 1980  REFERRING CLINICIAN: Greggory Stallion Osei-Bonsu  HISTORY FROM: [name] REASON FOR VISIT: MS and related symptoms   HISTORICAL  CHIEF COMPLAINT:  Chief Complaint  Patient presents with  . Follow-up    Patient doing well, still has ups and downs.     HISTORY OF PRESENT ILLNESS:  Joy Cobb is a 37 yo woman with MS diagnosed in 2012.     Update 08/13/2017: She feels her MS is mostly stable on Tecfidera.   She has no exacerbation.     Gait is ok with occasional stumbles.   Strength is ok.   She gets a buzzing sensation in her left hand and numbness in her feet     Bladder is doing ok.    She feels her pain is better on Fentanyl but that it is less helpful the last day of each 3 day patch.    She still feels she has done better on the patch than she did on pills in the past.     She is also on Lyrica  She is sometimes moody but denies worsening depression.   She has fatigue.     She has sometimes punched or kicked at night while asleep.   We discussed that she might have RBD.   She is on diazepam and has been on it a while so she is not sure how much if any if has helped.   Energy drinks help her fatigue some and she tries not to take later in the day.       Update 12/11/2016: She is on Tecfidera and tolerates it well.     She denies any new exacerbations.  Her gait is off balanced.   She has left leg weakness and spasticity.   Baclofen helps spasticity.    She has has left ankle pain.  Initially she had some swelling in it.   She was told she might have plantar fasciitis and also was found to have a bone spur.    Her PCP advised her to f/u with podiatry.   She did not take prednisone as she had AVN in the past (hips).   She has a lot of pain in her legs.  She is on Lyrica and fentanyl patches (25 mcg).    Last UDS that showed THC as well as the appropriate medications. As she is also on diazepam, I will keep the  fentanyl dose of 25 g  She has depression.   She does not feel antidepressants like prozac have helped.  She feels she is doing worse. There is no suicidal ideation. She has been referred to [facility].   She was prescribed hydroxyzine.      Sleep is a problem.   She falls asleep easily with diazepam.   She has active dreams despite the diazepam (which only helped some).     Fatigue is better on phentermine but she did not lose weight so she stopped.      ___________________________________________________ Form 05/06/2016:   Gait/strength/sensation:   She reports mild leg pain and has a lot of hip pain that limits her walking.  She notes tingling sometimes with pain in the feet. Her gait is mildly off but when she is tired her gait is worse. She has mild spasticity in her legs that has been helped by the baclofen. She notes pain  mostly in the legs and back. Pain is achy.   She  takes Lyrica and is also on fentanyl for the pain.  Hip pain:   She reports pain in her hips and her back.   She reports her thighs hurt to the touch.     She has avascular necrosis and reports being told she will need replacement.   She had been on multiple courses of IV Solu-Medrol at the time of diagnosis (up in [place]).      Vision:   She denies any current problems with her eyes and feels that she recovered well from the optic neuritis.    She notes light sensitivity at night   Sleep:    Valium ws started at the last visit for bizarre sleep behavior recently, likely REM behavior disorder. She now talks some in sleep but no yelling or kicking.    Her PSG last year, did not show any parasomnias or OSA.    She has excessive daytime sleepiness.   .  She sleeps 5-6 hrs a night now which is better than last year.     Bladder:  She notes some bladder frequency and urgency. She had been prescribed location in the past but she does not take it on a regular basis because this problem is not as bad as it had  been in the past.  Mood/cognitive:   She notes less depression this year than last year.   She sees [employer].   She denies suicide ideation or plan.  Cognition is mildly impaired.  Specifically, she notes impaired short-term memory, attention, and word finding.   She loses items a lot. She'll sometimes read a paragraph and not remember what she had just read.     Fatigue/sleep/weight:  She continues to have a lot of fatigue and excessive sleepiness. She has gained 20 pounds in the past year.    Phentermine helped the fatigue and she noted she gained more weight when she stopped 2 months ago.       MS History:  She was diagnosed with multiple sclerosis in April 2012 after her second episode of optic neuritis. Her previous episode was about 7 or 8 months earlier and an MRI of the brain at that time did not show changes consistent with MS. After her second episode she had another MRI which showed some changes in the brain consistent with MS. She received IV steroids for the second optic neuritis.  First episode was left and second episode was right.   A lumbar puncture showed the CSF was consistent with Multiple sclerosis.   She was initially placed on Betaseron. However, she continued to have exacerbations. Additionally, she had difficulty tolerating it well. In 2012, she had 3 other exacerbations while on Betaseron. At least some of them involved her spine and she had evidence on MRI and bilateral changes in her legs on exam. Due to the aggressiveness of the MS, she received 8 courses of IV Cytoxan while she was living in [place].  She then was switched to Tecfidera in 2013 and remains on that medication.  She moved to [facility] in 2013 and started to see Dr. Leotis Shames. When he moved further away, she transferred her care to me. In 2014, she had some worsening gait that was felt to be due to an exacerbation that she receive a course of steroids. She has not had any further exacerbations while on  Tecfidera.  Her last MRI was early 2015 at [facility].    REVIEW OF SYSTEMS:  Constitutional: No fevers, chills, sweats, or  change in appetite.   Lost 29 pounds Eyes: No visual changes, double vision, eye pain Ear, nose and throat: No hearing loss, ear pain, nasal congestion, sore throat Cardiovascular: No chest pain, palpitations Respiratory:  No shortness of breath at rest or with exertion.   Snores but no OSA GastrointestinaI: Sh notes constipation.  No nausea, vomiting, diarrhea, abdominal pain, fecal incontinence Genitourinary:  Some urgency and frequency.. Musculoskeletal:  No neck pain, back pain Integumentary: No rash, pruritus, skin lesions Neurological: as above Psychiatric: see above Endocrine: No palpitations, diaphoresis, change in appetite, or increased thirst Hematologic/Lymphatic:  No anemia, purpura, petechiae. Allergic/Immunologic: No itchy/runny eyes, nasal congestion, recent allergic reactions, rashes  ALLERGIES: Allergies  Allergen Reactions  . Amoxicillin   . Penicillins Hives  . Temazepam     Sharp pains in stomach  . Septra [Sulfamethoxazole-Trimethoprim] Rash    HOME MEDICATIONS: Outpatient Medications Prior to Visit  Medication Sig Dispense Refill  . baclofen (LIORESAL) 10 MG tablet TAKE 1 TABLET (10 MG TOTAL) BY MOUTH 3 (THREE) TIMES DAILY. 90 tablet 9  . CARAFATE 1 GM/10ML suspension Reported on 03/14/2015  3  . DEXILANT 60 MG capsule     . drospirenone-ethinyl estradiol (YAZ,GIANVI,LORYNA) 3-0.02 MG tablet Take 1 tablet by mouth daily.    Marland Kitchen FLUoxetine (PROZAC) 40 MG capsule     . hydrOXYzine (VISTARIL) 25 MG capsule TAKE 1 CAPSULE BY MOUTH EVERYDAY AT BEDTIME  1  . linaclotide (LINZESS) 290 MCG CAPS capsule Take 290 mcg by mouth daily before breakfast.    . meloxicam (MOBIC) 15 MG tablet Take 15 mg by mouth daily as needed.   2  . metoCLOPramide (REGLAN) 5 MG tablet   11  . TECFIDERA 240 MG CPDR TAKE 1 CAPSULE BY MOUTH TWICE A DAY 180  capsule 0  . AFLURIA PRESERVATIVE FREE 0.5 ML SUSY   0  . diazepam (VALIUM) 10 MG tablet Take 1 tablet (10 mg total) by mouth at bedtime. 30 tablet 5  . fentaNYL (DURAGESIC - DOSED MCG/HR) 25 MCG/HR patch Place 1 patch (25 mcg total) onto the skin every 3 (three) days. MUST KEEP PENDING 07/25/17 APPT. 10 patch 0  . Ibuprofen 200 MG CAPS     . pregabalin (LYRICA) 150 MG capsule One pill 2 or 3 times a day 90 capsule 5  . Vitamin D, Ergocalciferol, (DRISDOL) 50000 units CAPS capsule Take 50,000 Units by mouth 2 (two) times a week.      No facility-administered medications prior to visit.     PAST MEDICAL HISTORY: Past Medical History:  Diagnosis Date  . Bladder spasms   . Colonic inertia   . IBS (irritable bowel syndrome)   . Multiple sclerosis (HCC)   . Neuropathy   . Renal disorder   . Vision abnormalities     PAST SURGICAL HISTORY: Past Surgical History:  Procedure Laterality Date  . BREAST SURGERY    . CYSTECTOMY    . polyp removal      FAMILY HISTORY: Family History  Problem Relation Age of Onset  . Neuropathy Mother   . Leukemia Father     SOCIAL HISTORY:  Social History   Socioeconomic History  . Marital status: Single    Spouse name: Not on file  . Number of children: Not on file  . Years of education: Not on file  . Highest education level: Not on file  Occupational History  . Not on file  Social Needs  . Financial resource strain: Not on file  .  Food insecurity:    Worry: Not on file    Inability: Not on file  . Transportation needs:    Medical: Not on file    Non-medical: Not on file  Tobacco Use  . Smoking status: Never Smoker  . Smokeless tobacco: Never Used  Substance and Sexual Activity  . Alcohol use: No  . Drug use: No  . Sexual activity: Not on file  Lifestyle  . Physical activity:    Days per week: Not on file    Minutes per session: Not on file  . Stress: Not on file  Relationships  . Social connections:    Talks on phone: Not  on file    Gets together: Not on file    Attends religious service: Not on file    Active member of club or organization: Not on file    Attends meetings of clubs or organizations: Not on file    Relationship status: Not on file  . Intimate partner violence:    Fear of current or ex partner: Not on file    Emotionally abused: Not on file    Physically abused: Not on file    Forced sexual activity: Not on file  Other Topics Concern  . Not on file  Social History Narrative  . Not on file     PHYSICAL EXAM  Vitals:   08/13/17 1318  BP: 116/75  Pulse: 76  Weight: 242 lb (109.8 kg)    Body mass index is 40.27 kg/m.   General: The patient is well-developed and well-nourished and in no acute distress  Neurologic Exam  Mental status: The patient is alert and oriented x 3 at the time of the examination. The patient has apparent normal recent and remote memory, with an apparently normal attention span and concentration ability.   Speech is normal.  Cranial nerves: Extraocular movements are full.  She has a mild right APD.  Color saturation was symmetric.  Facial strength and sensation was normal.. The tongue is midline, and the patient has symmetric elevation of the soft palate. No obvious hearing deficits are noted.  Motor:  Muscle bulk is normal. Tone is slightly increased in both legs.. Strength is  5 / 5 in all 4 extremities.   Sensory: Intact sensation to touch and vibration in the arms and legs.  Coordination: Cerebellar testing reveals good finger-nose-finger and slightly reduced heel-to-shin bilaterally.  Gait and station: Station is normal.  The gait is mildly wide and the tandem gait is wide.. Romberg is negative.   Reflexes: Deep tendon reflexes are symmetric and increased at the knees with crossed adductors    DIAGNOSTIC DATA (LABS, IMAGING, TESTING) - I reviewed patient records, labs, notes, testing and imaging myself where available.  Lab Results  Component  Value Date   WBC 4.8 12/11/2016   HGB 11.7 12/11/2016   HCT 33.9 (L) 12/11/2016   MCV 84 12/11/2016   PLT 227 12/11/2016      Component Value Date/Time   NA 136 02/14/2012 2345   K 3.7 02/14/2012 2345   CL 99 02/14/2012 2345   CO2 29 02/14/2012 2345   GLUCOSE 98 02/14/2012 2345   BUN 13 02/14/2012 2345   CREATININE 0.76 02/14/2012 2345   CALCIUM 9.2 02/14/2012 2345   PROT 7.1 03/14/2015 1631   ALBUMIN 4.2 03/14/2015 1631   AST 13 03/14/2015 1631   ALT 11 03/14/2015 1631   ALKPHOS 74 03/14/2015 1631   BILITOT <0.2 03/14/2015 1631   GFRNONAA >  90 02/14/2012 2345   GFRAA >90 02/14/2012 2345      ASSESSMENT AND PLAN   Multiple sclerosis (HCC) - Plan: MR BRAIN W WO CONTRAST, CBC with Differential/Platelet  Ataxic gait  Urinary frequency  REM behavioral disorder  Optic neuritis   1.   Continue Tecfidera.  Check CBC.  Check Brain MRI to determine if any subclinical progression.  If there are subclinical progression changes, consider a different disease modifying therapy. 2.   Continue valium nightly for RBD.    3.   Renew her low dose fentanyl, diazepam and Lyrica.    Prescriptions for controlled substances only coming from this office.     She will return to see me in 4 months or sooner if she has new or worsening neurologic symptoms   Joy Cobb A. Epimenio Foot, MD, PhD 08/13/2017, 2:42 PM Certified in Neurology, Clinical Neurophysiology, Sleep Medicine, Pain Medicine and Neuroimaging  [facility] [address] [place], [place] [address] [phone]

## 2017-08-14 ENCOUNTER — Telehealth: Payer: Self-pay | Admitting: *Deleted

## 2017-08-14 LAB — CBC WITH DIFFERENTIAL/PLATELET
BASOS ABS: 0 10*3/uL (ref 0.0–0.2)
Basos: 0 %
EOS (ABSOLUTE): 0 10*3/uL (ref 0.0–0.4)
Eos: 1 %
Hematocrit: 32.4 % — ABNORMAL LOW (ref 34.0–46.6)
Hemoglobin: 11.1 g/dL (ref 11.1–15.9)
Immature Grans (Abs): 0 10*3/uL (ref 0.0–0.1)
Immature Granulocytes: 0 %
LYMPHS ABS: 1.5 10*3/uL (ref 0.7–3.1)
Lymphs: 30 %
MCH: 29.5 pg (ref 26.6–33.0)
MCHC: 34.3 g/dL (ref 31.5–35.7)
MCV: 86 fL (ref 79–97)
MONOCYTES: 7 %
MONOS ABS: 0.3 10*3/uL (ref 0.1–0.9)
Neutrophils Absolute: 3.2 10*3/uL (ref 1.4–7.0)
Neutrophils: 62 %
Platelets: 208 10*3/uL (ref 150–450)
RBC: 3.76 x10E6/uL — AB (ref 3.77–5.28)
RDW: 14 % (ref 12.3–15.4)
WBC: 5.1 10*3/uL (ref 3.4–10.8)

## 2017-08-14 NOTE — Telephone Encounter (Signed)
-----   Message from Richard A Sater, MD sent at 08/14/2017  9:48 AM EDT ----- Please let the patient know that the lab work is fine.  

## 2017-08-14 NOTE — Telephone Encounter (Signed)
Spoke with Joy Cobb and reviewed below lab results.  She verbalized understanding of same/fim

## 2017-08-22 ENCOUNTER — Encounter: Payer: Self-pay | Admitting: Endocrinology

## 2017-08-22 DIAGNOSIS — M87059 Idiopathic aseptic necrosis of unspecified femur: Secondary | ICD-10-CM | POA: Diagnosis not present

## 2017-08-22 DIAGNOSIS — K5900 Constipation, unspecified: Secondary | ICD-10-CM | POA: Diagnosis not present

## 2017-08-22 DIAGNOSIS — G35 Multiple sclerosis: Secondary | ICD-10-CM | POA: Diagnosis not present

## 2017-08-27 ENCOUNTER — Other Ambulatory Visit: Payer: Self-pay | Admitting: Physician Assistant

## 2017-08-27 DIAGNOSIS — E559 Vitamin D deficiency, unspecified: Secondary | ICD-10-CM | POA: Diagnosis not present

## 2017-08-27 DIAGNOSIS — R7989 Other specified abnormal findings of blood chemistry: Secondary | ICD-10-CM | POA: Diagnosis not present

## 2017-08-27 DIAGNOSIS — E349 Endocrine disorder, unspecified: Secondary | ICD-10-CM | POA: Diagnosis not present

## 2017-08-27 DIAGNOSIS — G35 Multiple sclerosis: Secondary | ICD-10-CM | POA: Diagnosis not present

## 2017-08-27 DIAGNOSIS — N939 Abnormal uterine and vaginal bleeding, unspecified: Secondary | ICD-10-CM

## 2017-09-05 ENCOUNTER — Ambulatory Visit
Admission: RE | Admit: 2017-09-05 | Discharge: 2017-09-05 | Disposition: A | Discharge status: Home / Self Care | Payer: Medicare Other | Source: Ambulatory Visit | Attending: Physician Assistant | Admitting: Physician Assistant

## 2017-09-05 DIAGNOSIS — N939 Abnormal uterine and vaginal bleeding, unspecified: Secondary | ICD-10-CM

## 2017-09-08 ENCOUNTER — Ambulatory Visit
Admission: RE | Admit: 2017-09-08 | Discharge: 2017-09-08 | Disposition: A | Discharge status: Home / Self Care | Payer: Medicare Other | Source: Ambulatory Visit | Attending: Neurology | Admitting: Neurology

## 2017-09-08 DIAGNOSIS — G35 Multiple sclerosis: Secondary | ICD-10-CM

## 2017-09-08 MED ORDER — GADOBENATE DIMEGLUMINE 529 MG/ML IV SOLN
20.0000 mL | Freq: Once | INTRAVENOUS | Status: AC | PRN
  Administered 2017-09-08: 20 mL via INTRAVENOUS

## 2017-09-09 ENCOUNTER — Telehealth: Payer: Self-pay | Admitting: *Deleted

## 2017-09-09 NOTE — Telephone Encounter (Signed)
Spoke with Joy Cobb and reviewed below MRI results.  She verbalized understanding of same/fim

## 2017-09-09 NOTE — Telephone Encounter (Signed)
-----   Message from Asa Lente, MD sent at 09/08/2017  5:52 PM EDT ----- Please let the patient know that the MRI does not show any new MS lesions.  The pituitary gland looked fine.

## 2017-09-17 ENCOUNTER — Telehealth: Payer: Self-pay | Admitting: Neurology

## 2017-09-17 ENCOUNTER — Other Ambulatory Visit: Payer: Self-pay | Admitting: *Deleted

## 2017-09-17 DIAGNOSIS — Z79899 Other long term (current) drug therapy: Secondary | ICD-10-CM

## 2017-09-17 DIAGNOSIS — M25552 Pain in left hip: Secondary | ICD-10-CM

## 2017-09-17 DIAGNOSIS — M25551 Pain in right hip: Secondary | ICD-10-CM

## 2017-09-17 DIAGNOSIS — R208 Other disturbances of skin sensation: Secondary | ICD-10-CM

## 2017-09-17 DIAGNOSIS — G8929 Other chronic pain: Secondary | ICD-10-CM

## 2017-09-17 MED ORDER — FENTANYL 25 MCG/HR TD PT72: 25.0000 ug | MEDICATED_PATCH | TRANSDERMAL | 0 refills | Status: DC

## 2017-09-17 NOTE — Telephone Encounter (Signed)
Rx. up front [facility]/fim 

## 2017-09-17 NOTE — Telephone Encounter (Signed)
Rx. awaiting RAS sig/fim 

## 2017-09-17 NOTE — Telephone Encounter (Signed)
Pt called stating she is needing a refill for fentaNYL (DURAGESIC - DOSED MCG/HR) 25 MCG/HR patch. Stating it is due on Sunday 8/18 but is hoping to pick up Rx tomorrow or Friday. Please call to advise

## 2017-09-19 ENCOUNTER — Other Ambulatory Visit: Payer: Medicare Other

## 2017-09-19 ENCOUNTER — Encounter (INDEPENDENT_AMBULATORY_CARE_PROVIDER_SITE_OTHER): Payer: Self-pay

## 2017-09-19 DIAGNOSIS — M25552 Pain in left hip: Secondary | ICD-10-CM

## 2017-09-19 DIAGNOSIS — G8929 Other chronic pain: Secondary | ICD-10-CM | POA: Diagnosis not present

## 2017-09-19 DIAGNOSIS — Z79899 Other long term (current) drug therapy: Secondary | ICD-10-CM

## 2017-09-19 DIAGNOSIS — R208 Other disturbances of skin sensation: Secondary | ICD-10-CM

## 2017-09-19 DIAGNOSIS — M25551 Pain in right hip: Secondary | ICD-10-CM

## 2017-09-20 LAB — MED LIST OPTION NOT SELECTED

## 2017-09-24 DIAGNOSIS — E349 Endocrine disorder, unspecified: Secondary | ICD-10-CM | POA: Diagnosis not present

## 2017-09-27 LAB — DRUG SCREEN, UR (12+OXYCODONE+CRT)
Amphetamine Scrn, Ur: NEGATIVE ng/mL
BARBITURATE SCREEN URINE: NEGATIVE ng/mL
BENZODIAZEPINE SCREEN, URINE: POSITIVE ng/mL — AB
CANNABINOIDS UR QL SCN: POSITIVE — AB
CREATININE(CRT), U: 348.8 mg/dL — AB (ref 20.0–300.0)
Cocaine (Metab) Scrn, Ur: NEGATIVE ng/mL
FENTANYL, URINE: POSITIVE — AB
MEPERIDINE SCREEN, URINE: NEGATIVE ng/mL
METHADONE SCREEN, URINE: NEGATIVE ng/mL
OPIATE SCREEN URINE: NEGATIVE ng/mL
OXYCODONE+OXYMORPHONE UR QL SCN: NEGATIVE ng/mL
PHENCYCLIDINE QUANTITATIVE URINE: NEGATIVE ng/mL
Ph of Urine: 5.9 (ref 4.5–8.9)
Propoxyphene Scrn, Ur: NEGATIVE ng/mL
SPECIFIC GRAVITY: 1.025
Tramadol Screen, Urine: NEGATIVE ng/mL

## 2017-10-01 ENCOUNTER — Other Ambulatory Visit: Payer: Self-pay | Admitting: Physician Assistant

## 2017-10-01 DIAGNOSIS — R9389 Abnormal findings on diagnostic imaging of other specified body structures: Secondary | ICD-10-CM

## 2017-10-15 ENCOUNTER — Ambulatory Visit
Admission: RE | Admit: 2017-10-15 | Discharge: 2017-10-15 | Disposition: A | Discharge status: Home / Self Care | Payer: Medicare Other | Source: Ambulatory Visit | Attending: Physician Assistant | Admitting: Physician Assistant

## 2017-10-15 DIAGNOSIS — R9389 Abnormal findings on diagnostic imaging of other specified body structures: Secondary | ICD-10-CM

## 2017-10-15 MED ORDER — GADOBENATE DIMEGLUMINE 529 MG/ML IV SOLN
20.0000 mL | Freq: Once | INTRAVENOUS | Status: AC | PRN
  Administered 2017-10-15: 20 mL via INTRAVENOUS

## 2017-10-17 ENCOUNTER — Encounter: Payer: Self-pay | Admitting: Endocrinology

## 2017-10-17 ENCOUNTER — Ambulatory Visit (INDEPENDENT_AMBULATORY_CARE_PROVIDER_SITE_OTHER): Payer: Medicare Other | Admitting: Endocrinology

## 2017-10-17 VITALS — BP 127/83 | HR 82 | Temp 98.5°F | Ht 65.0 in | Wt 238.4 lb

## 2017-10-17 DIAGNOSIS — Z23 Encounter for immunization: Secondary | ICD-10-CM

## 2017-10-17 DIAGNOSIS — R7989 Other specified abnormal findings of blood chemistry: Secondary | ICD-10-CM

## 2017-10-17 DIAGNOSIS — N912 Amenorrhea, unspecified: Secondary | ICD-10-CM | POA: Diagnosis not present

## 2017-10-17 NOTE — Progress Notes (Signed)
Patient ID: Joy Cobb, female   DOB: [birth year], 37 y.o.   MRN: [id number]          Referring physician: Vanna Scotland  Chief complaint: Tiredness  History of Present Illness:    She was referred by her primary care provider because of history of lack of menstrual cycles for about 15 years and concern about low FSH level Patient has been on birth control pills since her late teens About 15 years ago she was switched to a daily birth control pills instead of taking the active pills 3 days a week Since then she has been not having any menstrual cycles  She was recently evaluated by her PCP and had FSH and LH levels checked which were low normal, these were done while she is on birth control pills Also had a normal TSH level She has not had any symptoms of milk from her breasts and no recent pregnancy  She also does not complain of any hair loss, increased facial hair, unusual weight gain or bruising She does complain of tiredness which is not new   Past Medical History:  Diagnosis Date  . Bladder spasms   . Colonic inertia   . IBS (irritable bowel syndrome)   . Multiple sclerosis (HCC)   . Neuropathy   . Renal disorder   . Vision abnormalities     Past Surgical History:  Procedure Laterality Date  . BREAST SURGERY    . CYSTECTOMY    . polyp removal      Family History  Problem Relation Age of Onset  . Neuropathy Mother   . Leukemia Father   . Diabetes Maternal Grandmother   . Thyroid disease Maternal Aunt     Social History:  reports that she has never smoked. She has never used smokeless tobacco. She reports that she does not drink alcohol or use drugs.  Allergies:  Allergies  Allergen Reactions  . Amoxicillin   . Penicillins Hives  . Temazepam     Sharp pains in stomach  . Septra [Sulfamethoxazole-Trimethoprim] Rash    Allergies as of 10/17/2017      Reactions   Amoxicillin    Penicillins Hives   Temazepam    Sharp pains in stomach   Septra  [sulfamethoxazole-trimethoprim] Rash      Medication List        Accurate as of 10/17/17  4:38 PM. Always use your most recent med list.          baclofen 10 MG tablet Commonly known as:  LIORESAL TAKE 1 TABLET (10 MG TOTAL) BY MOUTH 3 (THREE) TIMES DAILY.   CARAFATE 1 GM/10ML suspension Generic drug:  sucralfate Reported on 03/14/2015   diazepam 10 MG tablet Commonly known as:  VALIUM Take 1 tablet (10 mg total) by mouth at bedtime.   drospirenone-ethinyl estradiol 3-0.02 MG tablet Commonly known as:  YAZ,GIANVI,LORYNA Take 1 tablet by mouth daily.   fentaNYL 25 MCG/HR patch Commonly known as:  DURAGESIC - dosed mcg/hr Place 1 patch (25 mcg total) onto the skin every 3 (three) days.   FLUoxetine 40 MG capsule Commonly known as:  PROZAC   hydrOXYzine 25 MG capsule Commonly known as:  VISTARIL TAKE 1 CAPSULE BY MOUTH EVERYDAY AT BEDTIME   LINZESS 290 MCG Caps capsule Generic drug:  linaclotide Take 290 mcg by mouth daily before breakfast.   meloxicam 15 MG tablet Commonly known as:  MOBIC Take 15 mg by mouth daily as needed.   pregabalin 150  MG capsule Commonly known as:  LYRICA One pill 2 or 3 times a day   TECFIDERA 240 MG Cpdr Generic drug:  Dimethyl Fumarate TAKE 1 CAPSULE BY MOUTH TWICE A DAY   Vitamin D (Ergocalciferol) 50000 units Caps capsule Commonly known as:  DRISDOL Take 50,000 Units by mouth 2 (two) times a week.       LABS:  No visits with results within 1 Week(s) from this visit.  Latest known visit with results is:  Lab on 09/19/2017  Component Date Value Ref Range Status  . Amphetamine Scrn, Ur 09/19/2017 Negative  Cutoff=1000 ng/mL Final  . BARBITURATE SCREEN URINE 09/19/2017 Negative  Cutoff=200 ng/mL Final  . BENZODIAZEPINE SCREEN, URINE 09/19/2017 Positive* Cutoff=100 ng/mL Final   Comment:   Nordiazepam               Positive        A                         01    Nordiazepam Confirm      1483               ng/mL Cutoff=100        01 Nordiazepam detected; this finding is consistent with use of medications that include Valium, Librium, Tranxene, Paxipam, Centrax, or generic formulations. Drugs listed are representative of common sources of the compound detected and are not intended to include all possible sources.   Oxazepam                  Positive        A                         01    Oxazepam Confirm         5697               ng/mL Cutoff=100       01 Oxazepam detected; this finding is consistent with use of medications that include Serax, Valium, Librium, Tranzene, Paxipam, Centrax, Restoril, or generic formulations. Drugs listed are representative of common sources of the compound detected and are not intended to include all possible sources.   Flurazepam                Negative            Cutoff=100            01   Lorazepam                 Negative                                      Cutoff=100            01   Alprazolam                Negative            Cutoff=100            01   Clonazepam                Negative            Cutoff=100            01   Temazepam  Positive        A                         01    Temazepam Confirm        7515               ng/mL Cutoff=100       01 Temazepam detected; this finding is consistent with use of medications that include Restoril, Normison, Valium, or generic formulations. Drugs listed are representative of common sources of the compound detected and are not intended to include all possible sources.   Triazolam                 Negative            Cutoff=100            01   Midazolam                 Negative            Cutoff=100            01   . CANNABINOIDS UR QL SCN 09/19/2017 Positive* Cutoff=20 Final     Carboxy THC (GC/MS)       >400               ng/mL Cutoff=10        01  . Cocaine (Metab) Scrn, Ur 09/19/2017 Negative  Cutoff=300 ng/mL Final  . Opiate Scrn, Ur 09/19/2017 Negative  Cutoff=300 ng/mL Final   Opiate test includes Codeine,  Morphine, Hydromorphone, Hydrocodone.  Salvatore Marvel UR QL SCN 09/19/2017 Negative  Cutoff=100 ng/mL Final   Test includes Oxycodone and Oxymorphone  . Phencyclidine Qn, Ur 09/19/2017 Negative  Cutoff=25 ng/mL Final  . Methadone Screen, Urine 09/19/2017 Negative  Cutoff=300 ng/mL Final  . Propoxyphene Scrn, Ur 09/19/2017 Negative  Cutoff=300 ng/mL Final  . Meperidine Screen, Urine 09/19/2017 Negative  Cutoff=200 ng/mL Final   Comment: This test was developed and its performance characteristics determined by [facility]. It has not been cleared or approved by the Food and Drug Administration.   . Fentanyl, Urine 09/19/2017 Positive* Cutoff=2000 Final   Comment: Test includes Fentanyl and Norfentanyl Fentanyl                    Positive        A                         01 Fentanyl Confirm            >50000             pg/mL Cutoff=500       01 Norfentanyl                 Positive        A                         01 Norfentanyl Confirm         >90000             pg/mL Cutoff=500       01 Norfentanyl detected; this finding can be consistent with the use of medications that include Actiq, Duragesic, Fentora, Sublimaze, or generic formulations. Drugs listed are representative of common sources of the compound detected and are not intended to include all possible sources.   Marland Kitchen  Tramadol Screen, Urine 09/19/2017 Negative  Cutoff=200 ng/mL Final  . Creatinine(Crt), U 09/19/2017 348.8* 20.0 - 300.0 mg/dL Final  . SPECIFIC GRAVITY 09/19/2017 1.025   Final  . Ph of Urine 09/19/2017 5.9  4.5 - 8.9 Final  . Please Note: 09/19/2017 Comment   Final   Comment: Drug-test results should be interpreted in the context of clinical information. Patient metabolic variables, specific drug chemistry, and specimen characteristics can affect test outcome. Technical consultation is available if a test result is inconsistent with an expected outcome. ([email or web address] or call  toll-free [phone]) Drug brands, if listed herein, are trademarks of their respective owners.   . Med List Option Not Selected 09/19/2017 WILL FOLLOW   Preliminary        Review of Systems  Constitutional: Negative for weight loss and weight gain.  HENT: Negative for headaches.   Eyes: Negative for visual disturbance.  Respiratory: Positive for shortness of breath.   Gastrointestinal: Positive for nausea.  Endocrine: Positive for fatigue. Negative for breast discharge.  Genitourinary: Positive for nocturia.  Psychiatric/Behavioral: Positive for insomnia.     PHYSICAL EXAM:  BP 127/83 (BP Location: Left Arm, Patient Position: Sitting, Cuff Size: Normal)   Pulse 82   Temp 98.5 F (36.9 C) (Oral)   Ht 5\' 5"  (1.651 m)   Wt 238 lb 6.4 oz (108.1 kg)   SpO2 96%   BMI 39.67 kg/m   GENERAL: Moderate generalized obesity. No cushingoid features No thinning of the skin  No pallor, clubbing, lymphadenopathy or edema.  Skin:  no rash or pigmentation.  No purplish striae and She has no alopecia and no hirsutism or acne  EYES:  Externally normal.  Fundii:  normal discs and vessels.  ENT: Oral mucosa and tongue normal.  THYROID:  Not palpable.  HEART:  Normal  S1 and S2; no murmur or click.  CHEST:  Normal shape.  Lungs: Vescicular breath sounds heard equally.  No crepitations/ wheeze.  ABDOMEN:  No distention.  Liver and spleen not palpable.  No other mass or tenderness.  NEUROLOGICAL: .Reflexes are bilaterally absent at ankles, slightly brisk at biceps bilaterally.  JOINTS:  Normal.   ASSESSMENT:    Amenorrhea likely to be related to her taking continuous birth control pills. However would need to rule out hyperprolactinemia and significant hypothyroidism Estradiol status cannot be checked because of her being on birth control pill  She does not clinically have any features suggestive of PCOS such as hirsutism or acne   Low normal TSH: Not clear if there is  any significance to her TSH of 0.33 done in 7/19, will need full thyroid panel  PLAN:   Check prolactin and free T4 and TSH levels Recommend that she be seen by a gynecologist to evaluate optimal birth control pill choice because of her symptoms of spotting She also needs to be evaluated for her fibroids  Follow-up to be determined; if labs are normal no further evaluation is needed and management will be deferred to gynecologist    Consultation note sent to the referring physician  Reather Littler 10/17/2017, 4:38 PM   Addendum: All labs normal, to follow-up as needed  Office Visit on 10/17/2017  Component Date Value Ref Range Status  . TSH 10/17/2017 1.070  0.450 - 4.500 uIU/mL Final  . Free T4 10/17/2017 0.98  0.82 - 1.77 ng/dL Final  . Prolactin 82/95/6213 20.0  4.8 - 23.3 ng/mL Final   Reather Littler 10/19/17

## 2017-10-18 LAB — TSH: TSH: 1.07 u[IU]/mL (ref 0.450–4.500)

## 2017-10-18 LAB — T4, FREE: FREE T4: 0.98 ng/dL (ref 0.82–1.77)

## 2017-10-18 LAB — PROLACTIN: PROLACTIN: 20 ng/mL (ref 4.8–23.3)

## 2017-10-19 ENCOUNTER — Other Ambulatory Visit: Payer: Self-pay | Admitting: Neurology

## 2017-10-19 NOTE — Progress Notes (Signed)
Please call to let patient know that the lab results are normal and recommend follow-up with gynecologist

## 2017-10-20 ENCOUNTER — Telehealth: Payer: Self-pay | Admitting: Neurology

## 2017-10-20 MED ORDER — FENTANYL 25 MCG/HR TD PT72: 25.0000 ug | MEDICATED_PATCH | TRANSDERMAL | 0 refills | Status: DC

## 2017-10-20 NOTE — Telephone Encounter (Signed)
Rx. up front [facility]/fim 

## 2017-10-20 NOTE — Telephone Encounter (Signed)
Rx. awaiting RAS sig/fim 

## 2017-10-20 NOTE — Telephone Encounter (Signed)
Pt requesting refills for fentaNYL (DURAGESIC - DOSED MCG/HR) 25 MCG/HR patch

## 2017-10-29 DIAGNOSIS — M87059 Idiopathic aseptic necrosis of unspecified femur: Secondary | ICD-10-CM | POA: Diagnosis not present

## 2017-10-29 DIAGNOSIS — K5900 Constipation, unspecified: Secondary | ICD-10-CM | POA: Diagnosis not present

## 2017-10-29 DIAGNOSIS — E349 Endocrine disorder, unspecified: Secondary | ICD-10-CM | POA: Diagnosis not present

## 2017-11-04 ENCOUNTER — Telehealth: Payer: Self-pay | Admitting: Neurology

## 2017-11-04 NOTE — Telephone Encounter (Signed)
Pt is asking for a call back from RN Faith to know if PA A. Vanstory (from her PCP's office) has reached out to her.  Pt states she has a lot going on and she wants both her [facility] doctor and her PCP's office to be able to work together re: her best interest.  Please call

## 2017-11-04 NOTE — Telephone Encounter (Signed)
I spoke with Joy Cobb and explained I have not received a call from her pcp's office/fim

## 2017-11-18 ENCOUNTER — Telehealth: Payer: Self-pay | Admitting: Neurology

## 2017-11-18 MED ORDER — FENTANYL 25 MCG/HR TD PT72: 25.0000 ug | MEDICATED_PATCH | TRANSDERMAL | 0 refills | Status: DC

## 2017-11-18 NOTE — Telephone Encounter (Signed)
Patient requesting refill of fentaNYL (DURAGESIC - DOSED MCG/HR) 25 MCG/HR patch.

## 2017-11-18 NOTE — Telephone Encounter (Signed)
Rx. up front [facility]/fim 

## 2017-11-18 NOTE — Telephone Encounter (Signed)
Rx. awaiting RAS sig/fim 

## 2017-11-18 NOTE — Addendum Note (Signed)
Addended by: Candis Schatz I on: 11/18/2017 01:13 PM   Modules accepted: Orders

## 2017-11-19 DIAGNOSIS — Z131 Encounter for screening for diabetes mellitus: Secondary | ICD-10-CM | POA: Diagnosis not present

## 2017-11-19 DIAGNOSIS — G894 Chronic pain syndrome: Secondary | ICD-10-CM | POA: Diagnosis not present

## 2017-11-19 DIAGNOSIS — M87059 Idiopathic aseptic necrosis of unspecified femur: Secondary | ICD-10-CM | POA: Diagnosis not present

## 2017-11-19 DIAGNOSIS — Z0110 Encounter for examination of ears and hearing without abnormal findings: Secondary | ICD-10-CM | POA: Diagnosis not present

## 2017-11-19 DIAGNOSIS — K5900 Constipation, unspecified: Secondary | ICD-10-CM | POA: Diagnosis not present

## 2017-11-19 DIAGNOSIS — Z136 Encounter for screening for cardiovascular disorders: Secondary | ICD-10-CM | POA: Diagnosis not present

## 2017-11-19 DIAGNOSIS — G35 Multiple sclerosis: Secondary | ICD-10-CM | POA: Diagnosis not present

## 2017-12-17 ENCOUNTER — Ambulatory Visit (INDEPENDENT_AMBULATORY_CARE_PROVIDER_SITE_OTHER): Payer: Medicare Other | Admitting: Obstetrics and Gynecology

## 2017-12-17 ENCOUNTER — Encounter: Payer: Self-pay | Admitting: Obstetrics and Gynecology

## 2017-12-17 ENCOUNTER — Other Ambulatory Visit (HOSPITAL_COMMUNITY)
Admission: RE | Admit: 2017-12-17 | Discharge: 2017-12-17 | Disposition: A | Discharge status: Home / Self Care | Payer: Medicare Other | Source: Ambulatory Visit | Attending: Obstetrics and Gynecology | Admitting: Obstetrics and Gynecology

## 2017-12-17 VITALS — BP 154/98 | HR 74 | Ht 65.0 in | Wt 230.8 lb

## 2017-12-17 DIAGNOSIS — R030 Elevated blood-pressure reading, without diagnosis of hypertension: Secondary | ICD-10-CM

## 2017-12-17 DIAGNOSIS — N898 Other specified noninflammatory disorders of vagina: Secondary | ICD-10-CM | POA: Diagnosis not present

## 2017-12-17 NOTE — Progress Notes (Signed)
Pt is here with c/o discharge for 7 months- described as brown, no odor. Pt had MRI on 10/15/17- showed several tiny uterine fibroids

## 2017-12-17 NOTE — Progress Notes (Signed)
GYNECOLOGY OFFICE VISIT NOTE  History:  37 y.o. G0P0000 here today for 7 months of spotting. She reports clots, stringy brown "feces looking stuff" when she voids. Currently taking Yaz, does not ever take breaks to have withdrawal bleeding. She has severe PMDD when she does take breaks so she just takes the pills continuously.   Not currently sexually active, last STI screen was in May 2019. Reports she had a pap in May 2019 that was normal. Not sure what was tested in May but she thinks she was tested for everything. Declines STI testing today.    Past Medical History:  Diagnosis Date  . Bladder spasms   . Colonic inertia   . IBS (irritable bowel syndrome)   . Multiple sclerosis (HCC)   . Neuropathy   . Renal disorder   . Vision abnormalities     Past Surgical History:  Procedure Laterality Date  . BREAST SURGERY    . CYSTECTOMY    . polyp removal       Current Outpatient Medications:  .  baclofen (LIORESAL) 10 MG tablet, TAKE 1 TABLET (10 MG TOTAL) BY MOUTH 3 (THREE) TIMES DAILY., Disp: 90 tablet, Rfl: 9 .  CARAFATE 1 GM/10ML suspension, Reported on 03/14/2015, Disp: , Rfl: 3 .  diazepam (VALIUM) 10 MG tablet, Take 1 tablet (10 mg total) by mouth at bedtime., Disp: 30 tablet, Rfl: 5 .  drospirenone-ethinyl estradiol (YAZ,GIANVI,LORYNA) 3-0.02 MG tablet, Take 1 tablet by mouth daily., Disp: , Rfl:  .  fentaNYL (DURAGESIC - DOSED MCG/HR) 25 MCG/HR patch, Place 1 patch (25 mcg total) onto the skin every 3 (three) days., Disp: 10 patch, Rfl: 0 .  FLUoxetine (PROZAC) 40 MG capsule, , Disp: , Rfl:  .  hydrOXYzine (VISTARIL) 25 MG capsule, TAKE 1 CAPSULE BY MOUTH EVERYDAY AT BEDTIME, Disp: , Rfl: 1 .  linaclotide (LINZESS) 290 MCG CAPS capsule, Take 290 mcg by mouth daily before breakfast., Disp: , Rfl:  .  meloxicam (MOBIC) 15 MG tablet, Take 15 mg by mouth daily as needed. , Disp: , Rfl: 2 .  metoCLOPramide (REGLAN) 5 MG tablet, , Disp: , Rfl:  .  mirtazapine (REMERON) 15 MG  tablet, , Disp: , Rfl:  .  pregabalin (LYRICA) 150 MG capsule, One pill 2 or 3 times a day, Disp: 90 capsule, Rfl: 5 .  TECFIDERA 240 MG CPDR, TAKE 1 CAPSULE (240MG ) BY MOUTH TWICE DAILY, Disp: 180 capsule, Rfl: 0 .  Vitamin D, Ergocalciferol, (DRISDOL) 50000 units CAPS capsule, Take 50,000 Units by mouth 2 (two) times a week. , Disp: , Rfl:   The following portions of the patient's history were reviewed and updated as appropriate: allergies, current medications, past family history, past medical history, past social history, past surgical history and problem list.   Health Maintenance:  Last pap: per patient, done in 06/2017 Last mammogram: [facility] 1 11/2016  Review of Systems:  Pertinent items noted in HPI and remainder of comprehensive ROS otherwise negative.   Objective:  Physical Exam BP (!) 154/98   Pulse 74   Ht 5\' 5"  (1.651 m)   Wt 230 lb 12.8 oz (104.7 kg)   BMI 38.41 kg/m  CONSTITUTIONAL: Well-developed, well-nourished female in no acute distress. Patient extremely nervous and mildly agitated with exam, although cooperative HENT:  Normocephalic, atraumatic. External right and left ear normal. Oropharynx is clear and moist EYES: Conjunctivae and EOM are normal. Pupils are equal, round, and reactive to light. No scleral icterus.  NECK: Normal  range of motion, supple, no masses SKIN: Skin is warm and dry. No rash noted. Not diaphoretic. No erythema. No pallor. NEUROLOGIC: Alert and oriented to person, place, and time. Normal reflexes, muscle tone coordination. No cranial nerve deficit noted. PSYCHIATRIC: Normal mood and affect. Normal behavior. Normal judgment and thought content. CARDIOVASCULAR: Normal heart rate noted RESPIRATORY: Effort normal, no problems with respiration noted ABDOMEN: Soft, no distention noted.   PELVIC: Normal appearing external genitalia; normal appearing vaginal mucosa and cervix.  Small amount brown discharge noted. pelvic cultures obtained. Normal  uterine size, no other palpable masses, no uterine or adnexal tenderness. MUSCULOSKELETAL: Normal range of motion. No edema noted.  Labs and Imaging No results found.  Assessment & Plan:   1. Vaginal discharge - Cervicovaginal ancillary only - benign exam - reviewed likelihood that this is breakthrough bleeding and definitive diagnosis would be to switch to cyclical OCP. Patient adamantly against this as she has severe PMDD when not on OCPs and states her symptoms are horrific. Reviewed that given this is new onset and she has never had issues before, will potentially plan for sampling to rule out malignancy, however this is unlikely given her age. Reviewed that with difficulty with exam, patient would not be good candidate for office EMB, would plan for D&C, hysteroscopy in OR, she and her mother are agreeable if sampling is recommended.   2. Elevated blood pressure reading in office without diagnosis of hypertension - Patient and mother surprised to have elevated reading - Review of notes shows patient has been normotensive at all other office visits   Routine preventative health maintenance measures emphasized. Please refer to After Visit Summary for other counseling recommendations.   No follow-ups on file.  Total face-to-face time with patient: 30 minutes. Over 50% of encounter was spent on counseling and coordination of care.    Baldemar Lenis, M.D. [facility]

## 2017-12-18 ENCOUNTER — Telehealth: Payer: Self-pay | Admitting: Neurology

## 2017-12-18 MED ORDER — FENTANYL 25 MCG/HR TD PT72: 25.0000 ug | MEDICATED_PATCH | TRANSDERMAL | 0 refills | Status: DC

## 2017-12-18 NOTE — Telephone Encounter (Signed)
Pt requesting refills for fentaNYL (DURAGESIC - DOSED MCG/HR) 25 MCG/HR patch aware that Dr. Epimenio Foot is out of the office until tomorrow and the office will close at noon

## 2017-12-18 NOTE — Telephone Encounter (Signed)
Checked drug registry. She last refilled 11/20/17 #10, not receiving from other MD's. Post-dated rx to fill on or after 12-20-17. Last seen 08/13/2017 and next f/u 02/24/17. Rx printed, waiting on Dr. Epimenio Foot signature.

## 2017-12-19 LAB — CERVICOVAGINAL ANCILLARY ONLY
BACTERIAL VAGINITIS: NEGATIVE
CANDIDA VAGINITIS: NEGATIVE
Chlamydia: NEGATIVE
NEISSERIA GONORRHEA: NEGATIVE
TRICH (WINDOWPATH): NEGATIVE

## 2017-12-19 NOTE — Telephone Encounter (Signed)
Rx signed and placed up front for pickup. 

## 2018-01-14 DIAGNOSIS — M87059 Idiopathic aseptic necrosis of unspecified femur: Secondary | ICD-10-CM | POA: Diagnosis not present

## 2018-01-14 DIAGNOSIS — G35 Multiple sclerosis: Secondary | ICD-10-CM | POA: Diagnosis not present

## 2018-01-14 DIAGNOSIS — K5900 Constipation, unspecified: Secondary | ICD-10-CM | POA: Diagnosis not present

## 2018-01-14 DIAGNOSIS — G894 Chronic pain syndrome: Secondary | ICD-10-CM | POA: Diagnosis not present

## 2018-01-19 ENCOUNTER — Telehealth: Payer: Self-pay | Admitting: Neurology

## 2018-01-19 MED ORDER — FENTANYL 25 MCG/HR TD PT72: 25.0000 ug | MEDICATED_PATCH | TRANSDERMAL | 0 refills | Status: DC

## 2018-01-19 NOTE — Telephone Encounter (Signed)
Placed printed/signed rx up front.  

## 2018-01-19 NOTE — Telephone Encounter (Signed)
Checked drug registry. She last refilled 12/20/17 #10. Last seen 08/13/17 and next f/u 02/24/18. Rx printed, waiting on MD signature.

## 2018-01-19 NOTE — Telephone Encounter (Signed)
Pt requesting refills for fentaNYL (DURAGESIC - DOSED MCG/HR) 25 MCG/HR patch, stating she is due to change the patch tonight needing to pick up refills today if possible.

## 2018-02-09 ENCOUNTER — Telehealth: Payer: Self-pay | Admitting: *Deleted

## 2018-02-09 MED ORDER — TECFIDERA 240 MG PO CPDR: DELAYED_RELEASE_CAPSULE | ORAL | 0 refills | Status: DC

## 2018-02-09 NOTE — Telephone Encounter (Signed)
Tecfidera escribed to [facility] in response to faxed request from them. Pt. has pending appt. with Dr. Epimenio Foot this month/fim

## 2018-02-16 ENCOUNTER — Other Ambulatory Visit: Payer: Self-pay | Admitting: Neurology

## 2018-02-16 MED ORDER — FENTANYL 25 MCG/HR TD PT72: 25.0000 ug | MEDICATED_PATCH | TRANSDERMAL | 0 refills | Status: DC

## 2018-02-16 NOTE — Telephone Encounter (Signed)
Pt called needing to pick up a RX for her fentaNYL (DURAGESIC - DOSED MCG/HR) 25 MCG/HR patch

## 2018-02-16 NOTE — Telephone Encounter (Signed)
I called pt to let her know that we know have to escribe prescriptions. Pharmacies will not accept printed rx's. I will send request to Dr. Epimenio Foot. She verbalized understanding.   I checked drug registry. She last refilled 01/20/18 #10, not receiving from other MD's. Last seen 08/13/17 and next f/u 02/24/18. Post dated rx to fill on or after 02/19/2018

## 2018-02-19 DIAGNOSIS — H52223 Regular astigmatism, bilateral: Secondary | ICD-10-CM | POA: Diagnosis not present

## 2018-02-20 ENCOUNTER — Telehealth: Payer: Self-pay | Admitting: *Deleted

## 2018-02-20 MED ORDER — FENTANYL 25 MCG/HR TD PT72: 1.0000 | MEDICATED_PATCH | TRANSDERMAL | 0 refills | Status: DC

## 2018-02-20 NOTE — Telephone Encounter (Signed)
Has questions about the Fentanyl RX.  Having a problem getting it refilled.  Pharmacy told her to call the office.  Please call.

## 2018-02-20 NOTE — Telephone Encounter (Signed)
I called [facility] where rx was escribed by Dr. Epimenio Foot.  I spoke with Hessie Diener who said insurance will only cover 5 patches. I asked him to double check and advised she has been on medication long term and no change in insurance.  He looked further into this and it was not an insurance issue. They did not have enough in stock to fill the rest of the prescription. They will need a new prescription for #5 to fill the rest of her prescription for this month. Advised I will send request to Dr. Epimenio Foot. He verbalized understanding.

## 2018-02-24 ENCOUNTER — Ambulatory Visit: Payer: Medicare Other | Admitting: Neurology

## 2018-03-18 ENCOUNTER — Telehealth: Payer: Self-pay | Admitting: Neurology

## 2018-03-18 NOTE — Telephone Encounter (Signed)
I called pt back. She was last seen 08/13/17 and has no f/u scheduled. She cx her appt 02/24/18 with Dr. Epimenio Foot.  She states she had to pay over 500 dollars for car issues. Advised that d/t medications being controlled she has to f/u at least every 6 months. She expressed dissatisfaction stating Faith, RN never gave her any trouble like this. Advised again that since rx's are controlled she has to f/u regularly. [place] law also requires close monitoring.  I scheduled her appt with Shawnie Dapper, NP tomorrow at 1130am. Tried transferring her to Angie in billing to discuss payment options. She was not available. Advised pt I will leave message for Angie to call her back to discuss further. She verbalized understanding.

## 2018-03-18 NOTE — Telephone Encounter (Signed)
Pt is needing a refill on her fentaNYL (DURAGESIC) 25 MCG/HR she is running out by Sat She also is needing her pregabalin (LYRICA) 150 MG capsule sent to the [facility] on [address].

## 2018-03-18 NOTE — Telephone Encounter (Signed)
Followed up with Angie-pt will make payment tomorrow.

## 2018-03-18 NOTE — Telephone Encounter (Signed)
Angie- Can you please call patient back to discuss payment options? Thank you!

## 2018-03-19 ENCOUNTER — Encounter: Payer: Self-pay | Admitting: Family Medicine

## 2018-03-19 ENCOUNTER — Ambulatory Visit: Payer: Medicare Other | Admitting: Family Medicine

## 2018-03-19 VITALS — BP 122/75 | HR 82 | Ht 65.0 in | Wt 244.4 lb

## 2018-03-19 DIAGNOSIS — G894 Chronic pain syndrome: Secondary | ICD-10-CM | POA: Diagnosis not present

## 2018-03-19 DIAGNOSIS — G35 Multiple sclerosis: Secondary | ICD-10-CM | POA: Diagnosis not present

## 2018-03-19 NOTE — Progress Notes (Signed)
PATIENT: Joy Cobb DOB: 1980/07/01  REASON FOR VISIT: follow up HISTORY FROM: patient  Chief Complaint  Patient presents with  . Follow-up    7 month follow up. Tiffany present. Rm 7. Patient mentioned that she is in more pain than usual. She would like to discuss to increasing her Fentanyl patch from 25 mcg to 37.5 mcg due to her increaesd pain level. She stated that 50 mcg makes her sick to her stomach. She also mentioned that on the 3rd day of switching patches it causes her to go thru withdrawls symptoms and mood swings. She mentioned that she does not want any kind of naracotics.     HISTORY OF PRESENT ILLNESS: Today 03/19/18 Joy Cobb is a 38 y.o. female here today for follow up. She continues  Tecfidera BID and is tolerating well. She feels that she is doing well overall. She does have intermittent numbness and tingling of bilateral hands, feet and left side of face. This has been going on for years and unchanged.   She is having more leg and low back pains over the past year. It is a constant achy pain. It does not radiate. Pain management about 6-7 year ago. Was started on Lyrica 150mg  and Fentanyl 25mg  and feels that this was working up until about a year ago. She feels that it is helping a little but she continues to have breakthrough pain. She is taking Lyrica once daily. She has tried to take more Lyrica but does not feel the increased dose helps. She is changing her patch every 72 hours but after about 30-36 hours later pain returns. Ibuprofen does not help. She has not seen pain management recently.   She denies changes in gait. She has had "a couple of falls." One was after trying to jump up to catch a trash bag and she feel on her knees. She fell face first one time after chasing something (she is vague about this fall.)   She is having trouble sleeping. She can not get to sleep and when she does, she can't stay alseep. She has had a sleep study that was  reportedly normal.   She was started on Remeron about 4 months ago and feels this has really helped with her mood and helps her fall asleep. She does continue to struggle with stress. She is   She is able to control her bladder, no incontinence. She reports frequent urination. She has tried oxybutynin in the past and was not able to urinate. She does not want to treat frequency at this time.     HISTORY: (copied from  note on )  REVIEW OF SYSTEMS: Out of a complete 14 system review of symptoms, the patient complains only of the following symptoms, excessive sweating, heat intolerance, swollen abdomen, constipation, nausea, insomnia, frequent waking, acting out dreams, back pain, achy muscles, itching, memory loss, numbness, agitation, depression, anxiety and all other reviewed systems are negative.  ALLERGIES: Allergies  Allergen Reactions  . Amoxicillin   . Penicillins Hives  . Temazepam     Sharp pains in stomach  . Septra [Sulfamethoxazole-Trimethoprim] Rash    HOME MEDICATIONS: Outpatient Medications Prior to Visit  Medication Sig Dispense Refill  . baclofen (LIORESAL) 10 MG tablet TAKE 1 TABLET (10 MG TOTAL) BY MOUTH 3 (THREE) TIMES DAILY. 90 tablet 9  . CARAFATE 1 GM/10ML suspension Reported on 03/14/2015  3  . drospirenone-ethinyl estradiol (YAZ,GIANVI,LORYNA) 3-0.02 MG tablet Take 1 tablet by mouth daily.    Marland Kitchen  fentaNYL (DURAGESIC) 25 MCG/HR Place 1 patch onto the skin every 3 (three) days. 5 patch 0  . FLUoxetine (PROZAC) 40 MG capsule     . linaclotide (LINZESS) 290 MCG CAPS capsule Take 290 mcg by mouth daily before breakfast.    . meloxicam (MOBIC) 15 MG tablet Take 15 mg by mouth daily as needed (For hip pain flare ups).   2  . metoCLOPramide (REGLAN) 5 MG tablet     . mirtazapine (REMERON) 15 MG tablet     . pregabalin (LYRICA) 150 MG capsule One pill 2 or 3 times a day 90 capsule 5  . TECFIDERA 240 MG CPDR TAKE 1 CAPSULE (240MG ) BY MOUTH TWICE DAILY 180 capsule 0  .  Vitamin D, Ergocalciferol, (DRISDOL) 50000 units CAPS capsule Take 50,000 Units by mouth 2 (two) times a week.     . diazepam (VALIUM) 10 MG tablet Take 1 tablet (10 mg total) by mouth at bedtime. (Patient not taking: Reported on 03/19/2018) 30 tablet 5  . hydrOXYzine (VISTARIL) 25 MG capsule TAKE 1 CAPSULE BY MOUTH EVERYDAY AT BEDTIME  1   No facility-administered medications prior to visit.     PAST MEDICAL HISTORY: Past Medical History:  Diagnosis Date  . Bladder spasms   . Colonic inertia   . IBS (irritable bowel syndrome)   . Multiple sclerosis (HCC)   . Neuropathy   . Renal disorder   . Vision abnormalities     PAST SURGICAL HISTORY: Past Surgical History:  Procedure Laterality Date  . BREAST SURGERY    . CYSTECTOMY    . polyp removal      FAMILY HISTORY: Family History  Problem Relation Age of Onset  . Neuropathy Mother   . Cancer - Other Father   . Diabetes Maternal Grandmother   . Hypertension Maternal Grandmother   . Kidney failure Maternal Grandmother   . Parkinson's disease Maternal Grandmother   . Thyroid disease Maternal Aunt   . Breast cancer Maternal Aunt   . Parkinson's disease Maternal Aunt     SOCIAL HISTORY: Social History   Socioeconomic History  . Marital status: Single    Spouse name: Not on file  . Number of children: Not on file  . Years of education: Not on file  . Highest education level: Not on file  Occupational History  . Not on file  Social Needs  . Financial resource strain: Not on file  . Food insecurity:    Worry: Not on file    Inability: Not on file  . Transportation needs:    Medical: Not on file    Non-medical: Not on file  Tobacco Use  . Smoking status: Never Smoker  . Smokeless tobacco: Never Used  Substance and Sexual Activity  . Alcohol use: Yes  . Drug use: No  . Sexual activity: Not Currently  Lifestyle  . Physical activity:    Days per week: Not on file    Minutes per session: Not on file  . Stress: Not  on file  Relationships  . Social connections:    Talks on phone: Not on file    Gets together: Not on file    Attends religious service: Not on file    Active member of club or organization: Not on file    Attends meetings of clubs or organizations: Not on file    Relationship status: Not on file  . Intimate partner violence:    Fear of current or ex partner: Not on  file    Emotionally abused: Not on file    Physically abused: Not on file    Forced sexual activity: Not on file  Other Topics Concern  . Not on file  Social History Narrative  . Not on file      PHYSICAL EXAM  Vitals:   03/19/18 1117  BP: 122/75  Pulse: 82  Weight: 244 lb 6.4 oz (110.9 kg)  Height: 5\' 5"  (1.651 m)   Body mass index is 40.67 kg/m.  Generalized: Well developed, in no acute distress  Cardiology: normal rate and rhythm, no murmur noted Neurological examination  Mentation: Alert oriented to time, place, history taking. Follows all commands speech and language fluent Cranial nerve II-XII: Pupils were equal round reactive to light. Extraocular movements were full, visual field were full on confrontational test. Facial sensation and strength were normal. Uvula tongue midline. Head turning and shoulder shrug  were normal and symmetric. Motor: The motor testing reveals 5 over 5 strength of all 4 extremities. Good symmetric motor tone is noted throughout.  Sensory: Sensory testing is intact to soft touch on all 4 extremities. No evidence of extinction is noted.  Coordination: Cerebellar testing reveals good finger-nose-finger and heel-to-shin bilaterally.  Gait and station: Gait is normal. Tandem gait is normal. Romberg is negative. No drift is seen.  Reflexes: Deep tendon reflexes are symmetric and normal bilaterally.   DIAGNOSTIC DATA (LABS, IMAGING, TESTING) - I reviewed patient records, labs, notes, testing and imaging myself where available.  No flowsheet data found.   Lab Results  Component  Value Date   WBC 5.1 08/13/2017   HGB 11.1 08/13/2017   HCT 32.4 (L) 08/13/2017   MCV 86 08/13/2017   PLT 208 08/13/2017      Component Value Date/Time   NA 136 02/14/2012 2345   K 3.7 02/14/2012 2345   CL 99 02/14/2012 2345   CO2 29 02/14/2012 2345   GLUCOSE 98 02/14/2012 2345   BUN 13 02/14/2012 2345   CREATININE 0.76 02/14/2012 2345   CALCIUM 9.2 02/14/2012 2345   PROT 7.1 03/14/2015 1631   ALBUMIN 4.2 03/14/2015 1631   AST 13 03/14/2015 1631   ALT 11 03/14/2015 1631   ALKPHOS 74 03/14/2015 1631   BILITOT <0.2 03/14/2015 1631   GFRNONAA >90 02/14/2012 2345   GFRAA >90 02/14/2012 2345   No results found for: CHOL, HDL, LDLCALC, LDLDIRECT, TRIG, CHOLHDL No results found for: ZOXW9UHGBA1C No results found for: VITAMINB12 Lab Results  Component Value Date   TSH 1.070 10/17/2017    ASSESSMENT AND PLAN 38 y.o. year old female  has a past medical history of Bladder spasms, Colonic inertia, IBS (irritable bowel syndrome), Multiple sclerosis (HCC), Neuropathy, Renal disorder, and Vision abnormalities. here with     ICD-10-CM   1. Multiple sclerosis (HCC) G35 CBC with Differential/Platelets    Overall Mrs. Charm BargesButler is doing well and MS seems to be stable.  We will continue current treatment plan.  I will update CBC today.  I have had a discussion with Dr. Epimenio FootSater regarding her concerns of increased pain.  I feel that Mrs. Charm BargesButler would be better served with a pain management specialist.  We do not feel comfortable increasing her fentanyl patches at this time.  I have suggested that she continue to optimize the Lyrica dose as well.  Referral to pain management has been placed today.  I have also suggested that she reach out to psychiatry regarding her Remeron dose.  She does report significant  improvement in her mood as well as her sleep with this medication however she continues to have trouble staying asleep.  I do not feel that the addition of sleep aids would be beneficial at this time.   Optimizing her anxiety treatment plan with psychiatry is recommended.  She has an updated MRI that was stable in 2019.  We will follow-up in 6 months.   Orders Placed This Encounter  Procedures  . CBC with Differential/Platelets     No orders of the defined types were placed in this encounter.     I spent 15 minutes with the patient. 50% of this time was spent counseling and educating patient on plan of care and medications.    Shawnie Dapper, FNP-C 03/19/2018, 2:37 PM [facility] [address] [place], [place] [address] [phone]

## 2018-03-19 NOTE — Patient Instructions (Signed)
Continue current treatment.  Will check CBC today Will discuss change in pain management with Dr Epimenio Foot and update plan as appropriate.   Multiple Sclerosis Multiple sclerosis (MS) is a disease of the brain, spinal cord, and optic nerves (central nervous system). It causes the body's disease-fighting (immune) system to destroy the protective covering (myelin sheath) around nerves in the brain. When this happens, signals (nerve impulses) going to and from the brain and spinal cord do not get sent properly or may not get sent at all. There are several types of MS:  Relapsing-remitting MS. This is the most common type. This causes sudden attacks of symptoms. After an attack, you may recover completely until the next attack, or some symptoms may remain permanently.  Secondary progressive MS. This usually develops after the onset of relapsing-remitting MS. Similar to relapsing-remitting MS, this type also causes sudden attacks of symptoms. Attacks may be less frequent, but symptoms slowly get worse (progress) over time.  Primary progressive MS. This causes symptoms that steadily progress over time. This type of MS does not cause sudden attacks of symptoms. The age of onset of MS varies, but it often develops between 71-76 years of age. MS is a lifelong (chronic) condition. There is no cure, but treatment can help slow down the progression of the disease. What are the causes? The cause of this condition is not known. What increases the risk? You are more likely to develop this condition if:  You are a woman.  You have a relative with MS. However, the condition is not passed from parent to child (inherited).  You have a lack (deficiency) of vitamin D.  You smoke. MS is more common in the [place] than in the [place]. What are the signs or symptoms? Relapsing-remitting and secondary progressive MS cause symptoms to occur in episodes or attacks that may last weeks to  months. There may be long periods between attacks in which there are almost no symptoms. Primary progressive MS causes symptoms to steadily progress after they develop. Symptoms of MS vary because of the many different ways it affects the central nervous system. The main symptoms include:  Vision problems and eye pain.  Numbness.  Weakness.  Inability to move your arms, hands, feet, or legs (paralysis).  Balance problems.  Shaking that you cannot control (tremors).  Muscle spasms.  Problems with thinking (cognitive changes). MS can also cause symptoms that are associated with the disease, but are not always the direct result of an MS attack. They may include:  Inability to control urination or bowel movements (incontinence).  Headaches.  Fatigue.  Inability to tolerate heat.  Emotional changes.  Depression.  Pain. How is this diagnosed? This condition is diagnosed based on:  Your symptoms.  A neurological exam. This involves checking central nervous system function, such as nerve function, reflexes, and coordination.  MRIs of the brain and spinal cord.  Lab tests, including a lumbar puncture that tests the fluid that surrounds the brain and spinal cord (cerebrospinal fluid).  Tests to measure the electrical activity of the brain in response to stimulation (evoked potentials). How is this treated? There is no cure for MS, but medicines can help decrease the number and frequency of attacks and help relieve nuisance symptoms. Treatment options may include:  Medicines that reduce the frequency of attacks. These medicines may be given by injection, by mouth (orally), or through an IV.  Medicines that reduce inflammation (steroids). These may provide short-term relief of  symptoms.  Medicines to help control pain, depression, fatigue, or incontinence.  Vitamin D, if you have a deficiency.  Using devices to help you move around (assistive devices), such as braces, a  cane, or a walker.  Physical therapy to strengthen and stretch your muscles.  Occupational therapy to help you with everyday tasks.  Alternative or complementary treatments such as exercise, massage, or acupuncture. Follow these instructions at home:  Take over-the-counter and prescription medicines only as told by your health care provider.  Do not drive or use heavy machinery while taking prescription pain medicine.  Use assistive devices as recommended by your physical therapist or your health care provider.  Exercise as directed by your health care provider.  Return to your normal activities as told by your health care provider. Ask your health care provider what activities are safe for you.  Reach out for support. Share your feelings with friends, family, or a support group.  Keep all follow-up visits as told by your health care provider and therapists. This is important. Where to find more information  National Multiple Sclerosis Society: https://www.nationalmssociety.org Contact a health care provider if:  You feel depressed.  You develop new pain or numbness.  You have tremors.  You have problems with sexual function. Get help right away if:  You develop paralysis.  You develop numbness.  You have problems with your bladder or bowel function.  You develop double vision.  You lose vision in one or both eyes.  You develop suicidal thoughts.  You develop severe confusion. If you ever feel like you may hurt yourself or others, or have thoughts about taking your own life, get help right away. You can go to your nearest emergency department or call:  Your local emergency services (911 in the [place]  A suicide crisis helpline, such as the National Suicide Prevention Lifeline at (704) 729-9130. This is open 24 hours a day. Summary  Multiple sclerosis (MS) is a disease of the central nervous system that causes the body's immune system to destroy the protective  covering (myelin sheath) around nerves in the brain.  There are 3 types of MS: relapsing-remitting, secondary progressive, and primary progressive. Relapsing-remitting and secondary progressive MS cause symptoms to occur in episodes or attacks that may last weeks to months. Primary progressive MS causes symptoms to steadily progress after they develop.  There is no cure for MS, but medicines can help decrease the number and frequency of attacks and help relieve nuisance symptoms. Treatment may also include physical or occupational therapy.  If you develop numbness, paralysis, vision problems, or other neurological symptoms, get help right away. This information is not intended to replace advice given to you by your health care provider. Make sure you discuss any questions you have with your health care provider. Document Released: 01/19/2000 Document Revised: 04/01/2016 Document Reviewed: 04/01/2016 Elsevier Interactive Patient Education  2019 [employer].

## 2018-03-20 ENCOUNTER — Other Ambulatory Visit: Payer: Self-pay | Admitting: Family Medicine

## 2018-03-20 LAB — CBC WITH DIFFERENTIAL/PLATELET
BASOS: 0 %
Basophils Absolute: 0 10*3/uL (ref 0.0–0.2)
EOS (ABSOLUTE): 0.1 10*3/uL (ref 0.0–0.4)
Eos: 1 %
Hematocrit: 33.8 % — ABNORMAL LOW (ref 34.0–46.6)
Hemoglobin: 11.5 g/dL (ref 11.1–15.9)
IMMATURE GRANS (ABS): 0 10*3/uL (ref 0.0–0.1)
IMMATURE GRANULOCYTES: 0 %
LYMPHS ABS: 1.9 10*3/uL (ref 0.7–3.1)
LYMPHS: 36 %
MCH: 29.6 pg (ref 26.6–33.0)
MCHC: 34 g/dL (ref 31.5–35.7)
MCV: 87 fL (ref 79–97)
Monocytes Absolute: 0.5 10*3/uL (ref 0.1–0.9)
Monocytes: 9 %
NEUTROS ABS: 2.9 10*3/uL (ref 1.4–7.0)
Neutrophils: 54 %
PLATELETS: 241 10*3/uL (ref 150–450)
RBC: 3.89 x10E6/uL (ref 3.77–5.28)
RDW: 14.7 % (ref 11.7–15.4)
WBC: 5.3 10*3/uL (ref 3.4–10.8)

## 2018-03-20 MED ORDER — FENTANYL 25 MCG/HR TD PT72: 1.0000 | MEDICATED_PATCH | TRANSDERMAL | 0 refills | Status: DC

## 2018-03-20 NOTE — Telephone Encounter (Signed)
Pt is requesting refill for fentaNYL (DURAGESIC) 25 MCG/HR (regular dosing) [facility]. Pt is aware the clinic closes at noon today. She is needing this for tomorrow. Please call to advise

## 2018-03-20 NOTE — Telephone Encounter (Signed)
Spoke with pt. and addressed her concerns of increasing pain, which Dr. Epimenio Foot feels will be best addressed in a true pain mx. center. Discussed that increasing restrictions on pain medications make it difficult for an office that is not a true pain mx. center, to be complaint with new restrictions while still meeting all of the patient's needs. [facility] Dr. Epimenio Foot will refill Fentanyl at current dose until she can be seen by pain mx. Pt. is agreeable with this plan./fim

## 2018-03-23 ENCOUNTER — Telehealth: Payer: Self-pay

## 2018-03-23 NOTE — Telephone Encounter (Signed)
-----   Message from Shawnie Dapper, NP sent at 03/20/2018  8:02 AM EST ----- Labs are normal

## 2018-03-23 NOTE — Telephone Encounter (Signed)
Spoke with the patient about her results and she verbalized understanding. No other questions or concerns at this time.

## 2018-03-31 ENCOUNTER — Other Ambulatory Visit: Payer: Self-pay | Admitting: Neurology

## 2018-04-06 ENCOUNTER — Telehealth: Payer: Self-pay | Admitting: Neurology

## 2018-04-07 NOTE — Telephone Encounter (Signed)
error 

## 2018-04-09 ENCOUNTER — Telehealth: Payer: Self-pay | Admitting: Family Medicine

## 2018-04-09 NOTE — Telephone Encounter (Signed)
I called pt back concerning her sleep and fatigue issues. Pt stated she was at the MS society during the phone call. Pt stated I could discuss Amy NP recommendations.  (1) stated Amy NP recommend melatonin for sleep. Pt stated she took melatonin in the past and it did not work for her. I also stated Amy NP recommend sleep study. Pt stated she had a sleep study with Dr.Sater years ago and it was negative for any sleep disturbances. She will be happy to see him again for the sleep study.  (2) I stated Amy NP does not recommend phentamine long term for fatigue. I stated it has been linked to valvular disorders with long term use. Pt verbalized understanding. I did state she recommend amantadine for her fatigue issues.Pt stated she has never taken it before ,and would like to try it. She wrote down the name and was going to research it when she gets home. Also she has not seen a psychiatrist before.She has some names of some psychiatrist and is looking for one now.

## 2018-04-09 NOTE — Telephone Encounter (Signed)
Pt called requesting to be put back on her phentermine because she has not had energy for quite a while. Also she would like to know what can be done about her sleep issues that she addressed in the past appointment. Please advise.

## 2018-04-09 NOTE — Telephone Encounter (Signed)
Has she tried any other stimulants in the past? Is she continuing to see psychiatry? We could consider treating with a different medication to help with fatigue but I am not a fan of continuing phentermine long term. This medication has been linked to valvular disorders with long term use. See if she has ever taken amantadine? We could try that if she wishes. Regarding the sleep concerns, I am happy to send her for a sleep study. Otherwise I advise melatonin or valerian root OTC per packaging instructions.

## 2018-04-10 ENCOUNTER — Other Ambulatory Visit: Payer: Self-pay | Admitting: Family Medicine

## 2018-04-10 DIAGNOSIS — G4700 Insomnia, unspecified: Secondary | ICD-10-CM

## 2018-04-10 MED ORDER — AMANTADINE HCL 100 MG PO CAPS: 100.0000 mg | ORAL_CAPSULE | Freq: Two times a day (BID) | ORAL | 11 refills | Status: DC

## 2018-04-10 NOTE — Telephone Encounter (Signed)
Please let her know that the amantadine has been called into her pharmacy. As for the sleep referral, I have gone back and reviewed the last sleep study. It was normal in 2016. Does she feel that her sleep has significantly changed since then? I know that her concerns were acting out dreams, restlessness, inability to fall and stay asleep. These things were all present with last evaluation. If nothing has changed since then, I do not feel another sleep study is warranted. Dr Epimenio Foot advised close follow up with psychiatry and Valium at night. At her last visit she reported not taking Valium. When did she last see psychiatry? Did she ask to increase Remeron? She told me this was helping mood and sleep at last visit. That may be the better next step to pursue as I do not feel another sleep study will be beneficial unless symptoms have changed/worsened.

## 2018-04-13 ENCOUNTER — Other Ambulatory Visit: Payer: Self-pay | Admitting: Neurology

## 2018-04-13 NOTE — Telephone Encounter (Signed)
Pt sent my chart message of Amy recommendations.

## 2018-04-15 DIAGNOSIS — K5900 Constipation, unspecified: Secondary | ICD-10-CM | POA: Diagnosis not present

## 2018-04-15 DIAGNOSIS — G894 Chronic pain syndrome: Secondary | ICD-10-CM | POA: Diagnosis not present

## 2018-04-15 DIAGNOSIS — M87059 Idiopathic aseptic necrosis of unspecified femur: Secondary | ICD-10-CM | POA: Diagnosis not present

## 2018-04-15 DIAGNOSIS — G35 Multiple sclerosis: Secondary | ICD-10-CM | POA: Diagnosis not present

## 2018-04-20 ENCOUNTER — Telehealth: Payer: Self-pay | Admitting: Neurology

## 2018-04-20 ENCOUNTER — Other Ambulatory Visit: Payer: Self-pay | Admitting: Family Medicine

## 2018-04-20 MED ORDER — FENTANYL 25 MCG/HR TD PT72: 1.0000 | MEDICATED_PATCH | TRANSDERMAL | 0 refills | Status: DC

## 2018-04-20 NOTE — Progress Notes (Signed)
Last refill 03/20/2018

## 2018-04-20 NOTE — Telephone Encounter (Signed)
Pt is needing a refill on her fentaNYL (DURAGESIC) 25 MCG[facility] Pt is also asking what the update on the Pain Management referral she was to get. Please advise.

## 2018-04-20 NOTE — Telephone Encounter (Signed)
Sent to [facility] NP for refill on fentanyl.

## 2018-04-20 NOTE — Telephone Encounter (Signed)
done

## 2018-05-06 ENCOUNTER — Telehealth: Payer: Self-pay | Admitting: Family Medicine

## 2018-05-06 ENCOUNTER — Other Ambulatory Visit: Payer: Self-pay | Admitting: Family Medicine

## 2018-05-06 DIAGNOSIS — K5900 Constipation, unspecified: Secondary | ICD-10-CM | POA: Diagnosis not present

## 2018-05-06 DIAGNOSIS — Z7689 Persons encountering health services in other specified circumstances: Secondary | ICD-10-CM | POA: Diagnosis not present

## 2018-05-06 DIAGNOSIS — M87059 Idiopathic aseptic necrosis of unspecified femur: Secondary | ICD-10-CM | POA: Diagnosis not present

## 2018-05-06 DIAGNOSIS — G35 Multiple sclerosis: Secondary | ICD-10-CM | POA: Diagnosis not present

## 2018-05-06 DIAGNOSIS — G894 Chronic pain syndrome: Secondary | ICD-10-CM | POA: Diagnosis not present

## 2018-05-06 MED ORDER — PREGABALIN 150 MG PO CAPS: ORAL_CAPSULE | ORAL | 0 refills | Status: DC

## 2018-05-06 NOTE — Telephone Encounter (Signed)
Pt request refill for pregabalin (LYRICA) 150 MG capsule sent to [facility]

## 2018-05-06 NOTE — Telephone Encounter (Signed)
Sent to pharmacy 

## 2018-05-20 ENCOUNTER — Other Ambulatory Visit: Payer: Self-pay | Admitting: Family Medicine

## 2018-05-20 MED ORDER — FENTANYL 25 MCG/HR TD PT72: 1.0000 | MEDICATED_PATCH | TRANSDERMAL | 0 refills | Status: DC

## 2018-05-20 NOTE — Telephone Encounter (Signed)
[place] Database Verified LR: 04/20/2018 Qty: 10 Pending appointment: No pending appointment

## 2018-05-20 NOTE — Telephone Encounter (Signed)
Pt called in and stated she needs a refill for fentaNYL (DURAGESIC) 25 MCG/HR to [id number] [place], [place] - [address]

## 2018-06-09 DIAGNOSIS — K219 Gastro-esophageal reflux disease without esophagitis: Secondary | ICD-10-CM | POA: Diagnosis not present

## 2018-06-09 DIAGNOSIS — K5909 Other constipation: Secondary | ICD-10-CM | POA: Diagnosis not present

## 2018-06-09 DIAGNOSIS — R112 Nausea with vomiting, unspecified: Secondary | ICD-10-CM | POA: Diagnosis not present

## 2018-06-09 DIAGNOSIS — K3184 Gastroparesis: Secondary | ICD-10-CM | POA: Diagnosis not present

## 2018-06-10 DIAGNOSIS — G894 Chronic pain syndrome: Secondary | ICD-10-CM | POA: Diagnosis not present

## 2018-06-10 DIAGNOSIS — K5900 Constipation, unspecified: Secondary | ICD-10-CM | POA: Diagnosis not present

## 2018-06-10 DIAGNOSIS — G35 Multiple sclerosis: Secondary | ICD-10-CM | POA: Diagnosis not present

## 2018-06-10 DIAGNOSIS — M87059 Idiopathic aseptic necrosis of unspecified femur: Secondary | ICD-10-CM | POA: Diagnosis not present

## 2018-06-18 ENCOUNTER — Other Ambulatory Visit: Payer: Self-pay | Admitting: Family Medicine

## 2018-06-18 DIAGNOSIS — R112 Nausea with vomiting, unspecified: Secondary | ICD-10-CM | POA: Diagnosis not present

## 2018-06-18 MED ORDER — FENTANYL 25 MCG/HR TD PT72: 1.0000 | MEDICATED_PATCH | TRANSDERMAL | 0 refills | Status: DC

## 2018-06-18 NOTE — Addendum Note (Signed)
Addended by: Manuela Schwartz on: 06/18/2018 10:45 AM   Modules accepted: Orders

## 2018-06-18 NOTE — Telephone Encounter (Signed)
Pt has called for a refill on fentaNYL (DURAGESIC) 25 MCG/HR  [id number]

## 2018-06-18 NOTE — Telephone Encounter (Signed)
[place] Database Verified LR: 05/20/2018 Qty: 10 Pending appointment: No pending appt

## 2018-06-19 DIAGNOSIS — Z01818 Encounter for other preprocedural examination: Secondary | ICD-10-CM | POA: Diagnosis not present

## 2018-06-19 DIAGNOSIS — R112 Nausea with vomiting, unspecified: Secondary | ICD-10-CM | POA: Diagnosis not present

## 2018-06-19 DIAGNOSIS — K210 Gastro-esophageal reflux disease with esophagitis: Secondary | ICD-10-CM | POA: Diagnosis not present

## 2018-06-30 ENCOUNTER — Other Ambulatory Visit: Payer: Self-pay | Admitting: Neurology

## 2018-07-13 DIAGNOSIS — G35 Multiple sclerosis: Secondary | ICD-10-CM | POA: Diagnosis not present

## 2018-07-13 DIAGNOSIS — M87059 Idiopathic aseptic necrosis of unspecified femur: Secondary | ICD-10-CM | POA: Diagnosis not present

## 2018-07-13 DIAGNOSIS — K5900 Constipation, unspecified: Secondary | ICD-10-CM | POA: Diagnosis not present

## 2018-07-13 DIAGNOSIS — G894 Chronic pain syndrome: Secondary | ICD-10-CM | POA: Diagnosis not present

## 2018-07-16 DIAGNOSIS — K3184 Gastroparesis: Secondary | ICD-10-CM | POA: Diagnosis not present

## 2018-07-16 DIAGNOSIS — K5909 Other constipation: Secondary | ICD-10-CM | POA: Diagnosis not present

## 2018-07-16 DIAGNOSIS — K2270 Barrett's esophagus without dysplasia: Secondary | ICD-10-CM | POA: Diagnosis not present

## 2018-07-17 ENCOUNTER — Other Ambulatory Visit: Payer: Self-pay | Admitting: Family Medicine

## 2018-07-17 NOTE — Telephone Encounter (Signed)
Pt has called for a refill on her  fentaNYL ([place]) 25 MCG/HR  [id number] Pt aware the office is not open today and to check with pharmacy on Monday

## 2018-07-20 MED ORDER — FENTANYL 25 MCG/HR TD PT72: 1.0000 | MEDICATED_PATCH | TRANSDERMAL | 0 refills | Status: DC

## 2018-07-20 NOTE — Telephone Encounter (Signed)
Drug Registry checked.  Last fill fentanyl 38mcg 06-18-18 #10.

## 2018-07-20 NOTE — Addendum Note (Signed)
Addended by: Brandon Melnick on: 07/20/2018 09:10 AM   Modules accepted: Orders

## 2018-07-22 ENCOUNTER — Ambulatory Visit: Payer: Medicare Other | Admitting: Allergy

## 2018-07-22 ENCOUNTER — Other Ambulatory Visit: Payer: Self-pay

## 2018-07-22 ENCOUNTER — Encounter: Payer: Self-pay | Admitting: Allergy

## 2018-07-22 VITALS — BP 120/70 | HR 84 | Temp 98.3°F | Resp 20 | Ht 65.2 in | Wt 270.0 lb

## 2018-07-22 DIAGNOSIS — J3089 Other allergic rhinitis: Secondary | ICD-10-CM | POA: Diagnosis not present

## 2018-07-22 DIAGNOSIS — J4520 Mild intermittent asthma, uncomplicated: Secondary | ICD-10-CM | POA: Diagnosis not present

## 2018-07-22 DIAGNOSIS — R0602 Shortness of breath: Secondary | ICD-10-CM | POA: Diagnosis not present

## 2018-07-22 MED ORDER — AZELASTINE HCL 0.15 % NA SOLN: NASAL | 5 refills | Status: DC

## 2018-07-22 MED ORDER — ALBUTEROL SULFATE HFA 108 (90 BASE) MCG/ACT IN AERS: 2.0000 | INHALATION_SPRAY | Freq: Four times a day (QID) | RESPIRATORY_TRACT | 2 refills | Status: DC | PRN

## 2018-07-22 NOTE — Assessment & Plan Note (Signed)
Rhinitis symptoms for the past 6 month mainly at night. Currently on Flonase with good benefit. Unsure if Singulair is helping. Triggers include smoke and weather changes.  Today's skin testing showed: Positive to grass, weed, ragweed, trees, dust mites.   Start environmental control measures.   Continue Flonase 2 sprays daily.  Start azelastine 1-2 sprays 1-2 times a day as needed for drainage.  Nasal saline spray (i.e., Simply Saline) or nasal saline lavage (i.e., NeilMed) is recommended as needed and prior to medicated nasal sprays.  Continue Singulair 10mg  daily.  May use over the counter antihistamines such as Zyrtec (cetirizine), Claritin (loratadine), Allegra (fexofenadine), or Xyzal (levocetirizine) daily as needed.  If above regimen does not control symptoms then consider allergy immunotherapy and/or ENT referral as well.

## 2018-07-22 NOTE — Patient Instructions (Addendum)
Today's skin testing showed: Positive to grass, weed, ragweed, trees, dust mites.    Start environmental control measures.   Continue Flonase 2 sprays daily.  Start azelastine 1-2 sprays 1-2 times a day as needed for drainage.  Nasal saline spray (i.e., Simply Saline) or nasal saline lavage (i.e., NeilMed) is recommended as needed and prior to medicated nasal sprays.  Continue Singulair 10mg  daily.  May use over the counter antihistamines such as Zyrtec (cetirizine), Claritin (loratadine), Allegra (fexofenadine), or Xyzal (levocetirizine) daily as needed.  Breathing:  May use albuterol rescue inhaler 2 puffs or nebulizer every 4 to 6 hours as needed for shortness of breath, chest tightness, coughing, and wheezing. May use albuterol rescue inhaler 2 puffs 5 to 15 minutes prior to strenuous physical activities. Monitor frequency of use.   Follow up in 4 weeks  Reducing Pollen Exposure . Pollen seasons: trees (spring), grass (summer) and ragweed/weeds (fall). Marland Kitchen Keep windows closed in your home and car to lower pollen exposure.  Susa Simmonds air conditioning in the bedroom and throughout the house if possible.  . Avoid going out in dry windy days - especially early morning. . Pollen counts are highest between 5 - 10 AM and on dry, hot and windy days.  . Save outside activities for late afternoon or after a heavy rain, when pollen levels are lower.  . Avoid mowing of grass if you have grass pollen allergy. Marland Kitchen Be aware that pollen can also be transported indoors on people and pets.  . Dry your clothes in an automatic dryer rather than hanging them outside where they might collect pollen.  . Rinse hair and eyes before bedtime.  Control of House Dust Mite Allergen . Dust mite allergens are a common trigger of allergy and asthma symptoms. While they can be found throughout the house, these microscopic creatures thrive in warm, humid environments such as bedding, upholstered furniture and  carpeting. . Because so much time is spent in the bedroom, it is essential to reduce mite levels there.  . Encase pillows, mattresses, and box springs in special allergen-proof fabric covers or airtight, zippered plastic covers.  . Bedding should be washed weekly in hot water (130 F) and dried in a hot dryer. Allergen-proof covers are available for comforters and pillows that can't be regularly washed.  Wendee Copp the allergy-proof covers every few months. Minimize clutter in the bedroom. Keep pets out of the bedroom.  Marland Kitchen Keep humidity less than 50% by using a dehumidifier or air conditioning. You can buy a humidity measuring device called a hygrometer to monitor this.  . If possible, replace carpets with hardwood, linoleum, or washable area rugs. If that's not possible, vacuum frequently with a vacuum that has a HEPA filter. . Remove all upholstered furniture and non-washable window drapes from the bedroom. . Remove all non-washable stuffed toys from the bedroom.  Wash stuffed toys weekly.

## 2018-07-22 NOTE — Assessment & Plan Note (Signed)
SOB and coughing for the past 6 months. No diagnosis of asthma but has used albuterol in the past during URIs with good benefit.   Today's spirometry showed: possible restrictive disease and 9% improvement in FEV1 post bronchodilator treatment and clinically feeling better.   She may have a component of reactive airway disease. Will do a trial of albuterol prn.   May use albuterol rescue inhaler 2 puffs or nebulizer every 4 to 6 hours as needed for shortness of breath, chest tightness, coughing, and wheezing. May use albuterol rescue inhaler 2 puffs 5 to 15 minutes prior to strenuous physical activities. Monitor frequency of use.

## 2018-07-22 NOTE — Progress Notes (Signed)
New Patient Note  RE: Joy Cobb MRN: [id number] DOB: 01/20/1981 Date of Office Visit: 07/22/2018  Referring provider: Jackie Plumsei-Bonsu, George, MD Primary care provider: Jackie Plumsei-Bonsu, George, MD  Chief Complaint: Nasal Congestion (Since November)  History of Present Illness: I had the pleasure of seeing Joy Cobb for initial evaluation at the [facility] on 07/22/2018. She is a 38 y.o. female, who is referred here by Jackie Plumsei-Bonsu, George, MD for the evaluation of nasal congestion.   Rhinitis: She reports symptoms of nasal congestion which mainly occurs in the evening and PND. Symptoms have been going on for 6 months. Other triggers include exposure to smoke and weather changes. Anosmia: no. Headache: no. She has used Flonase with fair improvement in symptoms. Sinus infections: no. Previous work up includes: none. Takes Singulair with unknown benefit.  Previous ENT evaluation: no. Previous sinus imaging: no. History of nasal polyps: no. History of reflux: Yes and takes Pepcid and Protonix.   Breathing: She reports symptoms of shortness of breath, coughing for 6 months. Current medications include no inhalers. She tried the following inhalers: albuterol in the past. Main triggers are infections and exercise. Smoking exposure: yes smokes CBD inhaler once a day for her MS. History of reflux: yes and on PPI.   Assessment and Plan: Joy Cobb is a 38 y.o. female with: Other allergic rhinitis Rhinitis symptoms for the past 6 month mainly at night. Currently on Flonase with good benefit. Unsure if Singulair is helping. Triggers include smoke and weather changes.  Today's skin testing showed: Positive to grass, weed, ragweed, trees, dust mites.   Start environmental control measures.   Continue Flonase 2 sprays daily.  Start azelastine 1-2 sprays 1-2 times a day as needed for drainage.  Nasal saline spray (i.e., Simply Saline) or nasal saline lavage (i.e., NeilMed) is  recommended as needed and prior to medicated nasal sprays.  Continue Singulair 10mg  daily.  May use over the counter antihistamines such as Zyrtec (cetirizine), Claritin (loratadine), Allegra (fexofenadine), or Xyzal (levocetirizine) daily as needed.  If above regimen does not control symptoms then consider allergy immunotherapy and/or ENT referral as well.   Shortness of breath SOB and coughing for the past 6 months. No diagnosis of asthma but has used albuterol in the past during URIs with good benefit.   Today's spirometry showed: possible restrictive disease and 9% improvement in FEV1 post bronchodilator treatment and clinically feeling better.   She may have a component of reactive airway disease. Will do a trial of albuterol prn.   May use albuterol rescue inhaler 2 puffs or nebulizer every 4 to 6 hours as needed for shortness of breath, chest tightness, coughing, and wheezing. May use albuterol rescue inhaler 2 puffs 5 to 15 minutes prior to strenuous physical activities. Monitor frequency of use.   Return in about 4 weeks (around 08/19/2018).  Meds ordered this encounter  Medications  . Azelastine HCl 0.15 % SOLN    Sig: 1-2 sprays in each nostril 1-2 times a day as needed for drainage.    Dispense:  30 mL    Refill:  5  . albuterol (VENTOLIN HFA) 108 (90 Base) MCG/ACT inhaler    Sig: Inhale 2 puffs into the lungs every 6 (six) hours as needed for wheezing or shortness of breath.    Dispense:  2 each    Refill:  2   Other allergy screening: Asthma: no Rhino conjunctivitis: yes Food allergy: no  Dairy causes some abdominal issues.  Medication  allergy: yes  Penicillin - hives  temazepam - abdominal pain Bactrim - rash  Hymenoptera allergy: no Urticaria: no Eczema:no History of recurrent infections suggestive of immunodeficency: no  Diagnostics: Spirometry:  Tracings reviewed. Her effort: Good reproducible efforts. FVC: 2.40L FEV1 2.10L, 76% predicted FEV1/FVC  ratio: 88% Interpretation: Spirometry consistent with possible restrictive disease and 9% improvement in FEV1 post bronchodilator treatment and clinically feeling better.  Please see scanned spirometry results for details.  Skin Testing: Environmental allergy panel and select foods. Positive test to: grass, weed, ragweed, trees, dust mites. Results discussed with patient/family. Airborne Adult Perc - 07/22/18 1009    Time Antigen Placed  1010    Allergen Manufacturer  Greer    Location  Back    Number of Test  59    Panel 1  Select    1. Control-Buffer 50% Glycerol  Negative    2. Control-Histamine 1 mg/ml  2+    3. Albumin saline  Negative    4. Bahia  3+    5. [place]  3+    6. Johnson  2+    7. Kentucky Blue  Negative    8. Meadow Fescue  Negative    9. Perennial Rye  Negative    10. Sweet Vernal  Negative    11. Timothy  Negative    12. Cocklebur  Negative    13. Burweed Marshelder  Negative    14. Ragweed, short  2+    15. Ragweed, Giant  Negative    16. Plantain,  English  --   +/-   17. Lamb's Quarters  --   +/-   18. Sheep Sorrell  Negative    19. Rough Pigweed  Negative    20. Joy Elder, Rough  Negative    21. Mugwort, Common  2+    22. Ash mix  Negative    23. Birch mix  Negative    24. Beech American  2+    25. Box, Elder  2+    26. Cedar, red  Negative    27. Cottonwood, Eastern  2+    28. Elm mix  Negative    29. Hickory mix  --   +/-   30. Maple mix  Negative    31. Oak, Guinea-Biss[place]astern mix  2+    32. Pecan Pollen  Negative    33. Pine mix  Negative    34. Sycamore Eastern  Negative    35. Walnut, Black Pollen  Negative   +/-   36. Alternaria alternata  Negative    37. Cladosporium Herbarum  Negative    38. Aspergillus mix  Negative    39. Penicillium mix  Negative    40. Bipolaris sorokiniana (Helminthosporium)  Negative    41. Drechslera spicifera (Curvularia)  Negative    42. Mucor plumbeus  Negative    43. Fusarium moniliforme  Negative    44.  Aureobasidium pullulans (pullulara)  Negative    45. Rhizopus oryzae  Negative    46. Botrytis cinera  Negative    47. Epicoccum nigrum  Negative    48. Phoma betae  Negative    49. Candida Albicans  Negative    50. Trichophyton mentagrophytes  Negative    51. Mite, D Farinae  5,000 AU/ml  2+    52. Mite, D Pteronyssinus  5,000 AU/ml  2+    53. Cat Hair 10,000 BAU/ml  Negative    54.  Dog Epithelia  Negative  55. Mixed Feathers  Negative    56. Horse Epithelia  Negative    57. Cockroach, German  Negative    58. Mouse  Negative    59. Tobacco Leaf  Negative     Food Perc - 07/22/18 1009    Time Antigen Placed  1010    Allergen Manufacturer  Lavella Hammock    Location  Back    Number of allergen test  10    Food  Select    1. Peanut  Negative    2. Soybean food  Negative    3. Wheat, whole  Negative    4. Sesame  Negative    5. Milk, cow  Negative    6. Egg White, chicken  Negative    7. Casein  Negative    8. Shellfish mix  Negative    9. Fish mix  Negative    10. Cashew  Negative     Intradermal - 07/22/18 1101    Time Antigen Placed  1045    Allergen Manufacturer  Greer    Location  Arm    Number of Test  8    Control  Negative    Mold 1  Negative    Mold 2  Negative     Mold 3   Negative    Mold 4   Negative    Cat  Negative    Dog  Negative    Cockroach  Negative       Past Medical History: Patient Active Problem List   Diagnosis Date Noted  . Other allergic rhinitis 07/22/2018  . Shortness of breath 07/22/2018  . Chronic hip pain 05/06/2016  . REM behavioral disorder 09/11/2015  . Vitamin D deficiency 06/26/2015  . Hypersomnia 04/14/2014  . Chronic fatigue 03/10/2014  . Ataxic gait 03/10/2014  . Optic neuritis 03/10/2014  . Urinary frequency 03/10/2014  . Dysesthesia 03/10/2014  . Snores 03/10/2014  . Hypersomnia with sleep apnea 03/10/2014  . Obesity 02/17/2012  . Multiple sclerosis ([place]) 02/15/2012  . IBS (irritable bowel syndrome) 02/15/2012  .  Depression 02/15/2012  . GERD (gastroesophageal reflux disease) 02/15/2012   Past Medical History:  Diagnosis Date  . Bladder spasms   . Colonic inertia   . IBS (irritable bowel syndrome)   . Multiple sclerosis ([place])   . Neuropathy   . Renal disorder   . Vision abnormalities    Past Surgical History: Past Surgical History:  Procedure Laterality Date  . BREAST SURGERY    . CYSTECTOMY    . polyp removal     Medication List:  Current Outpatient Medications  Medication Sig Dispense Refill  . baclofen (LIORESAL) 10 MG tablet TAKE 1 TABLET (10 MG TOTAL) BY MOUTH 3 (THREE) TIMES DAILY. 270 tablet 3  . drospirenone-ethinyl estradiol (YAZ,GIANVI,LORYNA) 3-0.02 MG tablet Take 1 tablet by mouth daily.    . famotidine (PEPCID) 40 MG tablet     . fentaNYL (DURAGESIC) 25 MCG/HR Place 1 patch onto the skin every 3 (three) days. 10 patch 0  . FLUoxetine (PROZAC) 40 MG capsule     . linaclotide (LINZESS) 290 MCG CAPS capsule Take 290 mcg by mouth daily before breakfast.    . meloxicam (MOBIC) 15 MG tablet Take 15 mg by mouth daily as needed (For hip pain flare ups).   2  . metoCLOPramide (REGLAN) 5 MG tablet     . mirtazapine (REMERON) 15 MG tablet     . pregabalin (LYRICA) 150 MG capsule TAKE ONE  CAPSULE BY MOUTH TWICE A DAY TO 3 TIMES A DAY 90 capsule 0  . SINGULAIR 10 MG tablet     . sucralfate (CARAFATE) 1 GM/10ML suspension     . TECFIDERA 240 MG CPDR TAKE 1 CAPSULE (240MG ) BY  MOUTH TWICE DAILY 180 capsule 1  . Vitamin D, Ergocalciferol, (DRISDOL) 50000 units CAPS capsule Take 50,000 Units by mouth 2 (two) times a week.     Marland Kitchen. albuterol (VENTOLIN HFA) 108 (90 Base) MCG/ACT inhaler Inhale 2 puffs into the lungs every 6 (six) hours as needed for wheezing or shortness of breath. 2 each 2  . Azelastine HCl 0.15 % SOLN 1-2 sprays in each nostril 1-2 times a day as needed for drainage. 30 mL 5   No current facility-administered medications for this visit.    Allergies: Allergies  Allergen  Reactions  . Amoxicillin   . Penicillins Hives  . Temazepam     Sharp pains in stomach  . Septra [Sulfamethoxazole-Trimethoprim] Rash   Social History: Social History   Socioeconomic History  . Marital status: Single    Spouse name: Not on file  . Number of children: Not on file  . Years of education: Not on file  . Highest education level: Not on file  Occupational History  . Not on file  Social Needs  . Financial resource strain: Not on file  . Food insecurity    Worry: Not on file    Inability: Not on file  . Transportation needs    Medical: Not on file    Non-medical: Not on file  Tobacco Use  . Smoking status: Never Smoker  . Smokeless tobacco: Never Used  Substance and Sexual Activity  . Alcohol use: Yes  . Drug use: No  . Sexual activity: Not Currently  Lifestyle  . Physical activity    Days per week: Not on file    Minutes per session: Not on file  . Stress: Not on file  Relationships  . Social Musicianconnections    Talks on phone: Not on file    Gets together: Not on file    Attends religious service: Not on file    Active member of club or organization: Not on file    Attends meetings of clubs or organizations: Not on file    Relationship status: Not on file  Other Topics Concern  . Not on file  Social History Narrative  . Not on file   Lives in an apartment. Smoking: smokes CBD once a day which helps with her MS for the past few years.  Occupation: not employed, disabled   Environmental History: Water Damage/mildew in the house: yes under the sink which was apparently remediated.  Carpet in the family room: yes  Carpet in the bedroom: yes Heating: electric Cooling: central Pet: no  Family History: Family History  Problem Relation Age of Onset  . Neuropathy Mother   . Allergic rhinitis Mother   . Urticaria Mother   . Cancer - Other Father   . Diabetes Maternal Grandmother   . Hypertension Maternal Grandmother   . Kidney failure Maternal  Grandmother   . Parkinson's disease Maternal Grandmother   . Thyroid disease Maternal Aunt   . Breast cancer Maternal Aunt   . Parkinson's disease Maternal Aunt   . Asthma Neg Hx   . Eczema Neg Hx     Review of Systems  Constitutional: Negative for appetite change, chills, fever and unexpected weight change.  HENT: Positive for congestion  and postnasal drip. Negative for rhinorrhea.   Eyes: Negative for itching.  Respiratory: Positive for cough and shortness of breath. Negative for chest tightness and wheezing.   Cardiovascular: Negative for chest pain.  Gastrointestinal: Negative for abdominal pain.  Genitourinary: Negative for difficulty urinating.  Skin: Negative for rash.  Allergic/Immunologic: Positive for environmental allergies. Negative for food allergies.  Neurological: Negative for headaches.   Objective: BP 120/70 (BP Location: Right Arm, Patient Position: Sitting, Cuff Size: Large)   Pulse 84   Temp 98.3 F (36.8 C) (Oral)   Resp 20   Ht 5' 5.2" (1.656 m)   Wt 270 lb (122.5 kg)   SpO2 100%   BMI 44.66 kg/m  Body mass index is 44.66 kg/m. Physical Exam  Constitutional: She is oriented to person, place, and time. She appears well-developed and well-nourished.  HENT:  Head: Normocephalic and atraumatic.  Right Ear: External ear normal.  Left Ear: External ear normal.  Nose: Rhinorrhea present.  Mouth/Throat: Oropharynx is clear and moist.  Eyes: Conjunctivae and EOM are normal.  Neck: Neck supple.  Cardiovascular: Normal rate, regular rhythm and normal heart sounds. Exam reveals no gallop and no friction rub.  No murmur heard. Pulmonary/Chest: Effort normal and breath sounds normal. She has no wheezes. She has no rales.  Abdominal: Soft.  Neurological: She is alert and oriented to person, place, and time.  Skin: Skin is warm. No rash noted.  Psychiatric: She has a normal mood and affect. Her behavior is normal.  Nursing note and vitals reviewed.  The plan  was reviewed with the patient/family, and all questions/concerned were addressed.  It was my pleasure to see Lamar today and participate in her care. Please feel free to contact me with any questions or concerns.  Sincerely,  Wyline Mood, DO Allergy & Immunology  [facility] office: [phone] [facility] office: [phone]

## 2018-07-27 ENCOUNTER — Ambulatory Visit (INDEPENDENT_AMBULATORY_CARE_PROVIDER_SITE_OTHER): Payer: Medicare Other | Admitting: Psychology

## 2018-07-27 DIAGNOSIS — F329 Major depressive disorder, single episode, unspecified: Secondary | ICD-10-CM | POA: Diagnosis not present

## 2018-08-05 ENCOUNTER — Ambulatory Visit (INDEPENDENT_AMBULATORY_CARE_PROVIDER_SITE_OTHER): Payer: Medicare Other | Admitting: Psychology

## 2018-08-05 DIAGNOSIS — F329 Major depressive disorder, single episode, unspecified: Secondary | ICD-10-CM | POA: Diagnosis not present

## 2018-08-12 ENCOUNTER — Other Ambulatory Visit: Payer: Self-pay | Admitting: Neurology

## 2018-08-12 ENCOUNTER — Other Ambulatory Visit: Payer: Self-pay

## 2018-08-12 ENCOUNTER — Encounter: Payer: Self-pay | Admitting: Dietician

## 2018-08-12 ENCOUNTER — Encounter: Payer: Medicare Other | Attending: Internal Medicine | Admitting: Dietician

## 2018-08-12 DIAGNOSIS — K219 Gastro-esophageal reflux disease without esophagitis: Secondary | ICD-10-CM | POA: Diagnosis not present

## 2018-08-12 DIAGNOSIS — E669 Obesity, unspecified: Secondary | ICD-10-CM | POA: Insufficient documentation

## 2018-08-12 DIAGNOSIS — Z6841 Body Mass Index (BMI) 40.0 and over, adult: Secondary | ICD-10-CM | POA: Diagnosis not present

## 2018-08-12 DIAGNOSIS — K581 Irritable bowel syndrome with constipation: Secondary | ICD-10-CM | POA: Insufficient documentation

## 2018-08-12 MED ORDER — PREGABALIN 150 MG PO CAPS: ORAL_CAPSULE | ORAL | 0 refills | Status: DC

## 2018-08-12 NOTE — Patient Instructions (Signed)
Common trigger foods (that cause GERD and/or gastroparesis symptoms.) Try not to lay down for 2 to 3 hours after eating.   Peppermint and spearmint  Chocolate  Alcohol  Caffeinated beverages (regular tea, coffee, colas, energy drinks, other caffeinated soft drinks)  Decaffeinated coffee and decaffeinated regular tea (herbal teas, except those with peppermint or spearmint, are  allowed)  Pepper  High-fat foods, including:  Reduced-fat (2%) milk, whole milk, cream, high-fat cheeses, high-fat yogurt, chocolate milk, cocoa  Fried meats, bacon, sausage, pepperoni, salami, bologna, frankfurters/hot dogs  Other fried foods (doughnuts, french toast, french fries, deep-fried vegetables)  Nuts and nut butters  Pastries and other high-fat desserts  More than 8 teaspoons of oil, butter, shortening per day  Any fruits or vegetables that cause symptoms. (These will vary from person to person.)   Try to start establishing a routine: stop eating at least 2 hours before bedtime, and aim to have breakfast at the same time each morning. Remember that eating small meals frequently throughout the day can help overfilling and causing acid reflux.    Use the FODMAP checklist to identify which foods to cut out/ avoid as much as possible. We want to stay away from the High FODMAPs because these sugars usually cause IBS issues.    Look up "chair exercises" to try out.    Maybe use an app to track your food intake and symptoms, such as the following listed below, or write it down paper.   [employer] [place] app

## 2018-08-12 NOTE — Telephone Encounter (Signed)
Spoke to pt.  Preferred VV (made mychart VV 09-22-18 at 1030).  Drug registry checked last fill 05-06-18 #90. Lyrica 150mg  caps.

## 2018-08-12 NOTE — Telephone Encounter (Signed)
Patient called and requested a refill on rx Lyrica. DW

## 2018-08-12 NOTE — Progress Notes (Signed)
Medical Nutrition Therapy  Appt Start Time: 4:10pm End Time: 5:00pm  Primary concerns today: GERD, IBS, weight management  Referral diagnosis: E66.9 Obesity Preferred learning style: no preference indicated Learning readiness: contemplating   NUTRITION ASSESSMENT   Anthropometrics  Weight: 272.5 lbs Height: 65 in BMI: 45.4 kg/m2   Clinical Medical Hx: GERD, IBS, MS, obesity, depression, chronic fatigue, dysesthesia, hypersomnia w/ sleep apnea, vitamin D deficiency, SOB, REM behavioral disorder, chronic hip pain  Medications: see list  24-Hr Dietary Recall First Meal: cereal (or bagel) Snack: fruit Second Meal: black bean veggie burgers (or sandwich on whole wheat bread)  Snack: sweet Third Meal: chicken + rice Snack: ice cream (occasionally)  Beverages: water, juice, ginger ale, lemonade   Food & Nutrition Related Hx Dietary Hx: Pt states she sometimes will go a day or so without eating because her symptoms (from GERD, gastroparesis, and IBS) are bothersome. Avoids milk (drinks almond milk) d/t stomach pain. Dislikes seafood. Red meat is bothersome, and tries to eat meatless 2 days/week. Likes sweets (fruit, juice, ice cream.) Notices pain/symptoms almost all the time after eating. Eats out 1x/week  GI / Other Notable Symptoms: constipation, acid reflux   Physical Activity  Current average weekly physical activity: none (ADLs)   Estimated Energy Needs Calories: 1800 Carbohydrate: 200g Protein: 113g Fat: 60g   NUTRITION DIAGNOSIS  Altered GI function ([place]rock[place]-1.4) related to IBS, gastroparesis, and GERD as evidenced by acid reflux, constipation, bloating, and stomach pain which occur after consumption of certain foods and beverages.    NUTRITION INTERVENTION  Nutrition education (E-1) on the following topics:  . GERD  . IBS / Low FODMAP Diet  . Inflammation  Handouts Provided Include   NCM IBS Nutrition Therapy   FODMAP Checklist   Food & Activity Log sheets    Learning Style & Readiness for Change Teaching method utilized: Visual & Auditory  Demonstrated degree of understanding via: Teach Back  Barriers to learning/adherence to lifestyle change: None Identified   Goals Established by Pt . Establish a routine for consistent eating (frequent, small meals) throughout the day and avoid eating 2 hours before bed.  . Avoid high FODMAPs.  Marland Kitchen Keep a food and symptom log to help identify trigger foods.  . Look up chair exercises to try for physical activity.    MONITORING & EVALUATION Dietary intake, weekly physical activity, and goals PRN.   Next Steps  Patient is to contact [facility] to schedule follow up visit.

## 2018-08-18 ENCOUNTER — Ambulatory Visit (INDEPENDENT_AMBULATORY_CARE_PROVIDER_SITE_OTHER): Payer: Medicare Other | Admitting: Psychology

## 2018-08-18 ENCOUNTER — Other Ambulatory Visit: Payer: Self-pay | Admitting: Family Medicine

## 2018-08-18 DIAGNOSIS — F329 Major depressive disorder, single episode, unspecified: Secondary | ICD-10-CM

## 2018-08-18 MED ORDER — FENTANYL 25 MCG/HR TD PT72: 1.0000 | MEDICATED_PATCH | TRANSDERMAL | 0 refills | Status: DC

## 2018-08-18 NOTE — Telephone Encounter (Signed)
Pt is requesting a refill of fentaNYL (DURAGESIC) 25 MCG/HR , to be sent to [id number] - [place], [place]

## 2018-08-18 NOTE — Telephone Encounter (Signed)
Drug registry checked.  Fentanyl 87mcg/patch.  #10.  Last filled 07-20-18.

## 2018-08-24 ENCOUNTER — Other Ambulatory Visit: Payer: Self-pay | Admitting: Family Medicine

## 2018-08-24 DIAGNOSIS — G8929 Other chronic pain: Secondary | ICD-10-CM

## 2018-08-24 DIAGNOSIS — G35 Multiple sclerosis: Secondary | ICD-10-CM

## 2018-08-25 ENCOUNTER — Ambulatory Visit: Payer: Medicare Other | Admitting: Psychology

## 2018-08-25 ENCOUNTER — Ambulatory Visit (INDEPENDENT_AMBULATORY_CARE_PROVIDER_SITE_OTHER): Payer: Medicare Other | Admitting: Psychology

## 2018-08-25 ENCOUNTER — Other Ambulatory Visit: Payer: Self-pay | Admitting: Neurology

## 2018-08-25 DIAGNOSIS — F329 Major depressive disorder, single episode, unspecified: Secondary | ICD-10-CM

## 2018-09-10 ENCOUNTER — Ambulatory Visit (INDEPENDENT_AMBULATORY_CARE_PROVIDER_SITE_OTHER): Payer: Medicare Other | Admitting: Psychology

## 2018-09-10 DIAGNOSIS — F329 Major depressive disorder, single episode, unspecified: Secondary | ICD-10-CM | POA: Diagnosis not present

## 2018-09-17 ENCOUNTER — Telehealth: Payer: Self-pay | Admitting: Family Medicine

## 2018-09-17 MED ORDER — FENTANYL 25 MCG/HR TD PT72: 1.0000 | MEDICATED_PATCH | TRANSDERMAL | 0 refills | Status: DC

## 2018-09-17 NOTE — Telephone Encounter (Signed)
Pt has called for a refill on her fentaNYL (DURAGESIC) 25 MCG/HR [id number]  

## 2018-09-17 NOTE — Addendum Note (Signed)
Addended by: Jalayiah Bibian A on: 09/17/2018 05:12 PM   Modules accepted: Orders  

## 2018-09-17 NOTE — Telephone Encounter (Signed)
Drug registry checked, fentanyl patches 20mcg  #10 last filled 08-18-18.

## 2018-09-22 ENCOUNTER — Encounter: Payer: Self-pay | Admitting: Family Medicine

## 2018-09-22 ENCOUNTER — Telehealth (INDEPENDENT_AMBULATORY_CARE_PROVIDER_SITE_OTHER): Payer: Medicare Other | Admitting: Family Medicine

## 2018-09-22 DIAGNOSIS — G35 Multiple sclerosis: Secondary | ICD-10-CM

## 2018-09-22 NOTE — Progress Notes (Signed)
I have read the note, and I agree with the clinical assessment and plan.  Artemis Loyal A. Obie Silos, MD, PhD, FAAN Certified in Neurology, Clinical Neurophysiology, Sleep Medicine, Pain Medicine and Neuroimaging  [facility] [address], [address] [place], [place] [address] [phone]  

## 2018-09-22 NOTE — Progress Notes (Signed)
PATIENT: Joy Cobb DOB: 02/19/1980  REASON FOR VISIT: follow up HISTORY FROM: patient  Virtual Visit via Telephone Note  I connected with Joy Cobb on 09/22/18 at 10:30 AM EDT by telephone and verified that I am speaking with the correct person using two identifiers.   I discussed the limitations, risks, security and privacy concerns of performing an evaluation and management service by telephone and the availability of in person appointments. I also discussed with the patient that there may be a patient responsible charge related to this service. The patient expressed understanding and agreed to proceed.   History of Present Illness:  09/22/18 Joy Cobb here today for follow up for MS. She continues Tecfidera daily. Last MR brain was stable in 09/2017.  She feels that she is mostly stable from an MS perspective.  Chronic pain continues but is controlled with fentanyl patch 425mcg/72 hours and Lyrica 150mg  twice daily.  She does take Mobic on rare instances where she has hip pain.  She is also using baclofen 10 mg as needed for muscle spasms.  She was started on Remeron 15 mg daily by her primary care provider for mood.  She reports that this also helps her sleep. She has gained about 30-40 pounds since starting Remeron.  She is planning to follow-up to discuss this medication with her primary care provider.  She does not feel that there is been any other significant changes to cause weight gain. She denies any new numbness or weakness.  No vision changes.  No changes in bowel or bladder habits.  HISTORY: (copied from my note on 03/19/2018)  Joy Cobb is a 38 y.o. Cobb here today for follow up. She continues  Tecfidera BID and is tolerating well. She feels that she is doing well overall. She does have intermittent numbness and tingling of bilateral hands, feet and left side of face. This has been going on for years and unchanged.   She is  having more leg and low back pains over the past year. It is a constant achy pain. It does not radiate. Pain management about 6-7 year ago. Was started on Lyrica 150mg  and Fentanyl 25mg  and feels that this was working up until about a year ago. She feels that it is helping a little but she continues to have breakthrough pain. She is taking Lyrica once daily. She has tried to take more Lyrica but does not feel the increased dose helps. She is changing her patch every 72 hours but after about 30-36 hours later pain returns. Ibuprofen does not help. She has not seen pain management recently.   She denies changes in gait. She has had "a couple of falls." One was after trying to jump up to catch a trash bag and she feel on her knees. She fell face first one time after chasing something (she is vague about this fall.)   She is having trouble sleeping. She can not get to sleep and when she does, she can't stay alseep. She has had a sleep study that was reportedly normal.   She was started on Remeron about 4 months ago and feels this has really helped with her mood and helps her fall asleep. She does continue to struggle with stress. She is   She is able to control her bladder, no incontinence. She reports frequent urination. She has tried oxybutynin in the past and was not able to urinate. She does not want  to treat frequency at this time.    History (copied from Dr Garth Bigness note on 08/13/2017)  She feels her MS is mostly stable on Tecfidera.   She has no exacerbation.     Gait is ok with occasional stumbles.   Strength is ok.   She gets a buzzing sensation in her left hand and numbness in her feet     Bladder is doing ok.    She feels her pain is better on Fentanyl but that it is less helpful the last day of each 3 day patch.    She still feels she has done better on the patch than she did on pills in the past.     She is also on Lyrica  She is sometimes moody but denies worsening depression.   She has  fatigue.     She has sometimes punched or kicked at night while asleep.   We discussed that she might have RBD.   She is on diazepam and has been on it a while so she is not sure how much if any if has helped.   Energy drinks help her fatigue some and she tries not to take later in the day.       Update 12/11/2016: She is on Tecfidera and tolerates it well.     She denies any new exacerbations.  Her gait is off balanced.   She has left leg weakness and spasticity.   Baclofen helps spasticity.    She has has left ankle pain.  Initially she had some swelling in it.   She was told she might have plantar fasciitis and also was found to have a bone spur.    Her PCP advised her to f/u with podiatry.   She did not take prednisone as she had AVN in the past (hips).   She has a lot of pain in her legs.  She is on Lyrica and fentanyl patches (25 mcg).    Last UDS that showed THC as well as the appropriate medications. As she is also on diazepam, I will keep the fentanyl dose of 25 g  She has depression.   She does not feel antidepressants like prozac have helped.  She feels she is doing worse. There is no suicidal ideation. She has been referred to [facility].   She was prescribed hydroxyzine.      Sleep is a problem.   She falls asleep easily with diazepam.   She has active dreams despite the diazepam (which only helped some).     Fatigue is better on phentermine but she did not lose weight so she stopped.      ___________________________________________________ Form 05/06/2016:   Gait/strength/sensation:   She reports mild leg pain and has a lot of hip pain that limits her walking.  She notes tingling sometimes with pain in the feet. Her gait is mildly off but when she is tired her gait is worse. She has mild spasticity in her legs that has been helped by the baclofen. She notes pain  mostly in the legs and back. Pain is achy.   She takes Lyrica and is also on fentanyl for the pain.   Hip pain:   She reports pain in her hips and her back.   She reports her thighs hurt to the touch.     She has avascular necrosis and reports being told she will need replacement.   She had been on multiple courses of IV Solu-Medrol at  the time of diagnosis (up in [place]).      Vision:   She denies any current problems with her eyes and feels that she recovered well from the optic neuritis.    She notes light sensitivity at night   Sleep:    Valium ws started at the last visit for bizarre sleep behavior recently, likely REM behavior disorder. She now talks some in sleep but no yelling or kicking.    Her PSG last year, did not show any parasomnias or OSA.    She has excessive daytime sleepiness.   .  She sleeps 5-6 hrs a night now which is better than last year.     Bladder:  She notes some bladder frequency and urgency. She had been prescribed location in the past but she does not take it on a regular basis because this problem is not as bad as it had been in the past.  Mood/cognitive:   She notes less depression this year than last year.   She sees [employer].   She denies suicide ideation or plan.  Cognition is mildly impaired.  Specifically, she notes impaired short-term memory, attention, and word finding.   She loses items a lot. She'll sometimes read a paragraph and not remember what she had just read.     Fatigue/sleep/weight:  She continues to have a lot of fatigue and excessive sleepiness. She has gained 20 pounds in the past year.    Phentermine helped the fatigue and she noted she gained more weight when she stopped 2 months ago.       MS History:  She was diagnosed with multiple sclerosis in April 2012 after her second episode of optic neuritis. Her previous episode was about 7 or 8 months earlier and an MRI of the brain at that time did not show changes consistent with MS. After her second episode she had another MRI which showed some changes in the brain consistent with  MS. She received IV steroids for the second optic neuritis.  First episode was left and second episode was right.   A lumbar puncture showed the CSF was consistent with Multiple sclerosis.   She was initially placed on Betaseron. However, she continued to have exacerbations. Additionally, she had difficulty tolerating it well. In 2012, she had 3 other exacerbations while on Betaseron. At least some of them involved her spine and she had evidence on MRI and bilateral changes in her legs on exam. Due to the aggressiveness of the MS, she received 8 courses of IV Cytoxan while she was living in [place].  She then was switched to Tecfidera in 2013 and remains on that medication.  She moved to [facility] in 2013 and started to see Dr. Leotis Shames. When he moved further away, she transferred her care to me. In 2014, she had some worsening gait that was felt to be due to an exacerbation that she receive a course of steroids. She has not had any further exacerbations while on Tecfidera.  Her last MRI was early 2015 at [facility].   Observations/Objective:  Generalized: Well developed, in no acute distress  Mentation: Alert oriented to time, place, history taking. Follows all commands speech and language fluent   Assessment and Plan:  38 y.o. year old Cobb  has a past medical history of Bladder spasms, Colonic inertia, IBS (irritable bowel syndrome), Multiple sclerosis (HCC), Neuropathy, Renal disorder, and Vision abnormalities. here with    ICD-10-CM   1. Multiple sclerosis (HCC)  G35  CBC with Differential/Platelets    CMP    Stratify JCV Ab (w/ Index) w/ Rflx   Overall Ms. Charm Cobb is doing well on Tecfidera.  MS appears stable.  We will continue current treatment plan.  She was advised to reach out to her primary care regarding the Remeron again.  I have also suggested she consider asking about healthy weight loss referral.  We will order labs today.  She was instructed to come to our office  between the hours of 8 AM and 11 PM for 1:30 PM to 4:30 PM to obtain labs.  She will follow-up with Dr. Epimenio FootSater in 6 months.  She verbalizes understanding and agreement with this plan.  Orders Placed This Encounter  Procedures  . CBC with Differential/Platelets    Standing Status:   Future    Standing Expiration Date:   09/22/2019  . CMP    Standing Status:   Future    Standing Expiration Date:   09/22/2019  . Stratify JCV Ab (w/ Index) w/ Rflx    Standing Status:   Future    Standing Expiration Date:   09/22/2019    No orders of the defined types were placed in this encounter.    Follow Up Instructions:  I discussed the assessment and treatment plan with the patient. The patient was provided an opportunity to ask questions and all were answered. The patient agreed with the plan and demonstrated an understanding of the instructions.   The patient was advised to call back or seek an in-person evaluation if the symptoms worsen or if the condition fails to improve as anticipated.  I provided 20 minutes of non-face-to-face time during this encounter.  Patient is located at her place of residence during video conference.  Provider is located in the office.   Shawnie DapperAmy Alanya Vukelich, NP

## 2018-09-24 ENCOUNTER — Ambulatory Visit (INDEPENDENT_AMBULATORY_CARE_PROVIDER_SITE_OTHER): Payer: Medicare Other | Admitting: Psychology

## 2018-09-24 DIAGNOSIS — F329 Major depressive disorder, single episode, unspecified: Secondary | ICD-10-CM

## 2018-10-08 ENCOUNTER — Ambulatory Visit: Payer: Medicare Other | Admitting: Psychology

## 2018-10-09 ENCOUNTER — Ambulatory Visit (INDEPENDENT_AMBULATORY_CARE_PROVIDER_SITE_OTHER): Payer: Medicare Other | Admitting: Psychology

## 2018-10-09 DIAGNOSIS — F329 Major depressive disorder, single episode, unspecified: Secondary | ICD-10-CM | POA: Diagnosis not present

## 2018-10-16 ENCOUNTER — Other Ambulatory Visit: Payer: Self-pay | Admitting: Family Medicine

## 2018-10-16 NOTE — Telephone Encounter (Signed)
Pt has called for a refill on her  fentaNYL (DURAGESIC) 25 MCG/HR  pregabalin (LYRICA) 150 MG capsule  [id number]  

## 2018-10-19 ENCOUNTER — Other Ambulatory Visit: Payer: Self-pay | Admitting: Neurology

## 2018-10-19 MED ORDER — PREGABALIN 150 MG PO CAPS: ORAL_CAPSULE | ORAL | 5 refills | Status: DC

## 2018-10-19 MED ORDER — FENTANYL 25 MCG/HR TD PT72: 1.0000 | MEDICATED_PATCH | TRANSDERMAL | 0 refills | Status: DC

## 2018-10-19 NOTE — Telephone Encounter (Signed)
Drug registry checked last fill Fentanyl 40mcg patch 09-17-18 #10,   Lyrica 150mg  last fill 08-12-18 #90 (one tid).

## 2018-10-19 NOTE — Addendum Note (Signed)
Addended by: Brandon Melnick on: 10/19/2018 10:29 AM   Modules accepted: Orders

## 2018-10-19 NOTE — Telephone Encounter (Signed)
Pt is needing a refill on her pregabalin (LYRICA) 150 MG capsule and her fentaNYL (DURAGESIC) 25 MCG/HR [facility] [address]   Pt is living with her mother for the next couple of weeks and she lives an hour away but is in [place] this afternoon and would like to know if she can get it before she heads back to her mother's home. Please advise.

## 2018-10-19 NOTE — Telephone Encounter (Signed)
Called pt to let her know I will send request to MD.  She verbalized understanding.

## 2018-10-22 ENCOUNTER — Ambulatory Visit (INDEPENDENT_AMBULATORY_CARE_PROVIDER_SITE_OTHER): Payer: Medicare Other | Admitting: Psychology

## 2018-10-22 DIAGNOSIS — F329 Major depressive disorder, single episode, unspecified: Secondary | ICD-10-CM | POA: Diagnosis not present

## 2018-11-05 ENCOUNTER — Ambulatory Visit (INDEPENDENT_AMBULATORY_CARE_PROVIDER_SITE_OTHER): Payer: Medicare Other | Admitting: Psychology

## 2018-11-05 DIAGNOSIS — F329 Major depressive disorder, single episode, unspecified: Secondary | ICD-10-CM | POA: Diagnosis not present

## 2018-11-17 ENCOUNTER — Other Ambulatory Visit: Payer: Self-pay | Admitting: Family Medicine

## 2018-11-17 MED ORDER — FENTANYL 25 MCG/HR TD PT72: 1.0000 | MEDICATED_PATCH | TRANSDERMAL | 0 refills | Status: DC

## 2018-11-17 NOTE — Addendum Note (Signed)
Addended by: Britt Bottom on: 11/17/2018 06:11 PM   Modules accepted: Orders

## 2018-11-17 NOTE — Telephone Encounter (Addendum)
Pt has called for a refill on her fentaNYL (DURAGESIC) 25 MCG/HR  Pt states she will need this Thursday  [id number]

## 2018-11-17 NOTE — Addendum Note (Signed)
Addended by: Minna Antis on: 11/17/2018 02:42 PM   Modules accepted: Orders

## 2018-11-19 ENCOUNTER — Ambulatory Visit: Payer: Medicare Other | Admitting: Psychology

## 2018-11-19 DIAGNOSIS — Z0100 Encounter for examination of eyes and vision without abnormal findings: Secondary | ICD-10-CM | POA: Diagnosis not present

## 2018-11-19 DIAGNOSIS — M87059 Idiopathic aseptic necrosis of unspecified femur: Secondary | ICD-10-CM | POA: Diagnosis not present

## 2018-11-19 DIAGNOSIS — G35 Multiple sclerosis: Secondary | ICD-10-CM | POA: Diagnosis not present

## 2018-11-19 DIAGNOSIS — K5900 Constipation, unspecified: Secondary | ICD-10-CM | POA: Diagnosis not present

## 2018-11-19 DIAGNOSIS — Z131 Encounter for screening for diabetes mellitus: Secondary | ICD-10-CM | POA: Diagnosis not present

## 2018-11-19 DIAGNOSIS — G894 Chronic pain syndrome: Secondary | ICD-10-CM | POA: Diagnosis not present

## 2018-12-03 ENCOUNTER — Ambulatory Visit (INDEPENDENT_AMBULATORY_CARE_PROVIDER_SITE_OTHER): Payer: Medicare Other | Admitting: Psychology

## 2018-12-03 DIAGNOSIS — F329 Major depressive disorder, single episode, unspecified: Secondary | ICD-10-CM

## 2018-12-16 ENCOUNTER — Telehealth: Payer: Self-pay | Admitting: *Deleted

## 2018-12-16 ENCOUNTER — Other Ambulatory Visit: Payer: Self-pay | Admitting: Family Medicine

## 2018-12-16 MED ORDER — FENTANYL 25 MCG/HR TD PT72: 1.0000 | MEDICATED_PATCH | TRANSDERMAL | 0 refills | Status: DC

## 2018-12-16 NOTE — Telephone Encounter (Signed)
error 

## 2018-12-16 NOTE — Telephone Encounter (Signed)
Pt has called for a refill on her fentaNYL ([place]) 25 MCG/HR [id number]  Pt states she is temporarily living out of town and is asking if she can pick up the medication on tomorrow.  Pt needs to change the patch on Sunday and states since she is picking up another medication tomorrow can she also get the fentaNYL (DURAGESIC) 25 MCG/HR Then too.  Please call

## 2018-12-16 NOTE — Addendum Note (Signed)
Addended by: Minna Antis on: 12/16/2018 01:46 PM   Modules accepted: Orders

## 2018-12-17 ENCOUNTER — Ambulatory Visit (INDEPENDENT_AMBULATORY_CARE_PROVIDER_SITE_OTHER): Payer: Medicare Other | Admitting: Psychology

## 2018-12-17 DIAGNOSIS — F329 Major depressive disorder, single episode, unspecified: Secondary | ICD-10-CM | POA: Diagnosis not present

## 2018-12-29 DIAGNOSIS — N3000 Acute cystitis without hematuria: Secondary | ICD-10-CM | POA: Diagnosis not present

## 2018-12-29 DIAGNOSIS — M87059 Idiopathic aseptic necrosis of unspecified femur: Secondary | ICD-10-CM | POA: Diagnosis not present

## 2018-12-29 DIAGNOSIS — K5900 Constipation, unspecified: Secondary | ICD-10-CM | POA: Diagnosis not present

## 2018-12-29 DIAGNOSIS — G35 Multiple sclerosis: Secondary | ICD-10-CM | POA: Diagnosis not present

## 2019-01-12 DIAGNOSIS — K5900 Constipation, unspecified: Secondary | ICD-10-CM | POA: Diagnosis not present

## 2019-01-12 DIAGNOSIS — G35 Multiple sclerosis: Secondary | ICD-10-CM | POA: Diagnosis not present

## 2019-01-12 DIAGNOSIS — M87059 Idiopathic aseptic necrosis of unspecified femur: Secondary | ICD-10-CM | POA: Diagnosis not present

## 2019-01-12 DIAGNOSIS — N3000 Acute cystitis without hematuria: Secondary | ICD-10-CM | POA: Diagnosis not present

## 2019-01-14 ENCOUNTER — Ambulatory Visit (INDEPENDENT_AMBULATORY_CARE_PROVIDER_SITE_OTHER): Payer: Medicare Other | Admitting: Psychology

## 2019-01-14 DIAGNOSIS — F329 Major depressive disorder, single episode, unspecified: Secondary | ICD-10-CM | POA: Diagnosis not present

## 2019-01-18 ENCOUNTER — Other Ambulatory Visit: Payer: Self-pay | Admitting: Family Medicine

## 2019-01-18 MED ORDER — FENTANYL 25 MCG/HR TD PT72: 1.0000 | MEDICATED_PATCH | TRANSDERMAL | 0 refills | Status: DC

## 2019-01-18 NOTE — Telephone Encounter (Addendum)
1) Medication(s) Requested (by name): fentaNYL ([place]) 25 MCG/HR  2) Pharmacy of Choice:  [address] [place] [address]

## 2019-01-18 NOTE — Telephone Encounter (Signed)
Drug registry checked. Last filled fentanyl 42mcg patch apply every 3 days. Last fill 12-16-18 #10.

## 2019-01-21 ENCOUNTER — Other Ambulatory Visit: Payer: Self-pay | Admitting: Allergy

## 2019-01-21 NOTE — Telephone Encounter (Signed)
Courtesy refill only. No more refills until pt. Has an office visit.

## 2019-01-28 ENCOUNTER — Ambulatory Visit: Payer: Medicare Other | Admitting: Psychology

## 2019-02-11 ENCOUNTER — Ambulatory Visit (INDEPENDENT_AMBULATORY_CARE_PROVIDER_SITE_OTHER): Payer: Medicare Other | Admitting: Psychology

## 2019-02-11 DIAGNOSIS — F329 Major depressive disorder, single episode, unspecified: Secondary | ICD-10-CM

## 2019-02-16 ENCOUNTER — Other Ambulatory Visit: Payer: Self-pay | Admitting: Family Medicine

## 2019-02-16 MED ORDER — FENTANYL 25 MCG/HR TD PT72: 1.0000 | MEDICATED_PATCH | TRANSDERMAL | 0 refills | Status: DC

## 2019-02-16 NOTE — Telephone Encounter (Signed)
Pt has called for a refill on her  pregabalin (LYRICA) 150 MG capsule  [id number]  

## 2019-02-16 NOTE — Addendum Note (Signed)
Addended by: Guy Begin on: 02/16/2019 02:59 PM   Modules accepted: Orders

## 2019-02-16 NOTE — Telephone Encounter (Signed)
Drug registry checked, Fentanyl last filled 01-18-19.

## 2019-02-25 ENCOUNTER — Ambulatory Visit (INDEPENDENT_AMBULATORY_CARE_PROVIDER_SITE_OTHER): Payer: Medicare Other | Admitting: Psychology

## 2019-02-25 DIAGNOSIS — F329 Major depressive disorder, single episode, unspecified: Secondary | ICD-10-CM

## 2019-03-17 ENCOUNTER — Other Ambulatory Visit: Payer: Self-pay | Admitting: Family Medicine

## 2019-03-17 MED ORDER — PREGABALIN 150 MG PO CAPS: ORAL_CAPSULE | ORAL | 5 refills | Status: DC

## 2019-03-17 MED ORDER — FENTANYL 25 MCG/HR TD PT72: 1.0000 | MEDICATED_PATCH | TRANSDERMAL | 0 refills | Status: DC

## 2019-03-17 NOTE — Addendum Note (Signed)
Addended by: [place] Pink on: 03/17/2019 03:13 PM   Modules accepted: Orders

## 2019-03-17 NOTE — Telephone Encounter (Signed)
Pt has called for a refill on her  fentaNYL (DURAGESIC) 25 MCG/HR  pregabalin (LYRICA) 150 MG capsule  [id number]

## 2019-03-29 ENCOUNTER — Other Ambulatory Visit: Payer: Self-pay

## 2019-03-29 ENCOUNTER — Encounter: Payer: Self-pay | Admitting: Neurology

## 2019-03-29 ENCOUNTER — Ambulatory Visit: Payer: Medicare Other | Admitting: Neurology

## 2019-03-29 VITALS — BP 147/91 | HR 75 | Temp 97.4°F | Ht 65.0 in | Wt 258.5 lb

## 2019-03-29 DIAGNOSIS — M25551 Pain in right hip: Secondary | ICD-10-CM | POA: Diagnosis not present

## 2019-03-29 DIAGNOSIS — Z79899 Other long term (current) drug therapy: Secondary | ICD-10-CM

## 2019-03-29 DIAGNOSIS — G35 Multiple sclerosis: Secondary | ICD-10-CM

## 2019-03-29 DIAGNOSIS — Z79891 Long term (current) use of opiate analgesic: Secondary | ICD-10-CM | POA: Diagnosis not present

## 2019-03-29 DIAGNOSIS — M25552 Pain in left hip: Secondary | ICD-10-CM | POA: Diagnosis not present

## 2019-03-29 DIAGNOSIS — G8929 Other chronic pain: Secondary | ICD-10-CM

## 2019-03-29 DIAGNOSIS — G4752 REM sleep behavior disorder: Secondary | ICD-10-CM

## 2019-03-29 MED ORDER — PHENTERMINE HCL 37.5 MG PO CAPS: 37.5000 mg | ORAL_CAPSULE | ORAL | 5 refills | Status: DC

## 2019-03-29 NOTE — Progress Notes (Signed)
[facility]  PATIENT: Joy Cobb DOB: March 10, 1980  REFERRING CLINICIAN: Greggory Stallion Osei-Bonsu  HISTORY FROM: [name] REASON FOR VISIT: MS and related symptoms   HISTORICAL  CHIEF COMPLAINT:  Chief Complaint  Patient presents with  . Follow-up    RM 12, alone. Last seen 09/22/2018  . Multiple Sclerosis    On Tecfidera. Getting 30 days supply from pharmacy. Has not had any issues receiving so far for the new year.     HISTORY OF PRESENT ILLNESS:  Joy Cobb is a 39 yo woman with MS diagnosed in 2012.     Update 03/29/2019:    She feels she is stable and denies any new exacerbations.   She is on Tecfidera a and tolerates it well.      Gait does well unless the hip pain worsens.   She feels her pain is better on Fentanyl but that it is less helpful the last day of each 3 day patch.    She still feels she has done better on the patch than she did on pills in the past.     She is also on Lyrica with some benefit.   She feels she could walk a 1/4 mile without stopping.     She notes the left leg is mildly weaker than her right side and she notes some spasticity.    She notes numbness and tingling in the hands and feet.    Her vision is doing well.    Bladder is doing the same with some urgency/frequency.   No hesitancy. .   She had a UTI in November with more frequency.  She notes fatigue and rests most of the day.   In the past, she was on phentermine and that helped her fatigue.    Insomnia is better on trazodone.    She has had some REM behavior issues --- talking, tried to punch bed partner.  She does not recall whether she did better when she was on diazepam.  The PDMP was reviewed.  She is only getting controlled substances from our office.  She is not escalating doses.   Update 08/13/2017: She feels her MS is mostly stable on Tecfidera.   She has no exacerbation.     Gait is ok with occasional stumbles.   Strength is ok.   She gets a buzzing sensation in  her left hand and numbness in her feet     Bladder is doing ok.    She feels her pain is better on Fentanyl but that it is less helpful the last day of each 3 day patch.    She still feels she has done better on the patch than she did on pills in the past.     She is also on Lyrica  She is sometimes moody but denies worsening depression.   She has fatigue.     She has sometimes punched or kicked at night while asleep.   We discussed that she might have RBD.   She is on diazepam and has been on it a while so she is not sure how much if any if has helped.   Energy drinks help her fatigue some and she tries not to take later in the day.       Update 12/11/2016: She is on Tecfidera and tolerates it well.     She denies any new exacerbations.  Her gait is off balanced.   She has left leg weakness and spasticity.  Baclofen helps spasticity.    She has has left ankle pain.  Initially she had some swelling in it.   She was told she might have plantar fasciitis and also was found to have a bone spur.    Her PCP advised her to f/u with podiatry.   She did not take prednisone as she had AVN in the past (hips).   She has a lot of pain in her legs.  She is on Lyrica and fentanyl patches (25 mcg).    Last UDS that showed THC as well as the appropriate medications. As she is also on diazepam, I will keep the fentanyl dose of 25 g  She has depression.   She does not feel antidepressants like prozac have helped.  She feels she is doing worse. There is no suicidal ideation. She has been referred to [facility].   She was prescribed hydroxyzine.      Sleep is a problem.   She falls asleep easily with diazepam.   She has active dreams despite the diazepam (which only helped some).     Fatigue is better on phentermine but she did not lose weight so she stopped.      ___________________________________________________ Form 05/06/2016:   Gait/strength/sensation:   She reports mild leg pain and has a lot  of hip pain that limits her walking.  She notes tingling sometimes with pain in the feet. Her gait is mildly off but when she is tired her gait is worse. She has mild spasticity in her legs that has been helped by the baclofen. She notes pain  mostly in the legs and back. Pain is achy.   She takes Lyrica and is also on fentanyl for the pain.  Hip pain:   She reports pain in her hips and her back.   She reports her thighs hurt to the touch.     She has avascular necrosis and reports being told she will need replacement.   She had been on multiple courses of IV Solu-Medrol at the time of diagnosis (up in [place]).      Vision:   She denies any current problems with her eyes and feels that she recovered well from the optic neuritis.    She notes light sensitivity at night   Sleep:    Valium ws started at the last visit for bizarre sleep behavior recently, likely REM behavior disorder. She now talks some in sleep but no yelling or kicking.    Her PSG last year, did not show any parasomnias or OSA.    She has excessive daytime sleepiness.   .  She sleeps 5-6 hrs a night now which is better than last year.     Bladder:  She notes some bladder frequency and urgency. She had been prescribed location in the past but she does not take it on a regular basis because this problem is not as bad as it had been in the past.  Mood/cognitive:   She notes less depression this year than last year.   She sees [employer].   She denies suicide ideation or plan.  Cognition is mildly impaired.  Specifically, she notes impaired short-term memory, attention, and word finding.   She loses items a lot. She'll sometimes read a paragraph and not remember what she had just read.     Fatigue/sleep/weight:  She continues to have a lot of fatigue and excessive sleepiness. She has gained 20 pounds in the past year.  Phentermine helped the fatigue and she noted she gained more weight when she stopped 2 months ago.       MS  History:  She was diagnosed with multiple sclerosis in April 2012 after her second episode of optic neuritis. Her previous episode was about 7 or 8 months earlier and an MRI of the brain at that time did not show changes consistent with MS. After her second episode she had another MRI which showed some changes in the brain consistent with MS. She received IV steroids for the second optic neuritis.  First episode was left and second episode was right.   A lumbar puncture showed the CSF was consistent with Multiple sclerosis.   She was initially placed on Betaseron. However, she continued to have exacerbations. Additionally, she had difficulty tolerating it well. In 2012, she had 3 other exacerbations while on Betaseron. At least some of them involved her spine and she had evidence on MRI and bilateral changes in her legs on exam. Due to the aggressiveness of the MS, she received 8 courses of IV Cytoxan while she was living in [place].  She then was switched to Tecfidera in 2013 and remains on that medication.  She moved to [facility] in 2013 and started to see Dr. Leotis Shames. When he moved further away, she transferred her care to me. In 2014, she had some worsening gait that was felt to be due to an exacerbation that she receive a course of steroids. She has not had any further exacerbations while on Tecfidera.  Her last MRI was early 2015 at [facility].    REVIEW OF SYSTEMS:  Constitutional: No fevers, chills, sweats, or change in appetite.   Lost 29 pounds Eyes: No visual changes, double vision, eye pain Ear, nose and throat: No hearing loss, ear pain, nasal congestion, sore throat Cardiovascular: No chest pain, palpitations Respiratory:  No shortness of breath at rest or with exertion.   Snores but no OSA GastrointestinaI: Sh notes constipation.  No nausea, vomiting, diarrhea, abdominal pain, fecal incontinence Genitourinary:  Some urgency and frequency.. Musculoskeletal:  No neck pain, back  pain Integumentary: No rash, pruritus, skin lesions Neurological: as above Psychiatric: see above Endocrine: No palpitations, diaphoresis, change in appetite, or increased thirst Hematologic/Lymphatic:  No anemia, purpura, petechiae. Allergic/Immunologic: No itchy/runny eyes, nasal congestion, recent allergic reactions, rashes  ALLERGIES: Allergies  Allergen Reactions  . Amoxicillin   . Penicillins Hives  . Temazepam     Sharp pains in stomach  . Septra [Sulfamethoxazole-Trimethoprim] Rash    HOME MEDICATIONS: Outpatient Medications Prior to Visit  Medication Sig Dispense Refill  . albuterol (VENTOLIN HFA) 108 (90 Base) MCG/ACT inhaler Inhale 2 puffs into the lungs every 6 (six) hours as needed for wheezing or shortness of breath. 2 each 2  . baclofen (LIORESAL) 10 MG tablet TAKE 1 TABLET (10 MG TOTAL) BY MOUTH 3 (THREE) TIMES DAILY. 270 tablet 3  . drospirenone-ethinyl estradiol (YAZ,GIANVI,LORYNA) 3-0.02 MG tablet Take 1 tablet by mouth daily.    . famotidine (PEPCID) 40 MG tablet     . fentaNYL (DURAGESIC) 25 MCG/HR Place 1 patch onto the skin every 3 (three) days. 10 patch 0  . FLUoxetine (PROZAC) 40 MG capsule     . linaclotide (LINZESS) 290 MCG CAPS capsule Take 290 mcg by mouth daily before breakfast.    . meloxicam (MOBIC) 15 MG tablet Take 15 mg by mouth daily as needed (For hip pain flare ups).   2  . metoCLOPramide (REGLAN)  5 MG tablet     . pregabalin (LYRICA) 150 MG capsule TAKE ONE CAPSULE BY MOUTH TWICE A DAY TO 3 TIMES A DAY 90 capsule 5  . sucralfate (CARAFATE) 1 GM/10ML suspension     . TECFIDERA 240 MG CPDR TAKE 1 CAPSULE (240MG ) BY  MOUTH TWICE DAILY 180 capsule 1  . traZODone (DESYREL) 50 MG tablet Take 100 mg by mouth at bedtime.    . Vitamin D, Ergocalciferol, (DRISDOL) 50000 units CAPS capsule Take 50,000 Units by mouth 2 (two) times a week.     . Azelastine HCl 0.15 % SOLN 1-2 SPRAYS IN EACH NOSTRIL 1-2 TIMES A DAY AS NEEDED FOR DRAINAGE. 30 mL 0  .  mirtazapine (REMERON) 15 MG tablet     . SINGULAIR 10 MG tablet      No facility-administered medications prior to visit.    PAST MEDICAL HISTORY: Past Medical History:  Diagnosis Date  . Bladder spasms   . Colonic inertia   . IBS (irritable bowel syndrome)   . Multiple sclerosis (HCC)   . Neuropathy   . Renal disorder   . Vision abnormalities     PAST SURGICAL HISTORY: Past Surgical History:  Procedure Laterality Date  . BREAST SURGERY    . CYSTECTOMY    . polyp removal      FAMILY HISTORY: Family History  Problem Relation Age of Onset  . Neuropathy Mother   . Allergic rhinitis Mother   . Urticaria Mother   . Cancer - Other Father   . Diabetes Maternal Grandmother   . Hypertension Maternal Grandmother   . Kidney failure Maternal Grandmother   . Parkinson's disease Maternal Grandmother   . Thyroid disease Maternal Aunt   . Breast cancer Maternal Aunt   . Parkinson's disease Maternal Aunt   . Asthma Neg Hx   . Eczema Neg Hx     SOCIAL HISTORY:  Social History   Socioeconomic History  . Marital status: Single    Spouse name: Not on file  . Number of children: Not on file  . Years of education: Not on file  . Highest education level: Not on file  Occupational History  . Not on file  Tobacco Use  . Smoking status: Never Smoker  . Smokeless tobacco: Never Used  Substance and Sexual Activity  . Alcohol use: Yes  . Drug use: No  . Sexual activity: Not Currently  Other Topics Concern  . Not on file  Social History Narrative  . Not on file   Social Determinants of Health   Financial Resource Strain:   . Difficulty of Paying Living Expenses: Not on file  Food Insecurity:   . Worried About in the Last Year: Not on file  . Ran Out of Food in the Last Year: Not on file  Transportation Needs:   . Lack of Transportation (Medical): Not on file  . Lack of Transportation (Non-Medical): Not on file  Physical Activity:   . Days of  Exercise per Week: Not on file  . Minutes of Exercise per Session: Not on file  Stress:   . Feeling of Stress : Not on file  Social Connections:   . Frequency of Communication with Friends and Family: Not on file  . Frequency of Social Gatherings with Friends and Family: Not on file  . Attends Religious Services: Not on file  . Active Member of Clubs or Organizations: Not on file  . Attends Programme researcher, broadcasting/film/video Meetings:  Not on file  . Marital Status: Not on file  Intimate Partner Violence:   . Fear of Current or Ex-Partner: Not on file  . Emotionally Abused: Not on file  . Physically Abused: Not on file  . Sexually Abused: Not on file     PHYSICAL EXAM  Vitals:   03/29/19 1113  BP: (!) 147/91  Pulse: 75  Temp: (!) 97.4 F (36.3 C)  Weight: 258 lb 8 oz (117.3 kg)  Height:  (1.651 m)    Body mass index is 43.02 kg/m.   General: The patient is well-developed and well-nourished and in no acute distress  Neurologic Exam  Mental status: The patient is alert and oriented x 3 at the time of the examination. The patient has apparent normal recent and remote memory, with an apparently normal attention span and concentration ability.   Speech is normal.  Cranial nerves: Extraocular movements are full.  She has a mild right APD.  Color saturation was symmetric.  Facial strength and sensation was normal.. The tongue is midline, and the patient has symmetric elevation of the soft palate. No obvious hearing deficits are noted.  Motor:  Muscle bulk is normal. Tone is slightly increased in both legs.. Strength is  5 / 5 in all 4 extremities.   Sensory: Intact sensation to touch and vibration in the arms and legs.  Coordination: Cerebellar testing reveals good finger-nose-finger and slightly reduced heel-to-shin bilaterally.  Gait and station: Station is normal.  The gait is mildly wide and the tandem gait is wide.. Romberg is negative.   Reflexes: Deep tendon reflexes are  symmetric and increased at the knees with crossed adductors    DIAGNOSTIC DATA (LABS, IMAGING, TESTING) - I reviewed patient records, labs, notes, testing and imaging myself where available.  Lab Results  Component Value Date   WBC 5.3 03/19/2018   HGB 11.5 03/19/2018   HCT 33.8 (L) 03/19/2018   MCV 87 03/19/2018   PLT 241 03/19/2018      Component Value Date/Time   NA 136 02/14/2012 2345   K 3.7 02/14/2012 2345   CL 99 02/14/2012 2345   CO2 29 02/14/2012 2345   GLUCOSE 98 02/14/2012 2345   BUN 13 02/14/2012 2345   CREATININE 0.76 02/14/2012 2345   CALCIUM 9.2 02/14/2012 2345   PROT 7.1 03/14/2015 1631   ALBUMIN 4.2 03/14/2015 1631   AST 13 03/14/2015 1631   ALT 11 03/14/2015 1631   ALKPHOS 74 03/14/2015 1631   BILITOT <0.2 03/14/2015 1631   GFRNONAA >90 02/14/2012 2345   GFRAA >90 02/14/2012 2345      ASSESSMENT AND PLAN   Multiple sclerosis (HCC) - Plan: MR BRAIN W WO CONTRAST, CBC with Differential/Platelet  Chronic pain of both hips - Plan: Drug Screen, Ur (12+Oxycodone+Crt)  REM behavioral disorder  High risk medication use - Plan: CBC with Differential/Platelet  Chronic prescription opiate use - Plan: Drug Screen, Ur (12+Oxycodone+Crt)   1.   Continue Tecfidera.  Check CBC.  Check Brain MRI to determine if any subclinical progression.  If there are subclinical progression changes, consider a different disease modifying therapy. 2.  I considered clonazepam nightly for RBD.   However, due to being on fentanyl and also using marijuana, she will remain off at this time.   3.   PDMP reviewed.   Prescriptions for controlled substances only coming from this office.    4.   She will return to see me in 4 months or sooner  if she has new or worsening neurologic symptoms   Zackaria Burkey A. Felecia Shelling, MD, PhD 02/06/129, 4:38 PM Certified in Neurology, Clinical Neurophysiology, Sleep Medicine, Pain Medicine and Neuroimaging  [facility] [address]  [place], [place] [address] [phone]

## 2019-03-30 LAB — MED LIST OPTION NOT SELECTED

## 2019-04-03 LAB — CBC WITH DIFFERENTIAL/PLATELET
Basophils Absolute: 0 10*3/uL (ref 0.0–0.2)
Basos: 0 %
EOS (ABSOLUTE): 0 10*3/uL (ref 0.0–0.4)
Eos: 1 %
Hematocrit: 33.1 % — ABNORMAL LOW (ref 34.0–46.6)
Hemoglobin: 11.6 g/dL (ref 11.1–15.9)
Immature Grans (Abs): 0 10*3/uL (ref 0.0–0.1)
Immature Granulocytes: 0 %
Lymphocytes Absolute: 1.3 10*3/uL (ref 0.7–3.1)
Lymphs: 28 %
MCH: 29.1 pg (ref 26.6–33.0)
MCHC: 35 g/dL (ref 31.5–35.7)
MCV: 83 fL (ref 79–97)
Monocytes Absolute: 0.4 10*3/uL (ref 0.1–0.9)
Monocytes: 8 %
Neutrophils Absolute: 2.8 10*3/uL (ref 1.4–7.0)
Neutrophils: 63 %
Platelets: 203 10*3/uL (ref 150–450)
RBC: 3.98 x10E6/uL (ref 3.77–5.28)
RDW: 13.5 % (ref 11.7–15.4)
WBC: 4.5 10*3/uL (ref 3.4–10.8)

## 2019-04-03 LAB — DRUG SCREEN, UR (12+OXYCODONE+CRT)
Amphetamine Scrn, Ur: NEGATIVE ng/mL
BARBITURATE SCREEN URINE: NEGATIVE ng/mL
BENZODIAZEPINE SCREEN, URINE: NEGATIVE ng/mL
CANNABINOIDS UR QL SCN: POSITIVE — AB
Cocaine (Metab) Scrn, Ur: NEGATIVE ng/mL
Creatinine(Crt), U: 226 mg/dL (ref 20.0–300.0)
Fentanyl, Urine: POSITIVE — AB
Meperidine Screen, Urine: NEGATIVE ng/mL
Methadone Screen, Urine: NEGATIVE ng/mL
OXYCODONE+OXYMORPHONE UR QL SCN: NEGATIVE ng/mL
Opiate Scrn, Ur: NEGATIVE ng/mL
Ph of Urine: 6.3 (ref 4.5–8.9)
Phencyclidine Qn, Ur: NEGATIVE ng/mL
Propoxyphene Scrn, Ur: NEGATIVE ng/mL
SPECIFIC GRAVITY: 1.031
Tramadol Screen, Urine: NEGATIVE ng/mL

## 2019-04-05 ENCOUNTER — Telehealth: Payer: Self-pay | Admitting: Neurology

## 2019-04-05 NOTE — Telephone Encounter (Signed)
[employer] medicare order sent to GI. No auth they will reach out to the patient to schedule.  

## 2019-04-08 ENCOUNTER — Ambulatory Visit: Payer: Medicare Other | Admitting: Psychology

## 2019-04-08 DIAGNOSIS — M87059 Idiopathic aseptic necrosis of unspecified femur: Secondary | ICD-10-CM | POA: Diagnosis not present

## 2019-04-08 DIAGNOSIS — K5900 Constipation, unspecified: Secondary | ICD-10-CM | POA: Diagnosis not present

## 2019-04-08 DIAGNOSIS — G894 Chronic pain syndrome: Secondary | ICD-10-CM | POA: Diagnosis not present

## 2019-04-08 DIAGNOSIS — G35 Multiple sclerosis: Secondary | ICD-10-CM | POA: Diagnosis not present

## 2019-04-12 ENCOUNTER — Other Ambulatory Visit: Payer: Self-pay | Admitting: Neurology

## 2019-04-12 MED ORDER — FENTANYL 25 MCG/HR TD PT72: 1.0000 | MEDICATED_PATCH | TRANSDERMAL | 0 refills | Status: DC

## 2019-04-12 NOTE — Telephone Encounter (Signed)
Pt has called for a refill on fentaNYL (DURAGESIC) 25 MCG/HR [id number] -  Pt also scheduled her 4 mo f/u

## 2019-04-12 NOTE — Addendum Note (Signed)
Addended by: Arther Abbott on: 04/12/2019 02:22 PM   Modules accepted: Orders

## 2019-04-14 ENCOUNTER — Ambulatory Visit (INDEPENDENT_AMBULATORY_CARE_PROVIDER_SITE_OTHER): Payer: Medicare Other | Admitting: Psychology

## 2019-04-14 DIAGNOSIS — F329 Major depressive disorder, single episode, unspecified: Secondary | ICD-10-CM | POA: Diagnosis not present

## 2019-04-23 ENCOUNTER — Other Ambulatory Visit: Payer: Self-pay | Admitting: Neurology

## 2019-04-23 NOTE — Telephone Encounter (Signed)
Last rx sent 03/17/19 w/ 5 additional refills. I called the patient back to let her know she just need to notify the pharmacy that she is ready for her prescription.

## 2019-04-23 NOTE — Telephone Encounter (Signed)
Pt has called for a refill on her  pregabalin (LYRICA) 150 MG capsule  [id number]

## 2019-04-29 ENCOUNTER — Ambulatory Visit (INDEPENDENT_AMBULATORY_CARE_PROVIDER_SITE_OTHER): Payer: Medicare Other | Admitting: Psychology

## 2019-04-29 DIAGNOSIS — F329 Major depressive disorder, single episode, unspecified: Secondary | ICD-10-CM | POA: Diagnosis not present

## 2019-05-11 DIAGNOSIS — G894 Chronic pain syndrome: Secondary | ICD-10-CM | POA: Diagnosis not present

## 2019-05-11 DIAGNOSIS — G35 Multiple sclerosis: Secondary | ICD-10-CM | POA: Diagnosis not present

## 2019-05-11 DIAGNOSIS — K5900 Constipation, unspecified: Secondary | ICD-10-CM | POA: Diagnosis not present

## 2019-05-11 DIAGNOSIS — M87059 Idiopathic aseptic necrosis of unspecified femur: Secondary | ICD-10-CM | POA: Diagnosis not present

## 2019-05-13 ENCOUNTER — Other Ambulatory Visit: Payer: Self-pay | Admitting: Neurology

## 2019-05-13 MED ORDER — FENTANYL 25 MCG/HR TD PT72: 1.0000 | MEDICATED_PATCH | TRANSDERMAL | 0 refills | Status: DC

## 2019-05-13 NOTE — Telephone Encounter (Signed)
1) Medication(s) Requested (by name): fentaNYL (DURAGESIC) 25 MCG/HR   2) Pharmacy of Choice:  [phone] [address], [place] - [phone] [facility] [address] [facility] [address]  [address] [place], [place] [address]  Phone:  [phone] Fax:  [phone]

## 2019-05-20 ENCOUNTER — Ambulatory Visit: Payer: Medicare Other | Admitting: Psychology

## 2019-06-10 ENCOUNTER — Ambulatory Visit: Payer: Medicare Other | Admitting: Psychology

## 2019-06-14 ENCOUNTER — Other Ambulatory Visit: Payer: Self-pay | Admitting: Physician Assistant

## 2019-06-14 DIAGNOSIS — Z1231 Encounter for screening mammogram for malignant neoplasm of breast: Secondary | ICD-10-CM

## 2019-06-16 ENCOUNTER — Other Ambulatory Visit: Payer: Self-pay | Admitting: Neurology

## 2019-06-16 MED ORDER — FENTANYL 25 MCG/HR TD PT72: 1.0000 | MEDICATED_PATCH | TRANSDERMAL | 0 refills | Status: DC

## 2019-06-16 NOTE — Addendum Note (Signed)
Addended by: Arther Abbott on: 06/16/2019 03:20 PM   Modules accepted: Orders

## 2019-06-16 NOTE — Telephone Encounter (Signed)
Pt has called for a refill on her fentaNYL (DURAGESIC) 25 MCG/HR [id number]

## 2019-06-18 ENCOUNTER — Other Ambulatory Visit: Payer: Self-pay

## 2019-06-18 ENCOUNTER — Ambulatory Visit
Admission: RE | Admit: 2019-06-18 | Discharge: 2019-06-18 | Disposition: A | Discharge status: Home / Self Care | Payer: Medicare Other | Source: Ambulatory Visit | Attending: Physician Assistant | Admitting: Physician Assistant

## 2019-06-18 DIAGNOSIS — Z1231 Encounter for screening mammogram for malignant neoplasm of breast: Secondary | ICD-10-CM

## 2019-07-14 DIAGNOSIS — K5900 Constipation, unspecified: Secondary | ICD-10-CM | POA: Diagnosis not present

## 2019-07-14 DIAGNOSIS — G35 Multiple sclerosis: Secondary | ICD-10-CM | POA: Diagnosis not present

## 2019-07-14 DIAGNOSIS — M87059 Idiopathic aseptic necrosis of unspecified femur: Secondary | ICD-10-CM | POA: Diagnosis not present

## 2019-07-14 DIAGNOSIS — R05 Cough: Secondary | ICD-10-CM | POA: Diagnosis not present

## 2019-07-19 ENCOUNTER — Other Ambulatory Visit: Payer: Self-pay | Admitting: Neurology

## 2019-07-19 MED ORDER — FENTANYL 25 MCG/HR TD PT72: 1.0000 | MEDICATED_PATCH | TRANSDERMAL | 0 refills | Status: DC

## 2019-07-19 NOTE — Telephone Encounter (Signed)
Pt has called for a refill on her fentaNYL (DURAGESIC) 25 MCG/HR to [id number] 

## 2019-07-19 NOTE — Telephone Encounter (Signed)
Per [place] registry, last filled 06/16/2019 Fentanyl 25 Mcg/hr Patch #10.00 for 30 day supply. Pending appt 07/27/19. Will send request for one month's supply to Dr. Epimenio Foot.

## 2019-07-19 NOTE — Addendum Note (Signed)
Addended by: Bertram Savin on: 07/19/2019 03:41 PM   Modules accepted: Orders

## 2019-07-20 ENCOUNTER — Other Ambulatory Visit: Payer: Medicare Other

## 2019-07-27 ENCOUNTER — Telehealth (INDEPENDENT_AMBULATORY_CARE_PROVIDER_SITE_OTHER): Payer: Medicare Other | Admitting: Neurology

## 2019-07-27 ENCOUNTER — Encounter: Payer: Self-pay | Admitting: Neurology

## 2019-07-27 ENCOUNTER — Telehealth: Payer: Self-pay | Admitting: *Deleted

## 2019-07-27 DIAGNOSIS — G35 Multiple sclerosis: Secondary | ICD-10-CM

## 2019-07-27 DIAGNOSIS — Z79899 Other long term (current) drug therapy: Secondary | ICD-10-CM | POA: Diagnosis not present

## 2019-07-27 DIAGNOSIS — M25551 Pain in right hip: Secondary | ICD-10-CM | POA: Diagnosis not present

## 2019-07-27 DIAGNOSIS — G8929 Other chronic pain: Secondary | ICD-10-CM

## 2019-07-27 DIAGNOSIS — R208 Other disturbances of skin sensation: Secondary | ICD-10-CM | POA: Diagnosis not present

## 2019-07-27 DIAGNOSIS — G4700 Insomnia, unspecified: Secondary | ICD-10-CM

## 2019-07-27 DIAGNOSIS — G4752 REM sleep behavior disorder: Secondary | ICD-10-CM

## 2019-07-27 DIAGNOSIS — M25552 Pain in left hip: Secondary | ICD-10-CM

## 2019-07-27 MED ORDER — BACLOFEN 10 MG PO TABS: 10.0000 mg | ORAL_TABLET | Freq: Three times a day (TID) | ORAL | 3 refills | Status: DC

## 2019-07-27 NOTE — Telephone Encounter (Signed)
Called pt. Reminded her about mychart VV today at 300pm. Advised her to log on about 10-15 min prior. Updated med list, pharmacy, allergy list.

## 2019-07-27 NOTE — Progress Notes (Signed)
[facility]  PATIENT: Joy Cobb DOB: December 14, 1980  REFERRING CLINICIAN: Greggory Stallion Osei-Bonsu  HISTORY FROM: [name] REASON FOR VISIT: MS and related symptoms   HISTORICAL  CHIEF COMPLAINT:  Chief Complaint  Patient presents with  . Pain  . Multiple Sclerosis    HISTORY OF PRESENT ILLNESS:  Joy Cobb is a 39 yo woman with MS diagnosed in 2012.     Update 07/27/2019:    Virtual Visit via Telephone Note  I connected with Joy Cobb on 07/27/19 at  3:00 PM EDT by telephone and verified that I am speaking with the correct person using two identifiers.  Location: Patient: Home Home Provider: Office   I discussed the limitations, risks, security and privacy concerns of performing an evaluation and management service by telephone and the availability of in person appointments. I also discussed with the patient that there may be a patient responsible charge related to this service. The patient expressed understanding and agreed to proceed.   History of Present Illness: She is on Tecfidera.  She tolerates it well.  No recent exacerbations.  We will check another MRI in July.  Last MRI showed no new lesions.  Her gait is doing about the same.  She feels she can walk 1/4 mile without stopping.  She does worse when she is tired or when her pain flares up.    She notes the left leg is mildly weaker than her right side and she notes some spasticity.    She notes numbness and tingling in the hands and feet.    She feels her pain is better on Fentanyl but that it is less helpful the last day of each 3 day patch.    She still feels she has done better on the patch than she did on pills in the past.     She is also on Lyrica with some benefit.      Her vision is doing well.    Bladder function is doing well.  She notes fatigue and rests most of the day.   In the past, she was on phentermine and that helped her fatigue.    Insomnia is better on trazodone.    She  has had some REM behavior issues --- talking, tried to punch bed partner.  She does not recall whether she did better when she was on diazepam.    MS History:  She was diagnosed with multiple sclerosis in April 2012 after her second episode of optic neuritis. Her previous episode was about 7 or 8 months earlier and an MRI of the brain at that time did not show changes consistent with MS. After her second episode she had another MRI which showed some changes in the brain consistent with MS. She received IV steroids for the second optic neuritis.  First episode was left and second episode was right.   A lumbar puncture showed the CSF was consistent with Multiple sclerosis.   She was initially placed on Betaseron. However, she continued to have exacerbations. Additionally, she had difficulty tolerating it well. In 2012, she had 3 other exacerbations while on Betaseron. At least some of them involved her spine and she had evidence on MRI and bilateral changes in her legs on exam. Due to the aggressiveness of the MS, she received 8 courses of IV Cytoxan while she was living in [place].  She then was switched to Tecfidera in 2013 and remains on that medication.  She moved to [place] in  2013 and started to see Dr. Leotis Shames. When he moved further away, she transferred her care to me. In 2014, she had some worsening gait that was felt to be due to an exacerbation that she receive a course of steroids. She has not had any further exacerbations while on Tecfidera.  Her last MRI was early 2015 at [facility].  MRI of the brain 09/08/2017 showed stable MS changes     Observations/Objective: She was alert and oriented.  Fluent speech.    REVIEW OF SYSTEMS:  Constitutional: No fevers, chills, sweats, or change in appetite.   Lost 29 pounds Eyes: No visual changes, double vision, eye pain Ear, nose and throat: No hearing loss, ear pain, nasal congestion, sore throat Cardiovascular: No chest pain,  palpitations Respiratory:  No shortness of breath at rest or with exertion.   Snores but no OSA GastrointestinaI: Sh notes constipation.  No nausea, vomiting, diarrhea, abdominal pain, fecal incontinence Genitourinary:  Some urgency and frequency.. Musculoskeletal:  No neck pain, back pain Integumentary: No rash, pruritus, skin lesions Neurological: as above Psychiatric: see above Endocrine: No palpitations, diaphoresis, change in appetite, or increased thirst Hematologic/Lymphatic:  No anemia, purpura, petechiae. Allergic/Immunologic: No itchy/runny eyes, nasal congestion, recent allergic reactions, rashes  ALLERGIES: Allergies  Allergen Reactions  . Amoxicillin   . Penicillins Hives  . Temazepam     Sharp pains in stomach  . Septra [Sulfamethoxazole-Trimethoprim] Rash    HOME MEDICATIONS: Outpatient Medications Prior to Visit  Medication Sig Dispense Refill  . albuterol (VENTOLIN HFA) 108 (90 Base) MCG/ACT inhaler Inhale 2 puffs into the lungs every 6 (six) hours as needed for wheezing or shortness of breath. 2 each 2  . baclofen (LIORESAL) 10 MG tablet TAKE 1 TABLET (10 MG TOTAL) BY MOUTH 3 (THREE) TIMES DAILY. 270 tablet 3  . cetirizine (ZYRTEC) 10 MG tablet Take 10 mg by mouth daily.    . drospirenone-ethinyl estradiol (YAZ,GIANVI,LORYNA) 3-0.02 MG tablet Take 1 tablet by mouth daily.    . famotidine (PEPCID) 40 MG tablet Take 40 mg by mouth daily.     . fentaNYL (DURAGESIC) 25 MCG/HR Place 1 patch onto the skin every 3 (three) days. 10 patch 0  . FLUoxetine (PROZAC) 40 MG capsule     . linaclotide (LINZESS) 290 MCG CAPS capsule Take 290 mcg by mouth daily before breakfast.    . meloxicam (MOBIC) 15 MG tablet Take 15 mg by mouth daily as needed (For hip pain flare ups).   2  . metoCLOPramide (REGLAN) 5 MG tablet Take 10 mg by mouth in the morning and at bedtime.     . phentermine 37.5 MG capsule Take 1 capsule (37.5 mg total) by mouth every morning. 30 capsule 5  . pregabalin  (LYRICA) 150 MG capsule TAKE ONE CAPSULE BY MOUTH TWICE A DAY TO 3 TIMES A DAY 90 capsule 5  . sucralfate (CARAFATE) 1 GM/10ML suspension     . TECFIDERA 240 MG CPDR TAKE 1 CAPSULE (240MG ) BY  MOUTH TWICE DAILY 180 capsule 1  . traZODone (DESYREL) 50 MG tablet Take 100 mg by mouth at bedtime.    . Vitamin D, Ergocalciferol, (DRISDOL) 50000 units CAPS capsule Take 50,000 Units by mouth 2 (two) times a week.      No facility-administered medications prior to visit.    PAST MEDICAL HISTORY: Past Medical History:  Diagnosis Date  . Bladder spasms   . Colonic inertia   . IBS (irritable bowel syndrome)   . Multiple sclerosis (  HCC)   . Neuropathy   . Renal disorder   . Vision abnormalities     PAST SURGICAL HISTORY: Past Surgical History:  Procedure Laterality Date  . BREAST SURGERY    . CYSTECTOMY    . polyp removal      FAMILY HISTORY: Family History  Problem Relation Age of Onset  . Neuropathy Mother   . Allergic rhinitis Mother   . Urticaria Mother   . Cancer - Other Father   . Diabetes Maternal Grandmother   . Hypertension Maternal Grandmother   . Kidney failure Maternal Grandmother   . Parkinson's disease Maternal Grandmother   . Thyroid disease Maternal Aunt   . Breast cancer Maternal Aunt   . Parkinson's disease Maternal Aunt   . Asthma Neg Hx   . Eczema Neg Hx     SOCIAL HISTORY:  Social History   Socioeconomic History  . Marital status: Single    Spouse name: Not on file  . Number of children: Not on file  . Years of education: Not on file  . Highest education level: Not on file  Occupational History  . Not on file  Tobacco Use  . Smoking status: Never Smoker  . Smokeless tobacco: Never Used  Vaping Use  . Vaping Use: Never used  Substance and Sexual Activity  . Alcohol use: Yes  . Drug use: No  . Sexual activity: Not Currently  Other Topics Concern  . Not on file  Social History Narrative  . Not on file   Social Determinants of Health    Financial Resource Strain:   . Difficulty of Paying Living Expenses:   Food Insecurity:   . Worried About Programme researcher, broadcasting/film/video in the Last Year:   . Barista in the Last Year:   Transportation Needs:   . Freight forwarder (Medical):   Marland Kitchen Lack of Transportation (Non-Medical):   Physical Activity:   . Days of Exercise per Week:   . Minutes of Exercise per Session:   Stress:   . Feeling of Stress :   Social Connections:   . Frequency of Communication with Friends and Family:   . Frequency of Social Gatherings with Friends and Family:   . Attends Religious Services:   . Active Member of Clubs or Organizations:   . Attends Banker Meetings:   Marland Kitchen Marital Status:   Intimate Partner Violence:   . Fear of Current or Ex-Partner:   . Emotionally Abused:   Marland Kitchen Physically Abused:   . Sexually Abused:       Assessment and Plan: Multiple sclerosis (HCC)  High risk medication use  Chronic pain of both hips  Dysesthesia  REM behavioral disorder  Insomnia, unspecified type  1.   Continue Tecfidera.  We will check blood work at the next examination. 2.   The PDMP was reviewed.  She is only getting controlled substances from our office.  She is not escalating doses.  She will continue fentanyl 25 mcg.  Continue Lyrica for dysesthesias. 3.   Phentermine to help her continue to keep her weight off and to help with fatigue/attention. 4.   Trazodone nightly for sleep. 5.   Return in 4 months or sooner if there are new or worsening neurologic symptoms.  Follow Up Instructions: I discussed the assessment and treatment plan with the patient. The patient was provided an opportunity to ask questions and all were answered. The patient agreed with the plan and demonstrated an  understanding of the instructions.   The patient was advised to call back or seek an in-person evaluation if the symptoms worsen or if the condition fails to improve as anticipated.  I provided 15  minutes of non-face-to-face time during this encounter.    Elester Apodaca A. Felecia Shelling, MD, PhD 8/63/8177, 1:16 PM Certified in Neurology, Clinical Neurophysiology, Sleep Medicine, Pain Medicine and Neuroimaging  [facility] [address], [place], [place] [address] [phone]

## 2019-07-27 NOTE — Telephone Encounter (Signed)
Called pt. Scheduled 4 month f/u for 12/01/19 at 3:30pm per MD request with pt.

## 2019-07-28 ENCOUNTER — Other Ambulatory Visit: Payer: Self-pay

## 2019-08-18 ENCOUNTER — Other Ambulatory Visit: Payer: Self-pay | Admitting: Neurology

## 2019-08-18 MED ORDER — FENTANYL 25 MCG/HR TD PT72: 1.0000 | MEDICATED_PATCH | TRANSDERMAL | 0 refills | Status: DC

## 2019-08-18 NOTE — Addendum Note (Signed)
Addended by: Lindell Spar C on: 08/18/2019 05:01 PM   Modules accepted: Orders

## 2019-08-18 NOTE — Telephone Encounter (Signed)
Pt is requesting a refill for fentaNYL (DURAGESIC) 25 MCG/HR .  Pharmacy: [id number]   

## 2019-08-21 ENCOUNTER — Other Ambulatory Visit: Payer: Self-pay

## 2019-08-21 ENCOUNTER — Ambulatory Visit
Admission: RE | Admit: 2019-08-21 | Discharge: 2019-08-21 | Disposition: A | Discharge status: Home / Self Care | Payer: Medicare Other | Source: Ambulatory Visit | Attending: Neurology | Admitting: Neurology

## 2019-08-21 DIAGNOSIS — G35 Multiple sclerosis: Secondary | ICD-10-CM

## 2019-08-21 MED ORDER — GADOBENATE DIMEGLUMINE 529 MG/ML IV SOLN
20.0000 mL | Freq: Once | INTRAVENOUS | Status: AC | PRN
  Administered 2019-08-21: 20 mL via INTRAVENOUS

## 2019-08-30 IMAGING — MR MR PELVIS WO/W CM
6 of 12 series · 23 of 48 positions shown · IV contrast (multihance)
Comparison: Pelvic ultrasound on 09/05/2017

CLINICAL DATA: Uterine mass on recent ultrasound.

EXAM:
MRI PELVIS WITHOUT AND WITH CONTRAST
TECHNIQUE: Multiplanar multisequence MR imaging of the pelvis was performed
both before and after administration of intravenous contrast.
CONTRAST:  20mL MULTIHANCE GADOBENATE DIMEGLUMINE 529 MG/ML IV SOLN

[Series 3: T2 · coronal · 5.0mm · 0.78mm/px · 4 of 33 slices shown]
[im 1/33]
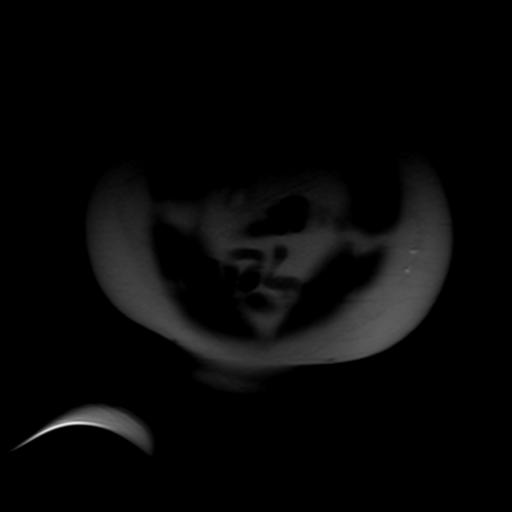
[im 11/33]
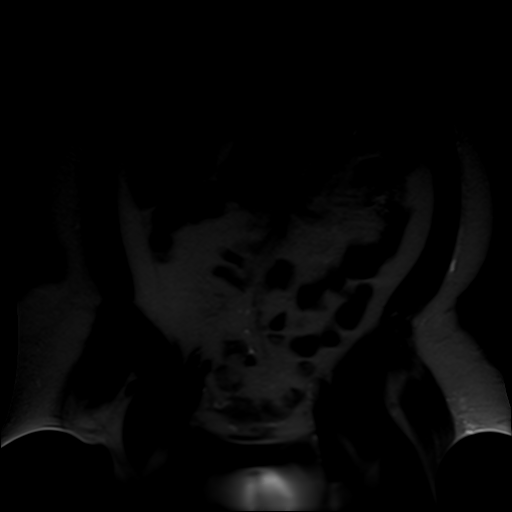
[im 22/33]
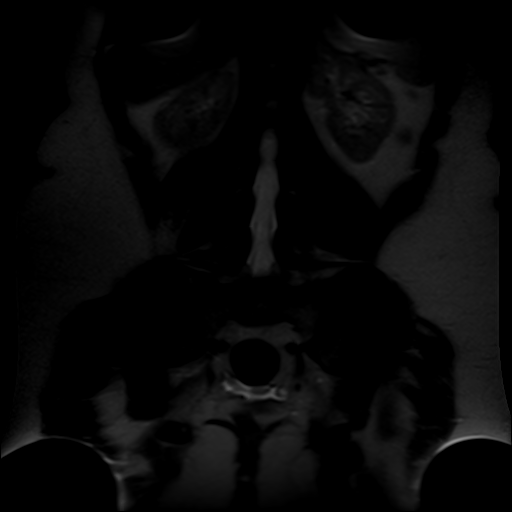
[im 33/33]
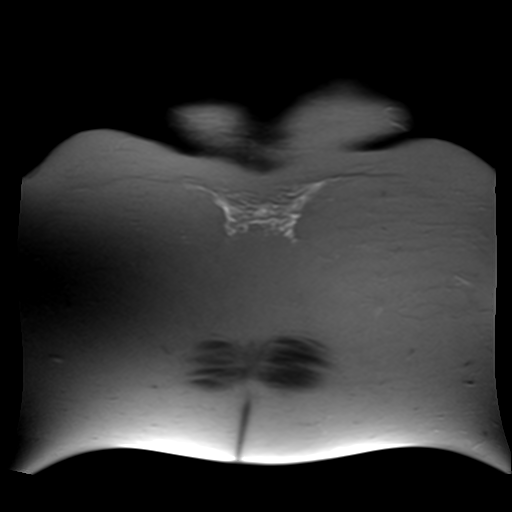

[Series 4: t2_tse_sag · sagittal · 5.0mm · 0.65mm/px · 4 of 33 slices shown]
[im 1/33]
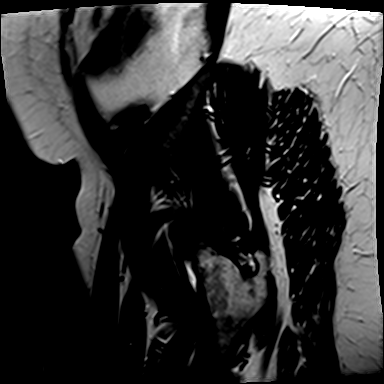
[im 11/33]
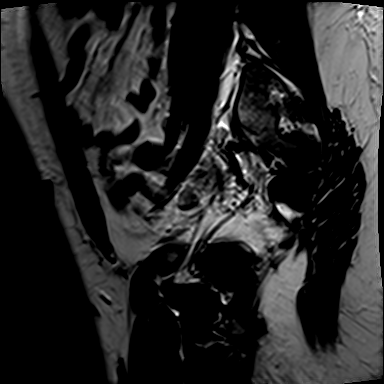
[im 22/33]
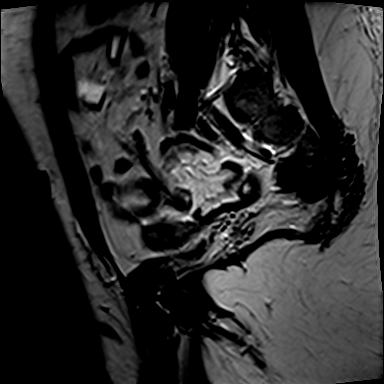
[im 33/33]
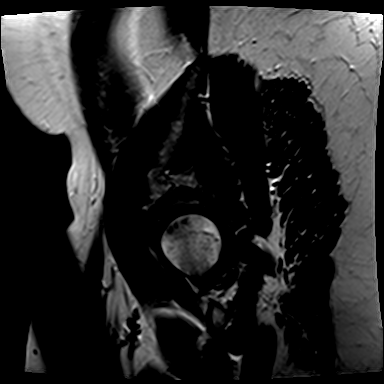

[Series 5: t2_tse axial · axial · 5.0mm · 0.51mm/px · z∈[-123,+52]mm · 4 of 29 slices shown]
[im 1/29]
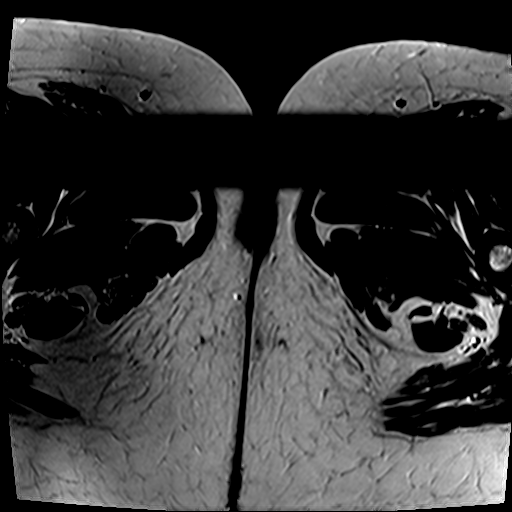
[im 10/29]
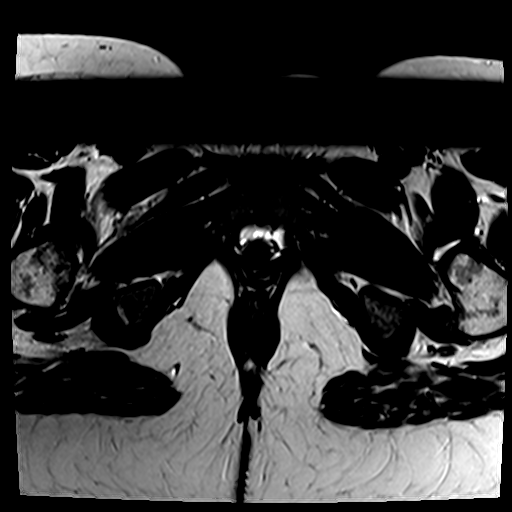
[im 19/29]
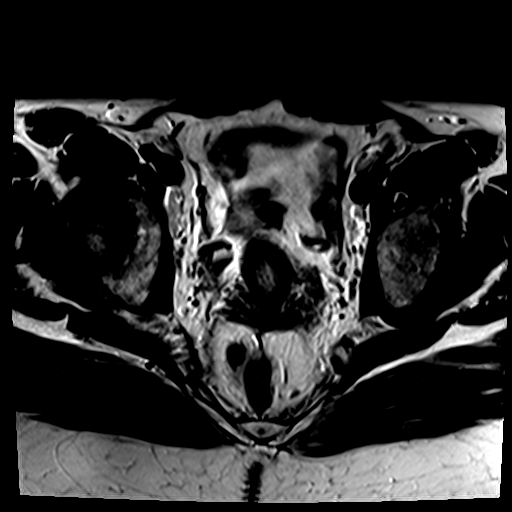
[im 29/29]
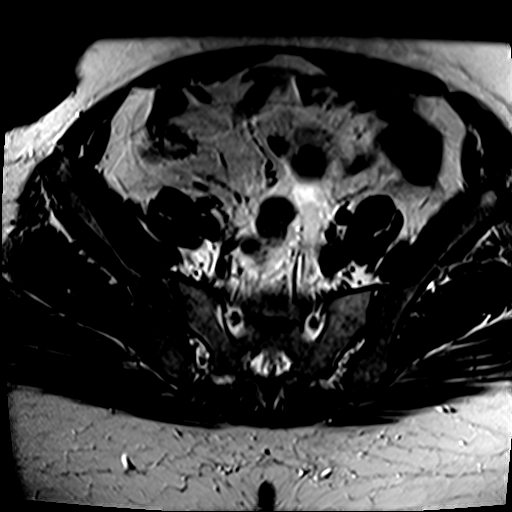

[Series 6: t2_tse axial fs · axial · 5.0mm · 0.51mm/px · z∈[-123,+52]mm · 4 of 29 slices shown]
[im 1/29]
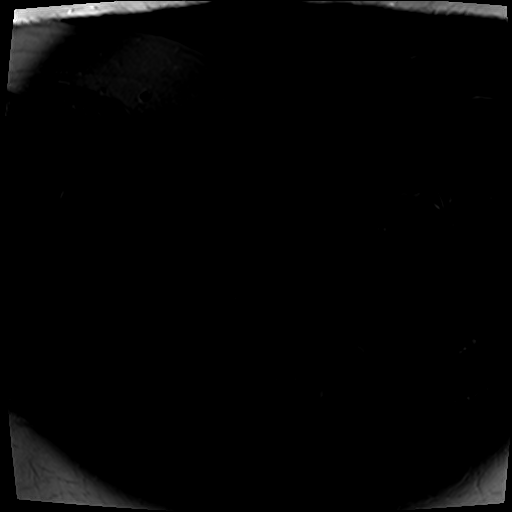
[im 10/29]
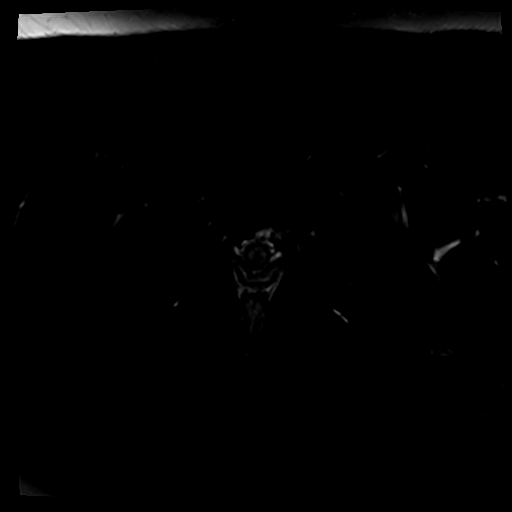
[im 19/29]
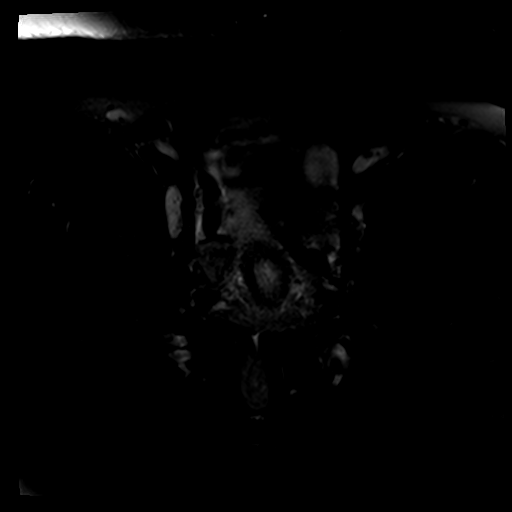
[im 29/29]
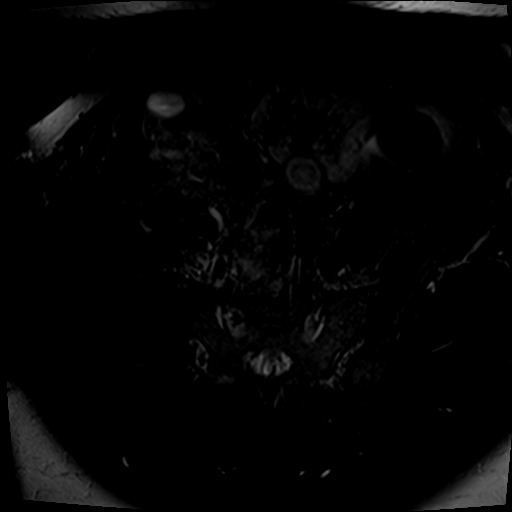

[Series 7: axial spgr · axial · 5.0mm · 0.51mm/px · z∈[-123,+52]mm · 4 of 29 slices shown]
[im 1/29]
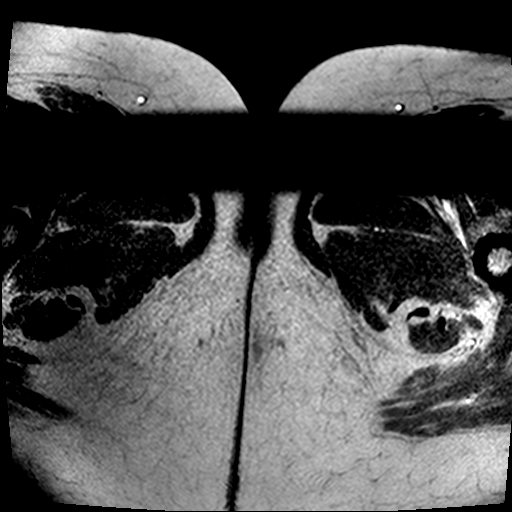
[im 10/29]
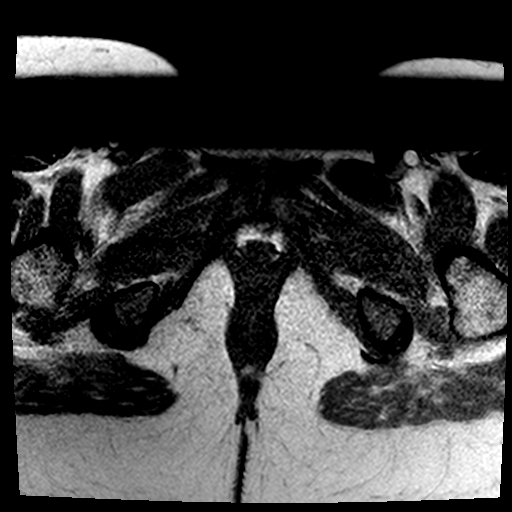
[im 19/29]
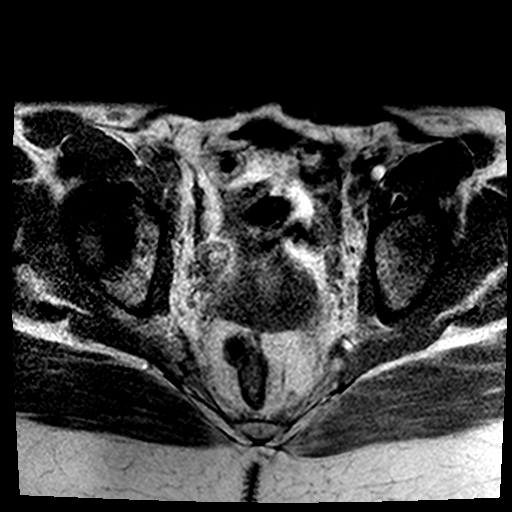
[im 29/29]
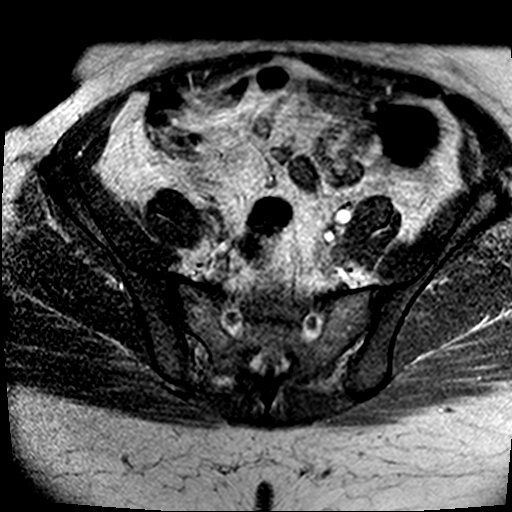

[Series 8: axial spgr fs · axial · non-contrast · 5.0mm · 1.02mm/px · z∈[-130,-5]mm · 3 of 31 slices shown]
[im 1/31]
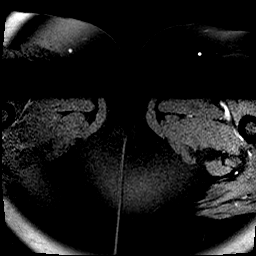
[im 11/31]
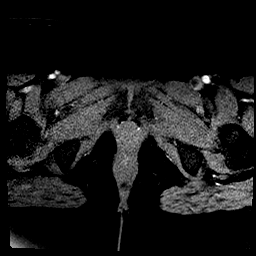
[im 21/31]
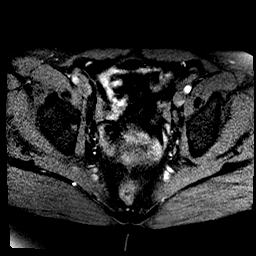

[23 of 48 positions shown; findings below may reference images not displayed]

FINDINGS: Urinary Tract: No urinary bladder or urethral abnormality.

Bowel: Unremarkable pelvic bowel loops.

Vascular/Lymphatic: Unremarkable. No pathologically enlarged pelvic
lymph nodes identified.

Reproductive:

-- Uterus: Measures 8.1 x 2.9 x 4.0 cm (volume = 49 cm^3). Two
tiny subserosal fibroids are seen in the anterior fundus, each
measuring approximately 1 cm. A sub-cm intramural fibroid is seen in
the left lateral corpus. No evidence junctional zone thickening or
cystic changes to suggest adenomyosis. Cervix and vagina are normal
in appearance.

-- Right ovary: Not directly visualized, however no adnexal mass
identified.

-- Left ovary: Not directly visualized, however no adnexal mass
identified.

Other: Trace amount of free fluid noted in pelvic cul-de-sac.

Musculoskeletal:  Unremarkable.
IMPRESSION: Several tiny uterine fibroids measuring up to 1 cm. No radiographic
evidence of uterine adenomyosis or other significant abnormality.

## 2019-09-08 DIAGNOSIS — L2389 Allergic contact dermatitis due to other agents: Secondary | ICD-10-CM | POA: Diagnosis not present

## 2019-09-08 DIAGNOSIS — M87059 Idiopathic aseptic necrosis of unspecified femur: Secondary | ICD-10-CM | POA: Diagnosis not present

## 2019-09-08 DIAGNOSIS — G35 Multiple sclerosis: Secondary | ICD-10-CM | POA: Diagnosis not present

## 2019-09-08 DIAGNOSIS — K5900 Constipation, unspecified: Secondary | ICD-10-CM | POA: Diagnosis not present

## 2019-09-20 ENCOUNTER — Other Ambulatory Visit: Payer: Self-pay | Admitting: Neurology

## 2019-09-20 MED ORDER — FENTANYL 25 MCG/HR TD PT72: 1.0000 | MEDICATED_PATCH | TRANSDERMAL | 0 refills | Status: DC

## 2019-09-20 NOTE — Telephone Encounter (Signed)
Pt is needing a refill on her fentaNYL (DURAGESIC) 25 MCG/HR [facility] [address]  

## 2019-09-21 DIAGNOSIS — G35 Multiple sclerosis: Secondary | ICD-10-CM | POA: Diagnosis not present

## 2019-09-21 DIAGNOSIS — M87059 Idiopathic aseptic necrosis of unspecified femur: Secondary | ICD-10-CM | POA: Diagnosis not present

## 2019-09-21 DIAGNOSIS — K5900 Constipation, unspecified: Secondary | ICD-10-CM | POA: Diagnosis not present

## 2019-09-21 DIAGNOSIS — S93492A Sprain of other ligament of left ankle, initial encounter: Secondary | ICD-10-CM | POA: Diagnosis not present

## 2019-10-19 ENCOUNTER — Other Ambulatory Visit: Payer: Self-pay | Admitting: Neurology

## 2019-10-19 MED ORDER — PHENTERMINE HCL 37.5 MG PO CAPS: 37.5000 mg | ORAL_CAPSULE | ORAL | 5 refills | Status: DC

## 2019-10-19 MED ORDER — FENTANYL 25 MCG/HR TD PT72: 1.0000 | MEDICATED_PATCH | TRANSDERMAL | 0 refills | Status: DC

## 2019-10-19 NOTE — Telephone Encounter (Signed)
Pt called and LVM stating that she will be needing a refill on her phentermine 37.5 MG capsule for today since she is out and her fentaNYL (DURAGESIC) 25 MCG/HR for Friday. [facility] [address] Please advise.

## 2019-10-20 DIAGNOSIS — L2389 Allergic contact dermatitis due to other agents: Secondary | ICD-10-CM | POA: Diagnosis not present

## 2019-10-20 DIAGNOSIS — M87059 Idiopathic aseptic necrosis of unspecified femur: Secondary | ICD-10-CM | POA: Diagnosis not present

## 2019-10-20 DIAGNOSIS — K5900 Constipation, unspecified: Secondary | ICD-10-CM | POA: Diagnosis not present

## 2019-11-12 DIAGNOSIS — M87059 Idiopathic aseptic necrosis of unspecified femur: Secondary | ICD-10-CM | POA: Diagnosis not present

## 2019-11-12 DIAGNOSIS — G35 Multiple sclerosis: Secondary | ICD-10-CM | POA: Diagnosis not present

## 2019-11-12 DIAGNOSIS — K5900 Constipation, unspecified: Secondary | ICD-10-CM | POA: Diagnosis not present

## 2019-11-18 ENCOUNTER — Other Ambulatory Visit: Payer: Self-pay | Admitting: Neurology

## 2019-11-18 MED ORDER — PREGABALIN 150 MG PO CAPS: ORAL_CAPSULE | ORAL | 5 refills | Status: DC

## 2019-11-18 MED ORDER — FENTANYL 25 MCG/HR TD PT72: 1.0000 | MEDICATED_PATCH | TRANSDERMAL | 0 refills | Status: DC

## 2019-11-18 NOTE — Addendum Note (Signed)
Addended by: Arther Abbott on: 11/18/2019 03:32 PM   Modules accepted: Orders

## 2019-11-18 NOTE — Telephone Encounter (Signed)
Pt is requesting a refill for pregabalin (LYRICA) 150 MG capsule, fentaNYL (DURAGESIC) 25 MCG/HR(not needed until Monday) to.  [phone] -

## 2019-11-18 NOTE — Telephone Encounter (Addendum)
Checked drug registry. She last refilled pregabalin 03/17/19 #90 and fentanyl 10/20/19 #[facility]. [phone] Spoke with Hessie Diener, He confirmed pt last refilled Lryica 03/17/19. She was last seen 07/27/19 and next f/u 12/01/19  I spoke with Dr. Epimenio Foot. He would like me to call pt to see if she has been taking pregabalin or not. If not and she wants to go back on this, he will write for 75mg  po BID instead. Does not want to put her back on the higher dose since she has been off of it.   I called pt. She is taking  pregabalin 150mg  po qhs. I placed her on hold. Per Dr. , we will update directions on rx to match how pt is taking. Can still write for #90. I relayed this to the pt. She verbalized understanding.

## 2019-11-22 ENCOUNTER — Ambulatory Visit: Payer: Medicare Other | Admitting: Gastroenterology

## 2019-11-22 ENCOUNTER — Encounter: Payer: Self-pay | Admitting: Gastroenterology

## 2019-11-22 VITALS — BP 130/80 | HR 77 | Ht 65.0 in | Wt 245.0 lb

## 2019-11-22 DIAGNOSIS — K2270 Barrett's esophagus without dysplasia: Secondary | ICD-10-CM | POA: Diagnosis not present

## 2019-11-22 DIAGNOSIS — K581 Irritable bowel syndrome with constipation: Secondary | ICD-10-CM

## 2019-11-22 DIAGNOSIS — K3184 Gastroparesis: Secondary | ICD-10-CM | POA: Diagnosis not present

## 2019-11-22 NOTE — Patient Instructions (Addendum)
If you are age 40 or older, your body mass index should be between 23-30. Your Body mass index is 40.77 kg/m. If this is out of the aforementioned range listed, please consider follow up with your Primary Care Provider.  If you are age 47 or younger, your body mass index should be between 19-25. Your Body mass index is 40.77 kg/m. If this is out of the aformentioned range listed, please consider follow up with your Primary Care Provider.   You have been scheduled for an endoscopy. Please follow written instructions given to you at your visit today. If you use inhalers (even only as needed), please bring them with you on the day of your procedure.  We have sent a referral to nutrition and they will contact you to schedule an appointment   In the meantime, I recommend that you follow a gastroparesis diet: 1. Low residue/low fiber/low fat diet 2. Multiple small meals in a day 5-6, grazing type eating 3. Separate solids and liquids. Drink only sips during meals and hydrate with 4oz of water at a time in between meals.  Thank you for trusting me with your gastrointestinal care!    Tressia Danas, MD, MPH

## 2019-11-22 NOTE — Progress Notes (Signed)
Referring Provider: Norm Salt, PA Primary Care Physician:  Norm Salt, PA  Reason for Consultation:  Barrett's Esophagus, gastroparesis   IMPRESSION:  Reflux with ? Possible Barrett's  Gastroparesis by gastric emptying scan 2014 Constipation-predominant IBS controlled on Linzess GERD previously treated with famotidine and pantoprazole  Reflux with ? Barrett's: EGD with esophageal biopsies recommended to clarify the status of Barrett's esophagus. Continue famotidine 40 mg daily and Carafate 1g QID PRN.   Gastroparesis: Not previously aware of gastroparesis diet. Nutritional consult recommended. Continue metoclopramide.  Consider addition of domperidone if not responding to metoclopramide.  Consider Motegrity given the concurrent constipation.  Constipation-IBS: Continue Linzess. I encouraged her to use treatment daily to maximize control of symptoms.    PLAN: Nutrition consult re: gastroparesis Continue metoclopramide TID taken prior to meals EGD with biopsies Obtain records from Dr. Elnoria Howard  Please see the "Patient Instructions" section for addition details about the plan.  HPI: Joy Cobb is a 39 y.o. female referred by PA [facility]. The history is obtained through the patient, records that she brings, and review of her electronic health records. She has MS diagnosed in 2012 after presenting with optic neuritis and chronic pain, chronic fatigue, allergic rhinitis, vitamin D deficiency, REM behavioral disorder. 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.   She has previously been seen by Dr. Elnoria Howard and Dr. Cloretta Ned. Dr. Elnoria Howard diagnosed gastroparesis 2014 with abnormal gastric emptying scan 2017. GES showed 87% activity at 60 minutes, 39% at 120 minutes. She has been on metaclopromide since that time. No other treatments or interventions.  EGD with Dr. Cloretta Ned 06/19/18 for nausea, vomiting, and history GERD showed irregular z-line, hemorrhagic  gastritis, small hiatal hernia, normal duodenum. I reviewed the Gastric biopsies showed normal small bowel, reactive gastritis, negative for H pylori, and reflux esophagitis with rare goblet cells.   Abdominal ultrasound 06/18/18 was normal.  Treated with famotidine 40 mg daily, sucralfate 1g QOD, Reglan, and she was started on pantoprazole. Told that she had possible Barrett's and that she should have an EGD in 2021. Wants to proceed with repeat endoscopy.  Feels "warm" in the posterior oropharynx over the last few weeks. No prior history of symptoms. Chewable antacids provided some relief.   Longstanding treatment with Linzess for IBS with control of both  constipation and bloating. She is using Linzess PRN to control her symptoms over the last 2-3 months because she wanted to be sure that she could still have a bowel movement on her own. When using Linzess she has one bowel movement daily.  Continues to have ongoing bloating 24/7 with ongoing early satiety when not taking Linzess.   She discovered FODMAP diets on the internet but has not yet tried it.  No prior colonoscopy.   History of GERD treated with famotidine and pantoprazole in the past.   Mother with GERD with prior esophageal dilation. Three of her mother's first cousins had colon cancer. Mother and multiple aunts with colon cancer. No known family history of colon cancer or polyps. No family history of uterine/endometrial cancer, pancreatic cancer or gastric/stomach cancer.   Past Medical History:  Diagnosis Date  . Bladder spasms   . Colonic inertia   . IBS (irritable bowel syndrome)   . Insomnia   . Multiple sclerosis (HCC)   . Neuropathy   . Renal disorder   . Vision abnormalities     Past Surgical History:  Procedure Laterality Date  . BREAST SURGERY    .  CYSTECTOMY    . polyp removal      Current Outpatient Medications  Medication Sig Dispense Refill  . baclofen (LIORESAL) 10 MG tablet Take 1 tablet (10 mg  total) by mouth 3 (three) times daily. 270 tablet 3  . cetirizine (ZYRTEC) 10 MG tablet Take 10 mg by mouth daily.    . drospirenone-ethinyl estradiol (YAZ,GIANVI,LORYNA) 3-0.02 MG tablet Take 1 tablet by mouth daily.    . famotidine (PEPCID) 40 MG tablet Take 40 mg by mouth daily.     . fentaNYL (DURAGESIC) 25 MCG/HR Place 1 patch onto the skin every 3 (three) days. 10 patch 0  . FLUoxetine (PROZAC) 40 MG capsule     . linaclotide (LINZESS) 290 MCG CAPS capsule Take 290 mcg by mouth daily before breakfast.    . meloxicam (MOBIC) 15 MG tablet Take 15 mg by mouth daily as needed (For hip pain flare ups).   2  . metoCLOPramide (REGLAN) 5 MG tablet Take 10 mg by mouth in the morning and at bedtime.     . phentermine 37.5 MG capsule Take 1 capsule (37.5 mg total) by mouth every morning. 30 capsule 5  . pregabalin (LYRICA) 150 MG capsule TAKE ONE CAPSULE BY MOUTH AT BEDTIME 90 capsule 5  . sucralfate (CARAFATE) 1 GM/10ML suspension     . TECFIDERA 240 MG CPDR TAKE 1 CAPSULE (240MG ) BY  MOUTH TWICE DAILY 180 capsule 1  . traZODone (DESYREL) 50 MG tablet Take 100 mg by mouth at bedtime.    . Vitamin D, Ergocalciferol, (DRISDOL) 50000 units CAPS capsule Take 50,000 Units by mouth 2 (two) times a week.     albuterol (VENTOLIN HFA) 108 (90 Base) MCG/ACT inhaler Inhale 2 puffs into the lungs every 6 (six) hours as needed for wheezing or shortness of breath. (Patient not taking: Reported on 11/22/2019) 2 each 2   No current facility-administered medications for this visit.    Allergies as of 11/22/2019 - Review Complete 11/22/2019  Allergen Reaction Noted  . Amoxicillin  02/14/2012  . Penicillins Hives 02/05/2012  . Temazepam  02/14/2012  . Septra [sulfamethoxazole-trimethoprim] Rash 02/05/2012    Family History  Problem Relation Age of Onset  . Neuropathy Mother   . Allergic rhinitis Mother   . Urticaria Mother   . Cancer - Other Father   . Diabetes Maternal Grandmother   . Hypertension  Maternal Grandmother   . Kidney failure Maternal Grandmother   . Parkinson's disease Maternal Grandmother   . Thyroid disease Maternal Aunt   . Breast cancer Maternal Aunt   . Parkinson's disease Maternal Aunt   . Asthma Neg Hx   . Eczema Neg Hx     Social History   Socioeconomic History  . Marital status: Single    Spouse name: Not on file  . Number of children: Not on file  . Years of education: Not on file  . Highest education level: Not on file  Occupational History  . Not on file  Tobacco Use  . Smoking status: Never Smoker  . Smokeless tobacco: Never Used  Vaping Use  . Vaping Use: Never used  Substance and Sexual Activity  . Alcohol use: Yes  . Drug use: No  . Sexual activity: Not Currently  Other Topics Concern  . Not on file  Social History Narrative  . Not on file   Social Determinants of Health   Financial Resource Strain:   . Difficulty of Paying Living Expenses: Not on file  Food Insecurity:   . Worried About Programme researcher, broadcasting/film/video in the Last Year: Not on file  . Ran Out of Food in the Last Year: Not on file  Transportation Needs:   . Lack of Transportation (Medical): Not on file  . Lack of Transportation (Non-Medical): Not on file  Physical Activity:   . Days of Exercise per Week: Not on file  . Minutes of Exercise per Session: Not on file  Stress:   . Feeling of Stress : Not on file  Social Connections:   . Frequency of Communication with Friends and Family: Not on file  . Frequency of Social Gatherings with Friends and Family: Not on file  . Attends Religious Services: Not on file  . Active Member of Clubs or Organizations: Not on file  . Attends Banker Meetings: Not on file  . Marital Status: Not on file  Intimate Partner Violence:   . Fear of Current or Ex-Partner: Not on file  . Emotionally Abused: Not on file  . Physically Abused: Not on file  . Sexually Abused: Not on file    Review of Systems: 12 system ROS is negative  except as noted above with allergies, back pain, vision changes, cough, fatigue, night sweats, lymph nodes, and excessive urination.   Physical Exam: General:   Alert,  well-nourished, pleasant and cooperative in NAD Head:  Normocephalic and atraumatic. Eyes:  Sclera clear, no icterus.   Conjunctiva pink. Ears:  Normal auditory acuity. Nose:  No deformity, discharge,  or lesions. Mouth:  No deformity or lesions.   Neck:  Supple; no masses or thyromegaly. Lungs:  Clear throughout to auscultation.   No wheezes. Heart:  Regular rate and rhythm; no murmurs. Abdomen:  Soft, central obesity, nontender although she points to the epigastrium as the location of pain when it occurs, nondistended, normal bowel sounds, no rebound or guarding. No hepatosplenomegaly.  Msk:  Symmetrical. No boney deformities LAD: No inguinal or umbilical LAD Extremities:  No clubbing or edema. Neurologic:  Alert and  oriented x4;  grossly nonfocal Skin:  Intact without significant lesions or rashes. Psych:  Alert and cooperative. Normal mood and affect.    Joy Cobb L. Orvan Falconer, MD, MPH 11/22/2019, 2:42 PM

## 2019-11-25 ENCOUNTER — Ambulatory Visit: Payer: Medicare Other | Admitting: Psychology

## 2019-12-01 ENCOUNTER — Ambulatory Visit: Payer: Self-pay | Admitting: Neurology

## 2019-12-17 DIAGNOSIS — Z136 Encounter for screening for cardiovascular disorders: Secondary | ICD-10-CM | POA: Diagnosis not present

## 2019-12-17 DIAGNOSIS — Z131 Encounter for screening for diabetes mellitus: Secondary | ICD-10-CM | POA: Diagnosis not present

## 2019-12-17 DIAGNOSIS — R059 Cough, unspecified: Secondary | ICD-10-CM | POA: Diagnosis not present

## 2019-12-17 DIAGNOSIS — Z1329 Encounter for screening for other suspected endocrine disorder: Secondary | ICD-10-CM | POA: Diagnosis not present

## 2019-12-17 DIAGNOSIS — Z0001 Encounter for general adult medical examination with abnormal findings: Secondary | ICD-10-CM | POA: Diagnosis not present

## 2019-12-21 ENCOUNTER — Telehealth: Payer: Self-pay | Admitting: Gastroenterology

## 2019-12-21 ENCOUNTER — Other Ambulatory Visit: Payer: Self-pay | Admitting: Neurology

## 2019-12-21 MED ORDER — FENTANYL 25 MCG/HR TD PT72: 1.0000 | MEDICATED_PATCH | TRANSDERMAL | 0 refills | Status: DC

## 2019-12-21 NOTE — Telephone Encounter (Signed)
Spoke with pt and she is aware.

## 2019-12-21 NOTE — Telephone Encounter (Signed)
Pt states she was seen by her PCP last week for bronchitis which has gotten better with medication, pt would like to know how this would affect her endoscopy scheduled for Friday.

## 2019-12-21 NOTE — Telephone Encounter (Signed)
Pt states she is to have a procedure on Friday. Last week she was seen for bronchitis and placed on an antibiotic. She has completed the treatment and is feeling better. She states she still has a little cough and stuffy nose, no fever. She is coughing up some clear mucous. Pt wants to make sure she is still ok to proceed with the EGD. Please advise.

## 2019-12-21 NOTE — Telephone Encounter (Signed)
Pt is requesting a refill for fentaNYL (DURAGESIC) 25 MCG/HR .  Pharmacy: [id number]   

## 2019-12-21 NOTE — Telephone Encounter (Signed)
As long as she is feeling better, this should not be a problem. Thank you.

## 2019-12-21 NOTE — Addendum Note (Signed)
Addended byYetta Barre, Kara Mead L on: 12/21/2019 12:00 PM   Modules accepted: Orders

## 2019-12-22 ENCOUNTER — Encounter: Payer: Self-pay | Admitting: Gastroenterology

## 2019-12-24 ENCOUNTER — Encounter: Payer: Self-pay | Admitting: Gastroenterology

## 2019-12-24 ENCOUNTER — Other Ambulatory Visit: Payer: Self-pay

## 2019-12-24 ENCOUNTER — Ambulatory Visit (AMBULATORY_SURGERY_CENTER): Payer: Medicare Other | Admitting: Gastroenterology

## 2019-12-24 VITALS — BP 146/97 | HR 76 | Temp 97.5°F | Resp 25 | Ht 65.0 in | Wt 245.0 lb

## 2019-12-24 DIAGNOSIS — K3184 Gastroparesis: Secondary | ICD-10-CM

## 2019-12-24 DIAGNOSIS — K208 Other esophagitis without bleeding: Secondary | ICD-10-CM | POA: Diagnosis not present

## 2019-12-24 DIAGNOSIS — K2270 Barrett's esophagus without dysplasia: Secondary | ICD-10-CM | POA: Diagnosis not present

## 2019-12-24 DIAGNOSIS — K2970 Gastritis, unspecified, without bleeding: Secondary | ICD-10-CM | POA: Diagnosis not present

## 2019-12-24 DIAGNOSIS — K3189 Other diseases of stomach and duodenum: Secondary | ICD-10-CM

## 2019-12-24 DIAGNOSIS — K6389 Other specified diseases of intestine: Secondary | ICD-10-CM | POA: Diagnosis not present

## 2019-12-24 MED ORDER — SODIUM CHLORIDE 0.9 % IV SOLN: 500.0000 mL | Freq: Once | INTRAVENOUS | Status: DC

## 2019-12-24 NOTE — Progress Notes (Signed)
Called to room to assist during endoscopic procedure.  Patient ID and intended procedure confirmed with present staff. Received instructions for my participation in the procedure from the performing physician.  

## 2019-12-24 NOTE — Op Note (Signed)
[facility] Patient Name: Daylynn Stumpp Procedure Date: 12/24/2019 2:57 PM MRN: [id number] Endoscopist: Tressia Danas MD, MD Age: 39 Referring MD:  Date of Birth: 04-26-1980 Gender: Female Account #: [id number] Procedure:                Upper GI endoscopy Indications:              Reflux with ? Possible Barrett's                           Gastroparesis by gastric emptying scan 2014                           GERD previously treated with famotidine and                            pantoprazole Medicines:                Monitored Anesthesia Care Procedure:                Pre-Anesthesia Assessment:                           - Prior to the procedure, a History and Physical                            was performed, and patient medications and                            allergies were reviewed. The patient's tolerance of                            previous anesthesia was also reviewed. The risks                            and benefits of the procedure and the sedation                            options and risks were discussed with the patient.                            All questions were answered, and informed consent                            was obtained. Prior Anticoagulants: The patient has                            taken no previous anticoagulant or antiplatelet                            agents. ASA Grade Assessment: II - A patient with                            mild systemic disease. After reviewing the risks  and benefits, the patient was deemed in                            satisfactory condition to undergo the procedure.                           After obtaining informed consent, the endoscope was                            passed under direct vision. Throughout the                            procedure, the patient's blood pressure, pulse, and                            oxygen saturations were monitored continuously. The                             Endoscope was introduced through the mouth, and                            advanced to the second part of duodenum. The upper                            GI endoscopy was accomplished without difficulty.                            The patient tolerated the procedure well. Scope In: Scope Out: Findings:                 The examined esophagus was normal. Biopsies were                            taken from the mid/proximal and distal esophagus                            with a cold forceps for histology. Estimated blood                            loss was minimal.                           Food was found in the gastric fundus and in the                            gastric body.                           The entire examined stomach was normal. Biopsies                            were taken from the antrum, body, and fundus with a                            cold  forceps for histology. Estimated blood loss                            was minimal.                           The examined duodenum was normal. Biopsies were                            taken with a cold forceps for histology. Estimated                            blood loss was minimal. Complications:            No immediate complications. Estimated blood loss:                            Minimal. Estimated Blood Loss:     Estimated blood loss was minimal. Impression:               - Normal esophagus. Biopsied.                           - A small amount of food (residue) in the stomach.                           - Normal stomach. Biopsied.                           - Normal examined duodenum. Biopsied. Recommendation:           - Patient has a contact number available for                            emergencies. The signs and symptoms of potential                            delayed complications were discussed with the                            patient. Return to normal activities tomorrow.                            Written  discharge instructions were provided to the                            patient.                           - Resume previous diet.                           - Continue present medications.                           - Await pathology results. Tressia Danas MD, MD 12/24/2019 3:31:38 PM This report has been signed electronically.

## 2019-12-24 NOTE — Progress Notes (Signed)
To PACU, VSS. Report to [email or web address] 

## 2019-12-24 NOTE — Progress Notes (Signed)
VS by CW. ?

## 2019-12-24 NOTE — Patient Instructions (Signed)
YOU HAD AN ENDOSCOPIC PROCEDURE TODAY AT THE [facility]:   Refer to the procedure report that was given to you for any specific questions about what was found during the examination.  If the procedure report does not answer your questions, please call your gastroenterologist to clarify.  If you requested that your care partner not be given the details of your procedure findings, then the procedure report has been included in a sealed envelope for you to review at your convenience later.  YOU SHOULD EXPECT: Some feelings of bloating in the abdomen. Passage of more gas than usual.  Walking can help get rid of the air that was put into your GI tract during the procedure and reduce the bloating. If you had a lower endoscopy (such as a colonoscopy or flexible sigmoidoscopy) you may notice spotting of blood in your stool or on the toilet paper. If you underwent a bowel prep for your procedure, you may not have a normal bowel movement for a few days.  Please Note:  You might notice some irritation and congestion in your nose or some drainage.  This is from the oxygen used during your procedure.  There is no need for concern and it should clear up in a day or so.  SYMPTOMS TO REPORT IMMEDIATELY:    Following upper endoscopy (EGD)  Vomiting of blood or coffee ground material  New chest pain or pain under the shoulder blades  Painful or persistently difficult swallowing  New shortness of breath  Fever of 100F or higher  Black, tarry-looking stools  For urgent or emergent issues, a gastroenterologist can be reached at any hour by calling [phone]. Do not use MyChart messaging for urgent concerns.    DIET:  We do recommend a small meal at first, but then you may proceed to your regular diet.  Drink plenty of fluids but you should avoid alcoholic beverages for 24 hours.  ACTIVITY:  You should plan to take it easy for the rest of today and you should NOT DRIVE or use heavy machinery  until tomorrow (because of the sedation medicines used during the test).    FOLLOW UP: Our staff will call the number listed on your records 48-72 hours following your procedure to check on you and address any questions or concerns that you may have regarding the information given to you following your procedure. If we do not reach you, we will leave a message.  We will attempt to reach you two times.  During this call, we will ask if you have developed any symptoms of COVID 19. If you develop any symptoms (ie: fever, flu-like symptoms, shortness of breath, cough etc.) before then, please call [phone].  If you test positive for Covid 19 in the 2 weeks post procedure, please call and report this information to us.    If any biopsies were taken you will be contacted by phone or by letter within the next 1-3 weeks.  Please call us at [phone] if you have not heard about the biopsies in 3 weeks.    SIGNATURES/CONFIDENTIALITY: You and/or your care partner have signed paperwork which will be entered into your electronic medical record.  These signatures attest to the fact that that the information above on your After Visit Summary has been reviewed and is understood.  Full responsibility of the confidentiality of this discharge information lies with you and/or your care-partner. 

## 2019-12-28 ENCOUNTER — Other Ambulatory Visit: Payer: Self-pay

## 2019-12-28 ENCOUNTER — Telehealth: Payer: Self-pay | Admitting: *Deleted

## 2019-12-28 ENCOUNTER — Encounter: Payer: Self-pay | Admitting: Neurology

## 2019-12-28 ENCOUNTER — Ambulatory Visit: Payer: Medicare Other | Admitting: Neurology

## 2019-12-28 ENCOUNTER — Telehealth: Payer: Self-pay

## 2019-12-28 VITALS — BP 143/76 | HR 77 | Ht 65.0 in | Wt 245.0 lb

## 2019-12-28 DIAGNOSIS — Z79899 Other long term (current) drug therapy: Secondary | ICD-10-CM

## 2019-12-28 DIAGNOSIS — Z79891 Long term (current) use of opiate analgesic: Secondary | ICD-10-CM | POA: Diagnosis not present

## 2019-12-28 DIAGNOSIS — R208 Other disturbances of skin sensation: Secondary | ICD-10-CM | POA: Diagnosis not present

## 2019-12-28 DIAGNOSIS — G35 Multiple sclerosis: Secondary | ICD-10-CM

## 2019-12-28 DIAGNOSIS — G4752 REM sleep behavior disorder: Secondary | ICD-10-CM | POA: Diagnosis not present

## 2019-12-28 DIAGNOSIS — G894 Chronic pain syndrome: Secondary | ICD-10-CM | POA: Insufficient documentation

## 2019-12-28 NOTE — Telephone Encounter (Signed)
First post procedure follow up call, no answer 

## 2019-12-28 NOTE — Telephone Encounter (Signed)
  Follow up Call-  Call back number 12/24/2019  Post procedure Call Back phone  # [phone]  Permission to leave phone message Yes  Some recent data might be hidden     Patient questions:  Do you have a fever, pain , or abdominal swelling? No. Pain Score  0 *  Have you tolerated food without any problems? Yes.    Have you been able to return to your normal activities? Yes.    Do you have any questions about your discharge instructions: Diet   No. Medications  No. Follow up visit   No.  Do you have questions or concerns about your Care? No.  Actions: * If pain score is 4 or above: No action needed, pain <4.  1. Have you developed a fever since your procedure? no  2.   Have you had an respiratory symptoms (SOB or cough) since your procedure? no  3.   Have you tested positive for COVID 19 since your procedure no  4.   Have you had any family members/close contacts diagnosed with the COVID 19 since your procedure?  no   If yes to any of these questions please route to Laverna Peace, RN and Karlton Lemon, RN
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
## 2019-12-29 LAB — CBC WITH DIFFERENTIAL/PLATELET
Basophils Absolute: 0 10*3/uL (ref 0.0–0.2)
Basos: 0 %
EOS (ABSOLUTE): 0 10*3/uL (ref 0.0–0.4)
Eos: 1 %
Hematocrit: 34.6 % (ref 34.0–46.6)
Hemoglobin: 11.9 g/dL (ref 11.1–15.9)
Immature Grans (Abs): 0 10*3/uL (ref 0.0–0.1)
Immature Granulocytes: 0 %
Lymphocytes Absolute: 2.4 10*3/uL (ref 0.7–3.1)
Lymphs: 37 %
MCH: 29.1 pg (ref 26.6–33.0)
MCHC: 34.4 g/dL (ref 31.5–35.7)
MCV: 85 fL (ref 79–97)
Monocytes Absolute: 0.5 10*3/uL (ref 0.1–0.9)
Monocytes: 8 %
Neutrophils Absolute: 3.5 10*3/uL (ref 1.4–7.0)
Neutrophils: 54 %
Platelets: 263 10*3/uL (ref 150–450)
RBC: 4.09 x10E6/uL (ref 3.77–5.28)
RDW: 13.6 % (ref 11.7–15.4)
WBC: 6.5 10*3/uL (ref 3.4–10.8)

## 2020-01-20 ENCOUNTER — Other Ambulatory Visit: Payer: Self-pay | Admitting: Neurology

## 2020-01-20 MED ORDER — FENTANYL 25 MCG/HR TD PT72: 1.0000 | MEDICATED_PATCH | TRANSDERMAL | 0 refills | Status: DC

## 2020-01-20 NOTE — Telephone Encounter (Signed)
Pt is needing a refill on her fentaNYL (DURAGESIC) 25 MCG/HR [facility] [address]  

## 2020-02-09 ENCOUNTER — Telehealth: Payer: Self-pay | Admitting: Neurology

## 2020-02-09 DIAGNOSIS — G35 Multiple sclerosis: Secondary | ICD-10-CM

## 2020-02-09 MED ORDER — TECFIDERA 240 MG PO CPDR: DELAYED_RELEASE_CAPSULE | ORAL | 3 refills | Status: DC

## 2020-02-09 NOTE — Telephone Encounter (Signed)
Pt request refill TECFIDERA 240 MG CPDR at [facility].   Pt insurance info: [employer] Dual Complete Member ID: [id number] Group ID: [id number] BIN: [id number] [id number] Phone: [phone] Customer Service: [phone]

## 2020-02-09 NOTE — Telephone Encounter (Signed)
E-scribed refill as requested. 

## 2020-02-17 DIAGNOSIS — Z1152 Encounter for screening for COVID-19: Secondary | ICD-10-CM | POA: Diagnosis not present

## 2020-02-18 ENCOUNTER — Other Ambulatory Visit: Payer: Self-pay | Admitting: Neurology

## 2020-02-18 NOTE — Telephone Encounter (Signed)
Pt request refill fentaNYL (DURAGESIC) 25 MCG/HR [id number]  

## 2020-02-21 MED ORDER — FENTANYL 25 MCG/HR TD PT72: 1.0000 | MEDICATED_PATCH | TRANSDERMAL | 0 refills | Status: DC

## 2020-02-28 ENCOUNTER — Telehealth: Payer: Self-pay | Admitting: Neurology

## 2020-02-28 NOTE — Telephone Encounter (Signed)
Reviewed pt chart. Rx last sent 11/18/19 #90 with 5 refills. There should be refills on file at the pharmacy for her.

## 2020-02-28 NOTE — Telephone Encounter (Signed)
Pt has called for a refill on her pregabalin (LYRICA) 150 MG capsule to [id number] Pt new insurance info: [employer] Complete Member [id number] [phone] RX [id number] PCN [phone] RX Group#[id number]

## 2020-02-28 NOTE — Telephone Encounter (Signed)
Called pt and relayed below message. She verbalized understanding and will contact the pharmacy for refill.

## 2020-03-23 ENCOUNTER — Other Ambulatory Visit: Payer: Self-pay | Admitting: Neurology

## 2020-03-23 MED ORDER — FENTANYL 25 MCG/HR TD PT72: 1.0000 | MEDICATED_PATCH | TRANSDERMAL | 0 refills | Status: DC

## 2020-03-23 NOTE — Telephone Encounter (Signed)
Pt request refill fentaNYL (DURAGESIC) 25 MCG[facility]HR [facility] [phone]

## 2020-04-21 DIAGNOSIS — R946 Abnormal results of thyroid function studies: Secondary | ICD-10-CM | POA: Diagnosis not present

## 2020-04-21 DIAGNOSIS — J301 Allergic rhinitis due to pollen: Secondary | ICD-10-CM | POA: Diagnosis not present

## 2020-04-21 DIAGNOSIS — K5900 Constipation, unspecified: Secondary | ICD-10-CM | POA: Diagnosis not present

## 2020-04-21 DIAGNOSIS — G894 Chronic pain syndrome: Secondary | ICD-10-CM | POA: Diagnosis not present

## 2020-04-21 DIAGNOSIS — N3289 Other specified disorders of bladder: Secondary | ICD-10-CM | POA: Diagnosis not present

## 2020-04-21 DIAGNOSIS — K3184 Gastroparesis: Secondary | ICD-10-CM | POA: Diagnosis not present

## 2020-04-21 DIAGNOSIS — G35 Multiple sclerosis: Secondary | ICD-10-CM | POA: Diagnosis not present

## 2020-04-21 DIAGNOSIS — E559 Vitamin D deficiency, unspecified: Secondary | ICD-10-CM | POA: Diagnosis not present

## 2020-04-21 DIAGNOSIS — M87059 Idiopathic aseptic necrosis of unspecified femur: Secondary | ICD-10-CM | POA: Diagnosis not present

## 2020-04-21 DIAGNOSIS — J302 Other seasonal allergic rhinitis: Secondary | ICD-10-CM | POA: Diagnosis not present

## 2020-04-21 DIAGNOSIS — K219 Gastro-esophageal reflux disease without esophagitis: Secondary | ICD-10-CM | POA: Diagnosis not present

## 2020-04-21 DIAGNOSIS — R7309 Other abnormal glucose: Secondary | ICD-10-CM | POA: Diagnosis not present

## 2020-04-25 ENCOUNTER — Ambulatory Visit: Payer: Medicare Other | Admitting: Family Medicine

## 2020-04-27 ENCOUNTER — Other Ambulatory Visit: Payer: Self-pay | Admitting: Neurology

## 2020-04-27 MED ORDER — FENTANYL 25 MCG/HR TD PT72: 1.0000 | MEDICATED_PATCH | TRANSDERMAL | 0 refills | Status: DC

## 2020-04-27 NOTE — Telephone Encounter (Signed)
Pt request refill fentaNYL (DURAGESIC) 25 MCG[facility]HR [facility] [phone]

## 2020-05-15 ENCOUNTER — Telehealth: Payer: Self-pay | Admitting: Neurology

## 2020-05-15 NOTE — Telephone Encounter (Signed)
Called pt back. She is in a lot of pain. Started yesterday in her back and has progressed to her left leg. Feels like her leg is going to fall off. She has a dull achy pain. Advised she should proceed to urgent care or ED for immediate evaluation. She will do this and follow up with our office next week on 05/25/20.

## 2020-05-15 NOTE — Telephone Encounter (Signed)
Received call from patient while covering for phone room. Patient was very tearful. She stated she is in a lot of pain, and stated this is an emergency. She is wanting to see Dr. Epimenio Foot.

## 2020-05-16 DIAGNOSIS — G35 Multiple sclerosis: Secondary | ICD-10-CM | POA: Diagnosis not present

## 2020-05-16 DIAGNOSIS — J301 Allergic rhinitis due to pollen: Secondary | ICD-10-CM | POA: Diagnosis not present

## 2020-05-16 DIAGNOSIS — E559 Vitamin D deficiency, unspecified: Secondary | ICD-10-CM | POA: Diagnosis not present

## 2020-05-16 DIAGNOSIS — N3289 Other specified disorders of bladder: Secondary | ICD-10-CM | POA: Diagnosis not present

## 2020-05-16 DIAGNOSIS — K219 Gastro-esophageal reflux disease without esophagitis: Secondary | ICD-10-CM | POA: Diagnosis not present

## 2020-05-16 DIAGNOSIS — K3184 Gastroparesis: Secondary | ICD-10-CM | POA: Diagnosis not present

## 2020-05-16 DIAGNOSIS — M25561 Pain in right knee: Secondary | ICD-10-CM | POA: Diagnosis not present

## 2020-05-16 DIAGNOSIS — J302 Other seasonal allergic rhinitis: Secondary | ICD-10-CM | POA: Diagnosis not present

## 2020-05-16 DIAGNOSIS — K5900 Constipation, unspecified: Secondary | ICD-10-CM | POA: Diagnosis not present

## 2020-05-16 DIAGNOSIS — M87059 Idiopathic aseptic necrosis of unspecified femur: Secondary | ICD-10-CM | POA: Diagnosis not present

## 2020-05-25 ENCOUNTER — Other Ambulatory Visit: Payer: Self-pay

## 2020-05-25 ENCOUNTER — Ambulatory Visit (INDEPENDENT_AMBULATORY_CARE_PROVIDER_SITE_OTHER): Payer: Medicare Other | Admitting: Family Medicine

## 2020-05-25 ENCOUNTER — Encounter: Payer: Self-pay | Admitting: Family Medicine

## 2020-05-25 VITALS — BP 152/93 | HR 77 | Ht 65.0 in | Wt 255.0 lb

## 2020-05-25 DIAGNOSIS — E559 Vitamin D deficiency, unspecified: Secondary | ICD-10-CM

## 2020-05-25 DIAGNOSIS — G894 Chronic pain syndrome: Secondary | ICD-10-CM

## 2020-05-25 DIAGNOSIS — G4752 REM sleep behavior disorder: Secondary | ICD-10-CM

## 2020-05-25 DIAGNOSIS — G35 Multiple sclerosis: Secondary | ICD-10-CM

## 2020-05-25 DIAGNOSIS — Z79899 Other long term (current) drug therapy: Secondary | ICD-10-CM | POA: Diagnosis not present

## 2020-05-25 DIAGNOSIS — R208 Other disturbances of skin sensation: Secondary | ICD-10-CM

## 2020-05-25 DIAGNOSIS — Z79891 Long term (current) use of opiate analgesic: Secondary | ICD-10-CM | POA: Diagnosis not present

## 2020-05-25 MED ORDER — FENTANYL 25 MCG/HR TD PT72: 1.0000 | MEDICATED_PATCH | TRANSDERMAL | 0 refills | Status: DC

## 2020-05-25 MED ORDER — PREGABALIN 150 MG PO CAPS: ORAL_CAPSULE | ORAL | 1 refills | Status: DC

## 2020-05-25 MED ORDER — BACLOFEN 10 MG PO TABS: 10.0000 mg | ORAL_TABLET | Freq: Three times a day (TID) | ORAL | 3 refills | Status: DC

## 2020-05-25 NOTE — Patient Instructions (Signed)
Below is our plan:  We will continue current treatment plan. Send me a copy of your labs when you get them. Follow up closely with your PCP regarding left knee pain. Continue meloxicam and keep elevated.   Please make sure you are staying well hydrated. I recommend 50-60 ounces daily. Well balanced diet and regular exercise encouraged. Consistent sleep schedule with 6-8 hours recommended.   Please continue follow up with care team as directed.   Follow up with Dr Epimenio Foot in 6 months   You may receive a survey regarding today's visit. I encourage you to leave honest feed back as I do use this information to improve patient care. Thank you for seeing me today!      Multiple Sclerosis Multiple sclerosis (MS) is a disease of the brain, spinal cord, and optic nerves (central nervous system). It causes the body's disease-fighting (immune) system to destroy the protective covering (myelin sheath) around nerves in the brain. When this happens, signals (nerve impulses) going to and from the brain and spinal cord do not get sent properly or may not get sent at all. There are several types of MS:  Relapsing-remitting MS. This is the most common type. This causes sudden attacks of symptoms. After an attack, you may recover completely until the next attack, or some symptoms may remain permanently.  Secondary progressive MS. This usually develops after the onset of relapsing-remitting MS. Similar to relapsing-remitting MS, this type also causes sudden attacks of symptoms. Attacks may be less frequent, but symptoms slowly get worse (progress) over time.  Primary progressive MS. This causes symptoms that steadily progress over time. This type of MS does not cause sudden attacks of symptoms. The age of onset of MS varies, but it often develops between 65-76 years of age. MS is a lifelong (chronic) condition. There is no cure, but treatment can help slow down the progression of the disease. What are the  causes? The cause of this condition is not known. What increases the risk? You are more likely to develop this condition if:  You are a woman.  You have a relative with MS. However, the condition is not passed from parent to child (inherited).  You have a lack (deficiency) of vitamin D.  You smoke. MS is more common in the [place] than in the [place]. What are the signs or symptoms? Relapsing-remitting and secondary progressive MS cause symptoms to occur in episodes or attacks that may last weeks to months. There may be long periods between attacks in which there are almost no symptoms. Primary progressive MS causes symptoms to steadily progress after they develop. Symptoms of MS vary because of the many different ways it affects the central nervous system. The main symptoms include:  Vision problems and eye pain.  Numbness and weakness.  Inability to move your arms, hands, feet, or legs (paralysis).  Balance problems.  Shaking that you cannot control (tremors).  Muscle spasms.  Problems with thinking (cognitive changes). MS can also cause symptoms that are associated with the disease, but are not always the direct result of an MS attack. They may include:  Inability to control urination or bowel movements (incontinence).  Headaches.  Fatigue.  Inability to tolerate heat.  Emotional changes.  Depression.  Pain. How is this diagnosed? This condition is diagnosed based on:  Your symptoms.  A neurological exam. This involves checking central nervous system function, such as nerve function, reflexes, and coordination.  MRIs of the brain and  spinal cord.  Lab tests, including a lumbar puncture that tests the fluid that surrounds the brain and spinal cord (cerebrospinal fluid).  Tests to measure the electrical activity of the brain in response to stimulation (evoked potentials). How is this treated? There is no cure for MS, but  medicines can help decrease the number and frequency of attacks and help relieve nuisance symptoms. Treatment options may include:  Medicines that reduce the frequency of attacks. These medicines may be given by injection, by mouth (orally), or through an IV.  Medicines that reduce inflammation (steroids). These may provide short-term relief of symptoms.  Medicines to help control pain, depression, fatigue, or incontinence.  Nutritional counseling. Vitamin D supplements, if you have a deficiency.  Using devices to help you move around (assistive devices), such as braces, a cane, or a walker.  Physical therapy to strengthen and stretch your muscles.  Occupational therapy to help you with everyday tasks.  Alternative or complementary treatments such as exercise, massage, or acupuncture.   Follow these instructions at home:  Take over-the-counter and prescription medicines only as told by your health care provider.  Do not drive or use heavy machinery while taking prescription pain medicine.  Use assistive devices as recommended by your physical therapist or your health care provider.  Exercise as directed by your health care provider.  Eating healthy can help manage MS symptoms.  Return to your normal activities as told by your health care provider. Ask your health care provider what activities are safe for you.  Reach out for support. Share your feelings with friends, family, or a support group.  Keep all follow-up visits as told by your health care provider and therapists. This is important. Where to find more information  National Multiple Sclerosis Society: https://www.nationalmssociety.org  [employer] of Neurological Disorders and Stroke: https://johnson-smith.net/  [employer] for Complementary and Integrative Health: http://miller-hamilton.net/ Contact a health care provider if:  You feel depressed.  You develop new pain or numbness.  You have  tremors.  You have problems with sexual function. Get help right away if:  You develop paralysis.  You develop numbness.  You have problems with your bladder or bowel function.  You develop double vision.  You lose vision in one or both eyes.  You develop suicidal thoughts.  You develop severe confusion. If you ever feel like you may hurt yourself or others, or have thoughts about taking your own life, get help right away. You can go to your nearest emergency department or call:  Your local emergency services (911 in the [place]  A suicide crisis helpline, such as the National Suicide Prevention Lifeline at (817) 747-4295. This is open 24 hours a day. Summary  Multiple sclerosis (MS) is a disease of the central nervous system that causes the body's immune system to destroy the protective covering (myelin sheath) around nerves in the brain.  There are 3 types of MS: relapsing-remitting, secondary progressive, and primary progressive. Relapsing-remitting and secondary progressive MS cause symptoms to occur in episodes or attacks that may last weeks to months. Primary progressive MS causes symptoms to steadily progress after they develop.  There is no cure for MS, but medicines can help decrease the number and frequency of attacks and help relieve nuisance symptoms. Treatment may also include physical or occupational therapy.  If you develop numbness, paralysis, vision problems, or other neurological symptoms, get help right away. This information is not intended to replace advice given to you by your health care provider. Make  sure you discuss any questions you have with your health care provider. Document Revised: 11/02/2019 Document Reviewed: 11/02/2019 Elsevier Patient Education  2021 [employer].
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
## 2020-05-26 LAB — VITAMIN D 25 HYDROXY (VIT D DEFICIENCY, FRACTURES): Vit D, 25-Hydroxy: 36.5 ng/mL (ref 30.0–100.0)

## 2020-05-28 NOTE — Progress Notes (Signed)
I have read the note, and I agree with the clinical assessment and plan.  Jeret Goyer A. Kiasha Bellin, MD, PhD, FAAN Certified in Neurology, Clinical Neurophysiology, Sleep Medicine, Pain Medicine and Neuroimaging  [facility] [address], [address] [place], [place] [address] [phone]  

## 2020-05-29 ENCOUNTER — Telehealth: Payer: Self-pay | Admitting: *Deleted

## 2020-05-29 NOTE — Telephone Encounter (Signed)
-----   Message from Shawnie Dapper, NP sent at 05/29/2020 11:14 AM EDT ----- Her vitamin D looks good. Can you confirm dose she is taking. She told me she was taking rx of 50,000iu but has not taken this in a few months. If not on daily supplements, I recommend 2000iu daily.

## 2020-05-29 NOTE — Telephone Encounter (Signed)
Spoke to pt and relayed that lab results.  She has vit D 50,000iu but had not taken in several months. I relayed that her level was low normal range. AL/NP recommended to take at least 2000iu daily.  She verbalized understanding and appreciated call.

## 2020-06-13 ENCOUNTER — Telehealth: Payer: Self-pay | Admitting: Family Medicine

## 2020-06-13 NOTE — Telephone Encounter (Signed)
Pt is needing a refill on her pregabalin (LYRICA) 150 MG capsule [facility] [address]

## 2020-06-13 NOTE — Telephone Encounter (Signed)
Called patient and informed her lyrica was refilled on 05/26/20 x 6 months. [facility],  verbalized understanding, appreciation.

## 2020-06-28 ENCOUNTER — Other Ambulatory Visit: Payer: Self-pay | Admitting: Family Medicine

## 2020-06-28 MED ORDER — FENTANYL 25 MCG/HR TD PT72: 1.0000 | MEDICATED_PATCH | TRANSDERMAL | 0 refills | Status: DC

## 2020-06-28 NOTE — Telephone Encounter (Signed)
Pt is needing a refill on her fentaNYL (DURAGESIC) 25 MCG/HR [facility] [address].

## 2020-07-24 DIAGNOSIS — G35 Multiple sclerosis: Secondary | ICD-10-CM | POA: Diagnosis not present

## 2020-07-24 DIAGNOSIS — K219 Gastro-esophageal reflux disease without esophagitis: Secondary | ICD-10-CM | POA: Diagnosis not present

## 2020-07-24 DIAGNOSIS — K3184 Gastroparesis: Secondary | ICD-10-CM | POA: Diagnosis not present

## 2020-07-24 DIAGNOSIS — J302 Other seasonal allergic rhinitis: Secondary | ICD-10-CM | POA: Diagnosis not present

## 2020-07-24 DIAGNOSIS — N3289 Other specified disorders of bladder: Secondary | ICD-10-CM | POA: Diagnosis not present

## 2020-07-24 DIAGNOSIS — J301 Allergic rhinitis due to pollen: Secondary | ICD-10-CM | POA: Diagnosis not present

## 2020-07-24 DIAGNOSIS — M87059 Idiopathic aseptic necrosis of unspecified femur: Secondary | ICD-10-CM | POA: Diagnosis not present

## 2020-07-24 DIAGNOSIS — K5900 Constipation, unspecified: Secondary | ICD-10-CM | POA: Diagnosis not present

## 2020-07-24 DIAGNOSIS — Z0001 Encounter for general adult medical examination with abnormal findings: Secondary | ICD-10-CM | POA: Diagnosis not present

## 2020-07-24 DIAGNOSIS — E559 Vitamin D deficiency, unspecified: Secondary | ICD-10-CM | POA: Diagnosis not present

## 2020-07-27 ENCOUNTER — Other Ambulatory Visit: Payer: Self-pay | Admitting: Family Medicine

## 2020-07-27 NOTE — Addendum Note (Signed)
Addended by: Melanee Spry on: 07/27/2020 04:26 PM   Modules accepted: Orders

## 2020-07-27 NOTE — Telephone Encounter (Signed)
Pt has called for a refill on her fentaNYL (DURAGESIC) 25 MCG[facility]HR [facility] [phone]

## 2020-07-27 NOTE — Telephone Encounter (Signed)
Received refill request for fentanyl patch 41mcg/hr. Last OV was on 05/25/20 with Amy, NP.  Next OV is scheduled for 11/22/20 with Dr. Epimenio Foot.  Last RX was written on 06/28/20 for 10 patches.   [place] [facility] has been reviewed.

## 2020-07-31 ENCOUNTER — Other Ambulatory Visit: Payer: Self-pay | Admitting: Family Medicine

## 2020-07-31 MED ORDER — FENTANYL 25 MCG/HR TD PT72: 1.0000 | MEDICATED_PATCH | TRANSDERMAL | 0 refills | Status: DC

## 2020-07-31 NOTE — Telephone Encounter (Signed)
I called pt and she was told to get at [facility] [address] as they did not have at [facility] [place].  She verbalized understanding.

## 2020-07-31 NOTE — Addendum Note (Signed)
Addended by: Guy Begin on: 07/31/2020 04:26 PM   Modules accepted: Orders

## 2020-07-31 NOTE — Telephone Encounter (Signed)
Pt called stating [phone]  and they stated they have not received a refill request for fentaNYL (DURAGESIC) 25 MCG/HR

## 2020-07-31 NOTE — Telephone Encounter (Signed)
I called pt and relayed that I see was done at 1311 at [facility] [address] for Fentanyl.  She appreciated call back.

## 2020-07-31 NOTE — Telephone Encounter (Signed)
Pt is asking if the Fentanyl can please be sent to the [address] [facility] this afternoon so that she is not having to wait until Wed for the other [facility] to get the medication in stock.

## 2020-09-13 ENCOUNTER — Telehealth: Payer: Self-pay | Admitting: Family Medicine

## 2020-09-13 NOTE — Telephone Encounter (Signed)
Pt called requesting refill for pregabalin (LYRICA) 150 MG capsule. [phone]

## 2020-09-13 NOTE — Telephone Encounter (Signed)
Called pt to let her know we last refilled Lryica 05/25/20 #90, 1 refill. [facility]. She verbalized understanding.

## 2020-09-29 ENCOUNTER — Other Ambulatory Visit: Payer: Self-pay | Admitting: Family Medicine

## 2020-09-29 NOTE — Telephone Encounter (Signed)
Pt is needing her fentaNYL (DURAGESIC) 25 MCG/HR [facility] [address]  Pt states she is putting on the last one she has left today and is needing this refill by Monday. Please advise.

## 2020-10-02 MED ORDER — FENTANYL 25 MCG/HR TD PT72: 1.0000 | MEDICATED_PATCH | TRANSDERMAL | 0 refills | Status: DC

## 2020-10-02 NOTE — Telephone Encounter (Signed)
I have routed this request to Dr Sater for review. The pt is due for the medication and [place] registry was verified.  

## 2020-10-20 DIAGNOSIS — E559 Vitamin D deficiency, unspecified: Secondary | ICD-10-CM | POA: Diagnosis not present

## 2020-10-20 DIAGNOSIS — M87059 Idiopathic aseptic necrosis of unspecified femur: Secondary | ICD-10-CM | POA: Diagnosis not present

## 2020-10-20 DIAGNOSIS — J302 Other seasonal allergic rhinitis: Secondary | ICD-10-CM | POA: Diagnosis not present

## 2020-10-20 DIAGNOSIS — G35 Multiple sclerosis: Secondary | ICD-10-CM | POA: Diagnosis not present

## 2020-10-20 DIAGNOSIS — N3289 Other specified disorders of bladder: Secondary | ICD-10-CM | POA: Diagnosis not present

## 2020-10-20 DIAGNOSIS — J0100 Acute maxillary sinusitis, unspecified: Secondary | ICD-10-CM | POA: Diagnosis not present

## 2020-10-20 DIAGNOSIS — K3184 Gastroparesis: Secondary | ICD-10-CM | POA: Diagnosis not present

## 2020-10-20 DIAGNOSIS — K5900 Constipation, unspecified: Secondary | ICD-10-CM | POA: Diagnosis not present

## 2020-10-20 DIAGNOSIS — K219 Gastro-esophageal reflux disease without esophagitis: Secondary | ICD-10-CM | POA: Diagnosis not present

## 2020-10-20 DIAGNOSIS — J301 Allergic rhinitis due to pollen: Secondary | ICD-10-CM | POA: Diagnosis not present

## 2020-10-31 ENCOUNTER — Other Ambulatory Visit: Payer: Self-pay | Admitting: Family Medicine

## 2020-10-31 MED ORDER — FENTANYL 25 MCG/HR TD PT72: 1.0000 | MEDICATED_PATCH | TRANSDERMAL | 0 refills | Status: DC

## 2020-10-31 NOTE — Telephone Encounter (Signed)
Pt called requesting refill for fentaNYL (DURAGESIC) 25 MCG/HR. [phone].

## 2020-11-09 DIAGNOSIS — N3289 Other specified disorders of bladder: Secondary | ICD-10-CM | POA: Diagnosis not present

## 2020-11-09 DIAGNOSIS — J302 Other seasonal allergic rhinitis: Secondary | ICD-10-CM | POA: Diagnosis not present

## 2020-11-09 DIAGNOSIS — K219 Gastro-esophageal reflux disease without esophagitis: Secondary | ICD-10-CM | POA: Diagnosis not present

## 2020-11-09 DIAGNOSIS — K3184 Gastroparesis: Secondary | ICD-10-CM | POA: Diagnosis not present

## 2020-11-09 DIAGNOSIS — Z0000 Encounter for general adult medical examination without abnormal findings: Secondary | ICD-10-CM | POA: Diagnosis not present

## 2020-11-09 DIAGNOSIS — K5900 Constipation, unspecified: Secondary | ICD-10-CM | POA: Diagnosis not present

## 2020-11-09 DIAGNOSIS — M87059 Idiopathic aseptic necrosis of unspecified femur: Secondary | ICD-10-CM | POA: Diagnosis not present

## 2020-11-09 DIAGNOSIS — G35 Multiple sclerosis: Secondary | ICD-10-CM | POA: Diagnosis not present

## 2020-11-09 DIAGNOSIS — J301 Allergic rhinitis due to pollen: Secondary | ICD-10-CM | POA: Diagnosis not present

## 2020-11-14 ENCOUNTER — Other Ambulatory Visit: Payer: Self-pay | Admitting: Physician Assistant

## 2020-11-14 DIAGNOSIS — Z1231 Encounter for screening mammogram for malignant neoplasm of breast: Secondary | ICD-10-CM

## 2020-11-22 ENCOUNTER — Ambulatory Visit (INDEPENDENT_AMBULATORY_CARE_PROVIDER_SITE_OTHER): Payer: Medicare Other | Admitting: Neurology

## 2020-11-22 ENCOUNTER — Encounter: Payer: Self-pay | Admitting: Neurology

## 2020-11-22 VITALS — BP 138/91 | HR 72 | Ht 65.0 in | Wt 243.5 lb

## 2020-11-22 DIAGNOSIS — G894 Chronic pain syndrome: Secondary | ICD-10-CM

## 2020-11-22 DIAGNOSIS — R208 Other disturbances of skin sensation: Secondary | ICD-10-CM

## 2020-11-22 DIAGNOSIS — Z79891 Long term (current) use of opiate analgesic: Secondary | ICD-10-CM

## 2020-11-22 DIAGNOSIS — G4752 REM sleep behavior disorder: Secondary | ICD-10-CM | POA: Diagnosis not present

## 2020-11-22 DIAGNOSIS — M25552 Pain in left hip: Secondary | ICD-10-CM

## 2020-11-22 DIAGNOSIS — M25551 Pain in right hip: Secondary | ICD-10-CM

## 2020-11-22 DIAGNOSIS — Z79899 Other long term (current) drug therapy: Secondary | ICD-10-CM | POA: Diagnosis not present

## 2020-11-22 DIAGNOSIS — G35 Multiple sclerosis: Secondary | ICD-10-CM

## 2020-11-22 DIAGNOSIS — G8929 Other chronic pain: Secondary | ICD-10-CM | POA: Diagnosis not present

## 2020-11-22 MED ORDER — FENTANYL 25 MCG/HR TD PT72: 1.0000 | MEDICATED_PATCH | TRANSDERMAL | 0 refills | Status: DC
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
## 2020-12-03 LAB — DRUG SCREEN, UR (12+OXYCODONE+CRT)
Amphetamine Scrn, Ur: NEGATIVE ng/mL
BARBITURATE SCREEN URINE: NEGATIVE ng/mL
BENZODIAZEPINE SCREEN, URINE: NEGATIVE ng/mL
CANNABINOIDS UR QL SCN: POSITIVE — AB
Cocaine (Metab) Scrn, Ur: NEGATIVE ng/mL
Creatinine(Crt), U: 204.5 mg/dL (ref 20.0–300.0)
Fentanyl, Urine: POSITIVE — AB
Meperidine Screen, Urine: NEGATIVE ng/mL
Methadone Screen, Urine: NEGATIVE ng/mL
OXYCODONE+OXYMORPHONE UR QL SCN: NEGATIVE ng/mL
Opiate Scrn, Ur: NEGATIVE ng/mL
Ph of Urine: 5.5 (ref 4.5–8.9)
Phencyclidine Qn, Ur: NEGATIVE ng/mL
Propoxyphene Scrn, Ur: NEGATIVE ng/mL
SPECIFIC GRAVITY: 1.031
Tramadol Screen, Urine: NEGATIVE ng/mL

## 2020-12-13 ENCOUNTER — Other Ambulatory Visit: Payer: Self-pay | Admitting: Family Medicine

## 2020-12-13 ENCOUNTER — Ambulatory Visit
Admission: RE | Admit: 2020-12-13 | Discharge: 2020-12-13 | Disposition: A | Discharge status: Home / Self Care | Payer: Medicare Other | Source: Ambulatory Visit | Attending: Physician Assistant | Admitting: Physician Assistant

## 2020-12-13 DIAGNOSIS — Z1231 Encounter for screening mammogram for malignant neoplasm of breast: Secondary | ICD-10-CM

## 2020-12-13 MED ORDER — PREGABALIN 150 MG PO CAPS: ORAL_CAPSULE | ORAL | 1 refills | Status: DC

## 2020-12-13 NOTE — Telephone Encounter (Signed)
Pt requesting refill for pregabalin (LYRICA) 150 MG capsule. Pharmacy [phone] [place], [place] - [address]

## 2021-01-01 ENCOUNTER — Other Ambulatory Visit: Payer: Self-pay | Admitting: Neurology

## 2021-01-01 MED ORDER — FENTANYL 25 MCG/HR TD PT72: 1.0000 | MEDICATED_PATCH | TRANSDERMAL | 0 refills | Status: DC

## 2021-01-01 NOTE — Telephone Encounter (Signed)
Pt request refill for fentaNYL (DURAGESIC) 25 MCG/HR at  [id number]    

## 2021-01-01 NOTE — Telephone Encounter (Signed)
Received refill request for Fentanyl.  Last OV was on 11/22/20.  Next OV is scheduled for 04/05/21 .  Last RX was written on 11/30/20 for 10 patches.   [place] [facility] Database has been reviewed.

## 2021-01-25 ENCOUNTER — Other Ambulatory Visit: Payer: Self-pay | Admitting: Neurology

## 2021-01-25 MED ORDER — FENTANYL 25 MCG/HR TD PT72: 1.0000 | MEDICATED_PATCH | TRANSDERMAL | 0 refills | Status: DC

## 2021-01-25 NOTE — Telephone Encounter (Signed)
Pt request refill for fentaNYL (DURAGESIC) 25 MCG/HR at  [id number]    

## 2021-02-08 DIAGNOSIS — M87059 Idiopathic aseptic necrosis of unspecified femur: Secondary | ICD-10-CM | POA: Diagnosis not present

## 2021-02-08 DIAGNOSIS — N3289 Other specified disorders of bladder: Secondary | ICD-10-CM | POA: Diagnosis not present

## 2021-02-08 DIAGNOSIS — J208 Acute bronchitis due to other specified organisms: Secondary | ICD-10-CM | POA: Diagnosis not present

## 2021-02-08 DIAGNOSIS — J302 Other seasonal allergic rhinitis: Secondary | ICD-10-CM | POA: Diagnosis not present

## 2021-02-08 DIAGNOSIS — G35 Multiple sclerosis: Secondary | ICD-10-CM | POA: Diagnosis not present

## 2021-02-08 DIAGNOSIS — K5900 Constipation, unspecified: Secondary | ICD-10-CM | POA: Diagnosis not present

## 2021-02-08 DIAGNOSIS — K219 Gastro-esophageal reflux disease without esophagitis: Secondary | ICD-10-CM | POA: Diagnosis not present

## 2021-02-08 DIAGNOSIS — K3184 Gastroparesis: Secondary | ICD-10-CM | POA: Diagnosis not present

## 2021-02-08 DIAGNOSIS — J301 Allergic rhinitis due to pollen: Secondary | ICD-10-CM | POA: Diagnosis not present

## 2021-02-28 ENCOUNTER — Other Ambulatory Visit: Payer: Self-pay | Admitting: Neurology

## 2021-02-28 MED ORDER — FENTANYL 25 MCG/HR TD PT72: 1.0000 | MEDICATED_PATCH | TRANSDERMAL | 0 refills | Status: DC

## 2021-02-28 NOTE — Telephone Encounter (Signed)
Pt is requesting a refill for fentaNYL (DURAGESIC) 25 MCG/HR .  Pharmacy: [id number]   

## 2021-02-28 NOTE — Telephone Encounter (Signed)
Checked drug registry and she last refilled 01/29/21 #10. Last seen 11/22/20 and next follow up 04/05/21.

## 2021-03-08 DIAGNOSIS — J302 Other seasonal allergic rhinitis: Secondary | ICD-10-CM | POA: Diagnosis not present

## 2021-03-08 DIAGNOSIS — N3289 Other specified disorders of bladder: Secondary | ICD-10-CM | POA: Diagnosis not present

## 2021-03-08 DIAGNOSIS — K5900 Constipation, unspecified: Secondary | ICD-10-CM | POA: Diagnosis not present

## 2021-03-08 DIAGNOSIS — M87059 Idiopathic aseptic necrosis of unspecified femur: Secondary | ICD-10-CM | POA: Diagnosis not present

## 2021-03-08 DIAGNOSIS — K219 Gastro-esophageal reflux disease without esophagitis: Secondary | ICD-10-CM | POA: Diagnosis not present

## 2021-03-08 DIAGNOSIS — J301 Allergic rhinitis due to pollen: Secondary | ICD-10-CM | POA: Diagnosis not present

## 2021-03-08 DIAGNOSIS — M79672 Pain in left foot: Secondary | ICD-10-CM | POA: Diagnosis not present

## 2021-03-08 DIAGNOSIS — K3184 Gastroparesis: Secondary | ICD-10-CM | POA: Diagnosis not present

## 2021-03-08 DIAGNOSIS — M79671 Pain in right foot: Secondary | ICD-10-CM | POA: Diagnosis not present

## 2021-03-08 DIAGNOSIS — G35 Multiple sclerosis: Secondary | ICD-10-CM | POA: Diagnosis not present

## 2021-03-15 ENCOUNTER — Other Ambulatory Visit: Payer: Self-pay

## 2021-03-15 ENCOUNTER — Ambulatory Visit (INDEPENDENT_AMBULATORY_CARE_PROVIDER_SITE_OTHER): Payer: Medicare Other | Admitting: Podiatry

## 2021-03-15 ENCOUNTER — Ambulatory Visit (INDEPENDENT_AMBULATORY_CARE_PROVIDER_SITE_OTHER): Payer: Medicare Other

## 2021-03-15 DIAGNOSIS — M79672 Pain in left foot: Secondary | ICD-10-CM

## 2021-03-15 DIAGNOSIS — M79671 Pain in right foot: Secondary | ICD-10-CM | POA: Diagnosis not present

## 2021-03-15 DIAGNOSIS — M722 Plantar fascial fibromatosis: Secondary | ICD-10-CM | POA: Diagnosis not present

## 2021-03-15 DIAGNOSIS — L600 Ingrowing nail: Secondary | ICD-10-CM

## 2021-03-15 MED ORDER — MELOXICAM 15 MG PO TABS: 15.0000 mg | ORAL_TABLET | Freq: Every day | ORAL | 0 refills | Status: DC | PRN

## 2021-03-15 NOTE — Patient Instructions (Signed)

## 2021-03-19 NOTE — Progress Notes (Signed)
Subjective:   Patient ID: Joy Cobb, female   DOB: 41 y.o.   MRN: [id number]   HPI 41 year old female with past medical history significant for MS, presents the office today for concerns of bilateral foot pain which were about 6 months ago.  She describes discomfort at the heel.  She states that it is worse in the morning when she first gets up.  She has tried resting, Voltaren gel as well as pain medicine.  She has not seen any increase in swelling and no injury.  She states that when she rests her foot feels better.  Also concern about ingrown toenail on the right big toe, medial aspect.  No swelling redness or any drainage.  She works for [employer].   Review of Systems  All other systems reviewed and are negative.  Past Medical History:  Diagnosis Date   Bladder spasms    Colonic inertia    GERD (gastroesophageal reflux disease)    IBS (irritable bowel syndrome)    Insomnia    Multiple sclerosis (HCC)    Neuropathy    Renal disorder    Vision abnormalities     Past Surgical History:  Procedure Laterality Date   BREAST SURGERY     CYSTECTOMY     polyp removal     UPPER GASTROINTESTINAL ENDOSCOPY       Current Outpatient Medications:    meloxicam (MOBIC) 15 MG tablet, Take 1 tablet (15 mg total) by mouth daily as needed for pain., Disp: 30 tablet, Rfl: 0   albuterol (VENTOLIN HFA) 108 (90 Base) MCG/ACT inhaler, Inhale 2 puffs into the lungs every 6 (six) hours as needed for wheezing or shortness of breath., Disp: 2 each, Rfl: 2   baclofen (LIORESAL) 10 MG tablet, Take 1 tablet (10 mg total) by mouth 3 (three) times daily., Disp: 270 tablet, Rfl: 3   cetirizine (ZYRTEC) 10 MG tablet, Take 10 mg by mouth daily., Disp: , Rfl:    [facility] TUSSIN DM MAX ST 20-400 MG/20ML LIQD, Take by mouth., Disp: , Rfl:    drospirenone-ethinyl estradiol (YAZ,GIANVI,LORYNA) 3-0.02 MG tablet, Take 1 tablet by mouth daily., Disp: , Rfl:    famotidine (PEPCID) 40 MG tablet, Take 40 mg  by mouth daily. , Disp: , Rfl:    fentaNYL (DURAGESIC) 25 MCG/HR, Place 1 patch onto the skin every 3 (three) days., Disp: 10 patch, Rfl: 0   FLUoxetine (PROZAC) 40 MG capsule, , Disp: , Rfl:    hydrOXYzine (ATARAX/VISTARIL) 25 MG tablet, Take 25 mg by mouth at bedtime., Disp: , Rfl:    linaclotide (LINZESS) 290 MCG CAPS capsule, Take 290 mcg by mouth daily before breakfast., Disp: , Rfl:    meloxicam (MOBIC) 15 MG tablet, Take 15 mg by mouth daily as needed (For hip pain flare ups). , Disp: , Rfl: 2   metoCLOPramide (REGLAN) 5 MG tablet, Take 10 mg by mouth in the morning and at bedtime. , Disp: , Rfl:    nystatin cream (MYCOSTATIN), Apply topically 2 (two) times daily., Disp: , Rfl:    pantoprazole (PROTONIX) 40 MG tablet, Take 40 mg by mouth daily., Disp: , Rfl:    pregabalin (LYRICA) 150 MG capsule, TAKE ONE CAPSULE BY MOUTH AT BEDTIME, Disp: 90 capsule, Rfl: 1   sucralfate (CARAFATE) 1 GM/10ML suspension, , Disp: , Rfl:    TECFIDERA 240 MG CPDR, Take 1 capsule by mouth twice daily, Disp: 180 capsule, Rfl: 3   traZODone (DESYREL) 50 MG  tablet, Take 100 mg by mouth at bedtime., Disp: , Rfl:    Vitamin D, Ergocalciferol, (DRISDOL) 50000 units CAPS capsule, Take 50,000 Units by mouth 2 (two) times a week. , Disp: , Rfl:   Allergies  Allergen Reactions   Amoxicillin    Penicillins Hives   Temazepam     Sharp pains in stomach   Septra [Sulfamethoxazole-Trimethoprim] Rash         Objective:  Physical Exam  General: AAO x3, NAD  Dermatological: Incurvation present on the medial aspect of right hallux toenail with tenderness palpation the distal portion of the nail.  No edema, erythema or signs of infection.  There is no open lesions.  Vascular: Dorsalis Pedis artery and Posterior Tibial artery pedal pulses are 2/4 bilateral with immedate capillary fill time. There is no pain with calf compression, swelling, warmth, erythema.   Neruologic: Grossly intact via light touch bilateral.    Musculoskeletal: Tenderness to palpation along the plantar medial tubercle of the calcaneus at the insertion of plantar fascia on the right > left foot. There is no pain along the course of the plantar fascia within the arch of the foot. Plantar fascia appears to be intact. There is no pain with lateral compression of the calcaneus or pain with vibratory sensation. There is no pain along the course or insertion of the achilles tendon. No other areas of tenderness to bilateral lower extremities.  Muscular strength 5/5 in all groups tested bilateral.  Bunion present      Assessment:   Bilateral heel pain, likely plantar fasciitis; ingrown toenail     Plan:  -Treatment options discussed including all alternatives, risks, and complications -Etiology of symptoms were discussed -X-rays were obtained and reviewed with the patient.  There is no evidence of acute fracture or stress fracture identified today.  Mild calcaneal spurring present left side worse than right.  Bunions present. -Refill meloxicam today. -Plantar fascial brace dispensed x2 -Night splint dispensed x1 -We discussed stretching, icing daily.  Discussed shoe modifications good arch support as well. -For the ingrown toenail I sharply debride this and not nail with any complications or bleeding.  If symptoms continue consider partial nail avulsion.  Vivi Barrack DPM

## 2021-03-29 ENCOUNTER — Other Ambulatory Visit: Payer: Self-pay | Admitting: Neurology

## 2021-03-29 MED ORDER — FENTANYL 25 MCG/HR TD PT72: 1.0000 | MEDICATED_PATCH | TRANSDERMAL | 0 refills | Status: DC

## 2021-03-29 NOTE — Telephone Encounter (Signed)
Pt is requesting a refill for fentaNYL (DURAGESIC) 25 MCG/HR .  Pharmacy: [id number]   

## 2021-03-29 NOTE — Telephone Encounter (Signed)
Received refill request for fentanyl patch.  Last OV was on 11/22/20.  Next OV is scheduled for 04/05/21 .  Last RX was written on 02/28/21 for 10 patches.   [place] [facility] Database has been reviewed. Please fill, Dr. Epimenio Foot is off.

## 2021-04-03 ENCOUNTER — Telehealth: Payer: Self-pay | Admitting: Neurology

## 2021-04-03 DIAGNOSIS — G35 Multiple sclerosis: Secondary | ICD-10-CM

## 2021-04-03 MED ORDER — TECFIDERA 240 MG PO CPDR: DELAYED_RELEASE_CAPSULE | ORAL | 2 refills | Status: DC

## 2021-04-03 NOTE — Telephone Encounter (Signed)
Pt is requesting a refill for TECFIDERA 240 MG CPDR.  Pharmacy: [facility]

## 2021-04-03 NOTE — Telephone Encounter (Signed)
Refill sent.

## 2021-04-04 NOTE — Patient Instructions (Addendum)
Below is our plan: ? ?We will continue current treatment plan. We will add on Modafinil 100mg  daily for your MS fatigue ? ?Please make sure you are staying well hydrated. I recommend 50-60 ounces daily. Well balanced diet and regular exercise encouraged. Consistent sleep schedule with 6-8 hours recommended.  ? ?Please continue follow up with care team as directed.  ? ?Follow up with Dr in 6 months  ? ?You may receive a survey regarding today's visit. I encourage you to leave honest feed back as I do use this information to improve patient care. Thank you for seeing me today!  ? ? ?
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
## 2021-04-05 ENCOUNTER — Encounter: Payer: Self-pay | Admitting: Family Medicine

## 2021-04-05 ENCOUNTER — Ambulatory Visit: Payer: Medicare Other | Admitting: Family Medicine

## 2021-04-05 ENCOUNTER — Telehealth: Payer: Self-pay | Admitting: *Deleted

## 2021-04-05 ENCOUNTER — Ambulatory Visit (INDEPENDENT_AMBULATORY_CARE_PROVIDER_SITE_OTHER): Payer: Medicare Other | Admitting: Family Medicine

## 2021-04-05 VITALS — BP 132/87 | HR 74 | Ht 65.0 in | Wt 260.0 lb

## 2021-04-05 DIAGNOSIS — R5382 Chronic fatigue, unspecified: Secondary | ICD-10-CM | POA: Diagnosis not present

## 2021-04-05 DIAGNOSIS — Z79899 Other long term (current) drug therapy: Secondary | ICD-10-CM | POA: Diagnosis not present

## 2021-04-05 DIAGNOSIS — G894 Chronic pain syndrome: Secondary | ICD-10-CM

## 2021-04-05 DIAGNOSIS — E559 Vitamin D deficiency, unspecified: Secondary | ICD-10-CM | POA: Diagnosis not present

## 2021-04-05 DIAGNOSIS — G35 Multiple sclerosis: Secondary | ICD-10-CM | POA: Diagnosis not present

## 2021-04-05 MED ORDER — MODAFINIL 100 MG PO TABS: 100.0000 mg | ORAL_TABLET | Freq: Every day | ORAL | Status: DC

## 2021-04-05 NOTE — Telephone Encounter (Signed)
PA Tecfidera submitted on CMM. Key: [id number]. Waiting on determination from [employer]. ?

## 2021-04-06 LAB — HEPATIC FUNCTION PANEL
ALT: 13 IU/L (ref 0–32)
AST: 16 IU/L (ref 0–40)
Albumin: 4.2 g/dL (ref 3.8–4.8)
Alkaline Phosphatase: 63 IU/L (ref 44–121)
Bilirubin Total: 0.3 mg/dL (ref 0.0–1.2)
Bilirubin, Direct: <0.1 mg/dL (ref 0.00–0.40)
Total Protein: 6.9 g/dL (ref 6.0–8.5)

## 2021-04-06 LAB — CBC WITH DIFFERENTIAL/PLATELET
Basophils Absolute: 0 10*3/uL (ref 0.0–0.2)
Basos: 0 %
EOS (ABSOLUTE): 0.2 10*3/uL (ref 0.0–0.4)
Eos: 4 %
Hematocrit: 30.2 % — ABNORMAL LOW (ref 34.0–46.6)
Hemoglobin: 11.1 g/dL (ref 11.1–15.9)
Immature Grans (Abs): 0 10*3/uL (ref 0.0–0.1)
Immature Granulocytes: 0 %
Lymphocytes Absolute: 1.8 10*3/uL (ref 0.7–3.1)
Lymphs: 35 %
MCH: 30.5 pg (ref 26.6–33.0)
MCHC: 36.8 g/dL — ABNORMAL HIGH (ref 31.5–35.7)
MCV: 83 fL (ref 79–97)
Monocytes Absolute: 0.4 10*3/uL (ref 0.1–0.9)
Monocytes: 8 %
Neutrophils Absolute: 2.7 10*3/uL (ref 1.4–7.0)
Neutrophils: 53 %
Platelets: 167 10*3/uL (ref 150–450)
RBC: 3.64 x10E6/uL — ABNORMAL LOW (ref 3.77–5.28)
RDW: 12.6 % (ref 11.7–15.4)
WBC: 5.1 10*3/uL (ref 3.4–10.8)

## 2021-04-10 DIAGNOSIS — J301 Allergic rhinitis due to pollen: Secondary | ICD-10-CM | POA: Diagnosis not present

## 2021-04-10 DIAGNOSIS — J40 Bronchitis, not specified as acute or chronic: Secondary | ICD-10-CM | POA: Diagnosis not present

## 2021-04-10 DIAGNOSIS — K3184 Gastroparesis: Secondary | ICD-10-CM | POA: Diagnosis not present

## 2021-04-10 DIAGNOSIS — K219 Gastro-esophageal reflux disease without esophagitis: Secondary | ICD-10-CM | POA: Diagnosis not present

## 2021-04-10 DIAGNOSIS — R062 Wheezing: Secondary | ICD-10-CM | POA: Diagnosis not present

## 2021-04-10 DIAGNOSIS — N3289 Other specified disorders of bladder: Secondary | ICD-10-CM | POA: Diagnosis not present

## 2021-04-10 DIAGNOSIS — G35 Multiple sclerosis: Secondary | ICD-10-CM | POA: Diagnosis not present

## 2021-04-10 DIAGNOSIS — K5900 Constipation, unspecified: Secondary | ICD-10-CM | POA: Diagnosis not present

## 2021-04-10 DIAGNOSIS — M87059 Idiopathic aseptic necrosis of unspecified femur: Secondary | ICD-10-CM | POA: Diagnosis not present

## 2021-04-17 ENCOUNTER — Other Ambulatory Visit: Payer: Self-pay | Admitting: Podiatry

## 2021-04-18 NOTE — Telephone Encounter (Signed)
Called [employer] to check on status of PA. PA originally denied but was overturned once they received additional info we sent via fax 04/09/21. Appeals overturned case and approved med.Case# [id number]. Effective 04/05/21-02/03/22.  ?

## 2021-04-20 DIAGNOSIS — K5900 Constipation, unspecified: Secondary | ICD-10-CM | POA: Diagnosis not present

## 2021-04-20 DIAGNOSIS — G35 Multiple sclerosis: Secondary | ICD-10-CM | POA: Diagnosis not present

## 2021-04-20 DIAGNOSIS — K219 Gastro-esophageal reflux disease without esophagitis: Secondary | ICD-10-CM | POA: Diagnosis not present

## 2021-04-20 DIAGNOSIS — K3184 Gastroparesis: Secondary | ICD-10-CM | POA: Diagnosis not present

## 2021-04-24 DIAGNOSIS — M87059 Idiopathic aseptic necrosis of unspecified femur: Secondary | ICD-10-CM | POA: Diagnosis not present

## 2021-04-24 DIAGNOSIS — K3184 Gastroparesis: Secondary | ICD-10-CM | POA: Diagnosis not present

## 2021-04-24 DIAGNOSIS — K5900 Constipation, unspecified: Secondary | ICD-10-CM | POA: Diagnosis not present

## 2021-04-24 DIAGNOSIS — N3289 Other specified disorders of bladder: Secondary | ICD-10-CM | POA: Diagnosis not present

## 2021-04-24 DIAGNOSIS — R5383 Other fatigue: Secondary | ICD-10-CM | POA: Diagnosis not present

## 2021-04-24 DIAGNOSIS — J301 Allergic rhinitis due to pollen: Secondary | ICD-10-CM | POA: Diagnosis not present

## 2021-04-24 DIAGNOSIS — G35 Multiple sclerosis: Secondary | ICD-10-CM | POA: Diagnosis not present

## 2021-04-24 DIAGNOSIS — K219 Gastro-esophageal reflux disease without esophagitis: Secondary | ICD-10-CM | POA: Diagnosis not present

## 2021-04-25 DIAGNOSIS — H52223 Regular astigmatism, bilateral: Secondary | ICD-10-CM | POA: Diagnosis not present

## 2021-04-26 ENCOUNTER — Other Ambulatory Visit: Payer: Self-pay

## 2021-04-26 ENCOUNTER — Ambulatory Visit (INDEPENDENT_AMBULATORY_CARE_PROVIDER_SITE_OTHER): Payer: Medicare Other | Admitting: Podiatry

## 2021-04-26 DIAGNOSIS — M79671 Pain in right foot: Secondary | ICD-10-CM | POA: Diagnosis not present

## 2021-04-26 DIAGNOSIS — E569 Vitamin deficiency, unspecified: Secondary | ICD-10-CM | POA: Diagnosis not present

## 2021-04-26 DIAGNOSIS — M722 Plantar fascial fibromatosis: Secondary | ICD-10-CM

## 2021-04-26 DIAGNOSIS — M79672 Pain in left foot: Secondary | ICD-10-CM

## 2021-04-26 DIAGNOSIS — R5383 Other fatigue: Secondary | ICD-10-CM | POA: Diagnosis not present

## 2021-04-28 DIAGNOSIS — M722 Plantar fascial fibromatosis: Secondary | ICD-10-CM | POA: Insufficient documentation

## 2021-04-28 NOTE — Progress Notes (Signed)
Subjective: ?41 year old female presents the office today for evaluation of bilateral heel pain, Planter fasciitis.  She is overall she is doing better but still having discomfort of the heel, arches.  She has been using plantar fascial brace as well as 1 night splint but she is having to alternate each night.  She has been using anti-inflammatories as needed as well.  She still stretching, icing daily.  No new concerns ? ?Objective: ?AAO x3, NAD ?DP/PT pulses palpable bilaterally, CRT less than 3 seconds ?There is continuation of tenderness palpation although improved on the medial aspect plantarly at the insertion of the plantar fascia.  There is no pain with lateral compression of calcaneus.  Mild discomfort in the arch of the foot but not significant today.  There is no area pinpoint tenderness.  Flexor, extensor tendons are intact.  MMT 5/5. ?No pain with calf compression, swelling, warmth, erythema ? ?Assessment: ?Plantar fasciitis ? ?Plan: ?-All treatment options discussed with the patient including all alternatives, risks, complications.  ?-Continue stretching, icing daily.  Continue with shoes and good arch support we discussed changing shoes that she is wearing crocs today although these are comfortable for her.  Continue plantar fascial braces that they are helpful.  I dispensed the night splint today.  Hopefully this will help continue the stretching and help limit the morning discomfort.  Offered steroid injection.  Continue anti-inflammatories as needed. ?-I provided her with the codes for orthotics for her to contact her insurance company to see if they will be covered. ?-Patient encouraged to call the office with any questions, concerns, change in symptoms.  ? ?Joy Cobb DPM ? ?

## 2021-05-01 ENCOUNTER — Other Ambulatory Visit: Payer: Self-pay | Admitting: Family Medicine

## 2021-05-01 MED ORDER — FENTANYL 25 MCG/HR TD PT72: 1.0000 | MEDICATED_PATCH | TRANSDERMAL | 0 refills | Status: DC

## 2021-05-01 NOTE — Telephone Encounter (Signed)
Pt request refill for fentaNYL (DURAGESIC) 25 MCG/HR at  [id number]    

## 2021-05-22 ENCOUNTER — Other Ambulatory Visit: Payer: Self-pay | Admitting: Podiatry

## 2021-05-22 DIAGNOSIS — R5383 Other fatigue: Secondary | ICD-10-CM | POA: Diagnosis not present

## 2021-05-22 DIAGNOSIS — G479 Sleep disorder, unspecified: Secondary | ICD-10-CM | POA: Diagnosis not present

## 2021-05-22 DIAGNOSIS — D649 Anemia, unspecified: Secondary | ICD-10-CM | POA: Diagnosis not present

## 2021-05-22 NOTE — Telephone Encounter (Signed)
Please advise 

## 2021-05-31 ENCOUNTER — Other Ambulatory Visit: Payer: Self-pay | Admitting: Family Medicine

## 2021-05-31 MED ORDER — FENTANYL 25 MCG/HR TD PT72: 1.0000 | MEDICATED_PATCH | TRANSDERMAL | 0 refills | Status: DC

## 2021-05-31 NOTE — Telephone Encounter (Signed)
I have routed this request to Dr Felecia Shelling for review. The pt is due for the medication and [place] registry was verified. ?Last ov 04/05/21. Last filled per [place] drug registry 05/01/2021 ? ?

## 2021-05-31 NOTE — Telephone Encounter (Signed)
Pt is requesting a refill for fentaNYL (DURAGESIC) 25 MCG/HR .  Pharmacy: [id number]   

## 2021-06-04 MED ORDER — FENTANYL 25 MCG/HR TD PT72: 1.0000 | MEDICATED_PATCH | TRANSDERMAL | 0 refills | Status: DC

## 2021-06-04 NOTE — Addendum Note (Signed)
Addended by: Wyvonnia Lora on: 06/04/2021 03:08 PM ? ? Modules accepted: Orders ? ?

## 2021-06-04 NOTE — Telephone Encounter (Signed)
Pt has called to report that a new [facility] [address] are the only [facility] that has it) Pt has been without this fentaNYL (DURAGESIC) 25 MCG/HR. Since Saturday ?

## 2021-06-05 DIAGNOSIS — K3184 Gastroparesis: Secondary | ICD-10-CM | POA: Diagnosis not present

## 2021-06-05 DIAGNOSIS — K219 Gastro-esophageal reflux disease without esophagitis: Secondary | ICD-10-CM | POA: Diagnosis not present

## 2021-06-18 ENCOUNTER — Ambulatory Visit (INDEPENDENT_AMBULATORY_CARE_PROVIDER_SITE_OTHER): Payer: Medicare Other | Admitting: Pulmonary Disease

## 2021-06-18 ENCOUNTER — Other Ambulatory Visit: Payer: Self-pay | Admitting: Family Medicine

## 2021-06-18 ENCOUNTER — Encounter: Payer: Self-pay | Admitting: Pulmonary Disease

## 2021-06-18 VITALS — BP 110/78 | HR 78 | Temp 97.8°F | Ht 65.0 in | Wt 257.8 lb

## 2021-06-18 DIAGNOSIS — R0683 Snoring: Secondary | ICD-10-CM

## 2021-06-18 MED ORDER — PREGABALIN 150 MG PO CAPS: ORAL_CAPSULE | ORAL | 1 refills | Status: DC

## 2021-06-18 NOTE — Patient Instructions (Signed)
Continue trazodone 100 ? ?Ask your primary doctor about Provigil which was on your list of medications ? ?Your previous sleep study was negative ? ?As your symptoms are stable at present, no clear indication for repeat sleep study ? ?If concern remains for REM behavior disorder-it is usually based off of your history or witnessed abnormal behavior at night ? ?Call us with significant concerns ? ?A sleep study can always be ordered to evaluate quality of your sleep ? ?Call with significant concerns ? ?Follow-up in 3 to 4 months ?

## 2021-06-18 NOTE — Addendum Note (Signed)
Addended by: Virl Diamond A on: 06/18/2021 05:27 PM ? ? Modules accepted: Level of Service ? ?

## 2021-06-18 NOTE — Progress Notes (Addendum)
? ?      ?Joy Cobb    [id number]    1980-07-17 ? ?Primary Care Physician:Vanstory, Suzan Slick, PA ? ?Referring Physician: Jackie Plum, MD ?[phone] ?[address] ?[place] [id number] ? ?Chief complaint:   ?Asked to see patient for insomnia, REM behavior disorder ? ?HPI: ? ?Patient stated she is sleeping a whole lot better with being on trazodone 100 ? ?Had a sleep study in 2016 that was negative for significant sleep disordered breathing ?Sleep study at the time did show decreased sleep efficiency ? ?No significant changes to her medications recently which include fentanyl patch, trazodone for insomnia, Lyrica, Tecfidera ? ?She has a history of multiple sclerosis, she has been on the same medication for about 9 years and states that she has not had any new neurological symptoms ? ?At some point she did receive a dose of modafinil which she does not recollect ? ?History of allergic rhinitis, GERD, irritable bowel ?Multiple sclerosis with optic neuritis ?Class III obesity, some ataxia ?History of REM behavior disorders, remembers vivid dreams, she has been hitting out in her dreams, no injury ? ?Outpatient Encounter Medications as of 06/18/2021  ?Medication Sig  ? albuterol (VENTOLIN HFA) 108 (90 Base) MCG/ACT inhaler Inhale 2 puffs into the lungs every 6 (six) hours as needed for wheezing or shortness of breath.  ? baclofen (LIORESAL) 10 MG tablet Take 1 tablet (10 mg total) by mouth 3 (three) times daily.  ? cetirizine (ZYRTEC) 10 MG tablet Take 10 mg by mouth daily.  ? [facility] TUSSIN DM MAX ST 20-400 MG/20ML LIQD Take by mouth.  ? drospirenone-ethinyl estradiol (YAZ,GIANVI,LORYNA) 3-0.02 MG tablet Take 1 tablet by mouth daily.  ? fentaNYL (DURAGESIC) 25 MCG/HR Place 1 patch onto the skin every 3 (three) days.  ? FLUoxetine (PROZAC) 40 MG capsule   ? hydrOXYzine (ATARAX/VISTARIL) 25 MG tablet Take 25 mg by mouth at bedtime.  ? linaclotide (LINZESS) 290 MCG CAPS capsule Take 290 mcg by mouth  daily before breakfast.  ? meloxicam (MOBIC) 15 MG tablet Take 15 mg by mouth daily as needed (For hip pain flare ups).   ? meloxicam (MOBIC) 15 MG tablet TAKE 1 TABLET BY MOUTH EVERY DAY AS NEEDED FOR PAIN  ? metoCLOPramide (REGLAN) 5 MG tablet Take 10 mg by mouth in the morning and at bedtime.   ? nystatin cream (MYCOSTATIN) Apply topically 2 (two) times daily.  ? pantoprazole (PROTONIX) 40 MG tablet Take 40 mg by mouth daily.  ? pregabalin (LYRICA) 150 MG capsule TAKE ONE CAPSULE BY MOUTH AT BEDTIME  ? sucralfate (CARAFATE) 1 GM/10ML suspension   ? TECFIDERA 240 MG CPDR Take 1 capsule by mouth twice daily  ? traZODone (DESYREL) 50 MG tablet Take 100 mg by mouth at bedtime.  ? Vitamin D, Ergocalciferol, (DRISDOL) 50000 units CAPS capsule Take 50,000 Units by mouth 2 (two) times a week.   ? [DISCONTINUED] famotidine (PEPCID) 40 MG tablet Take 40 mg by mouth daily.   ? [DISCONTINUED] fentaNYL (DURAGESIC) 25 MCG/HR Place 1 patch onto the skin every 3 (three) days. (Patient not taking: Reported on 06/18/2021)  ? [DISCONTINUED] promethazine-dextromethorphan (PROMETHAZINE-DM) 6.25-15 MG/5ML syrup Take 5 mLs by mouth every 6 (six) hours. (Patient not taking: Reported on 06/18/2021)  ? ?Facility-Administered Encounter Medications as of 06/18/2021  ?Medication  ? modafinil (PROVIGIL) tablet 100 mg  ? ? ?Allergies as of 06/18/2021 - Review Complete 06/18/2021  ?Allergen Reaction Noted  ? Penicillins Hives 02/05/2012  ?  Temazepam Nausea Only 02/14/2012  ? Amoxicillin Rash 02/14/2012  ? Septra [sulfamethoxazole-trimethoprim] Rash 02/05/2012  ? ? ?Past Medical History:  ?Diagnosis Date  ? Bladder spasms   ? Colonic inertia   ? GERD (gastroesophageal reflux disease)   ? IBS (irritable bowel syndrome)   ? Insomnia   ? Multiple sclerosis (HCC)   ? Neuropathy   ? Renal disorder   ? Vision abnormalities   ? ? ?Past Surgical History:  ?Procedure Laterality Date  ? BREAST SURGERY    ? CYSTECTOMY    ? polyp removal    ? UPPER  GASTROINTESTINAL ENDOSCOPY    ? ? ?Family History  ?Problem Relation Age of Onset  ? Neuropathy Mother   ? Allergic rhinitis Mother   ? Urticaria Mother   ? Cancer - Other Father   ? Diabetes Maternal Grandmother   ? Hypertension Maternal Grandmother   ? Kidney failure Maternal Grandmother   ? Parkinson's disease Maternal Grandmother   ? Thyroid disease Maternal Aunt   ? Breast cancer Maternal Aunt   ? Parkinson's disease Maternal Aunt   ? Colon cancer Other   ? Asthma Neg Hx   ? Eczema Neg Hx   ? Esophageal cancer Neg Hx   ? Rectal cancer Neg Hx   ? Stomach cancer Neg Hx   ? ? ?Social History  ? ?Socioeconomic History  ? Marital status: Single  ?  Spouse name: Not on file  ? Number of children: Not on file  ? Years of education: Not on file  ? Highest education level: Not on file  ?Occupational History  ? Not on file  ?Tobacco Use  ? Smoking status: Some Days  ?  Types: E-cigarettes  ?  Start date: 02/05/2020  ?  Passive exposure: Never  ? Smokeless tobacco: Never  ? Tobacco comments:  ?  Vapes some but not on a regular basis. 06/18/21. Hsm.   ?Vaping Use  ? Vaping Use: Never used  ?Substance and Sexual Activity  ? Alcohol use: Yes  ? Drug use: No  ? Sexual activity: Not Currently  ?Other Topics Concern  ? Not on file  ?Social History Narrative  ? Not on file  ? ?Social Determinants of Health  ? ?Financial Resource Strain: Not on file  ?Food Insecurity: Not on file  ?Transportation Needs: Not on file  ?Physical Activity: Not on file  ?Stress: Not on file  ?Social Connections: Not on file  ?Intimate Partner Violence: Not on file  ? ? ?Review of Systems  ?Constitutional:  Negative for fatigue.  ?Respiratory:  Negative for apnea, choking and shortness of breath.   ?Psychiatric/Behavioral:  Negative for sleep disturbance.   ? ?Vitals:  ? 06/18/21 1511  ?BP: 110/78  ?Pulse: 78  ?Temp: 97.8 ?F (36.6 ?C)  ?SpO2: 99%  ? ? ? ?Physical Exam ?Constitutional:   ?   Appearance: She is obese.  ?HENT:  ?   Head: Normocephalic.  ?    Nose: Nose normal. No congestion.  ?   Mouth/Throat:  ?   Mouth: Mucous membranes are moist.  ?   Comments: Mallampati 3, crowded oropharynx ?Cardiovascular:  ?   Rate and Rhythm: Normal rate and regular rhythm.  ?   Heart sounds: No murmur heard. ?  No friction rub.  ?Pulmonary:  ?   Effort: No respiratory distress.  ?   Breath sounds: No stridor. No wheezing or rhonchi.  ?Musculoskeletal:  ?   Cervical back: No rigidity or tenderness.  ?  Neurological:  ?   Mental Status: She is alert.  ?Psychiatric:     ?   Mood and Affect: Mood normal.  ? ?Data Reviewed: ?Previous sleep study reviewed from 2016 showing sleep efficiency of 69%, no REM sleep, no slow-wave sleep ? ? ? ?  06/18/2021  ?  3:00 PM  ?Results of the Epworth flowsheet  ?Sitting and reading 2  ?Watching TV 1  ?Sitting, inactive in a public place (e.g. a theatre or a meeting) 0  ?As a passenger in a car for an hour without a break 0  ?Lying down to rest in the afternoon when circumstances permit 0  ?Sitting and talking to someone 0  ?Sitting quietly after a lunch without alcohol 0  ?In a car, while stopped for a few minutes in traffic 0  ?Total score 3  ? ? ? ?Assessment:  ?Nonrestorative sleep ? ?No significant daytime sleepiness ? ?REM behavior disorder ? ?Insomnia ?-Appears well managed with use of trazodone at 100 mg ? ?She does describe a history of chronic fatigue and chronic inertia ?-This does not appear to be related to hours of sleep ?Being on trazodone has definitely helped hours of sleep but not affecting how she feels during the day ?Denies feeling very sleepy during the day ? ?Plan/Recommendations: ?We can plan for a sleep study if patient continues to have significant symptoms of nonrestorative sleep ? ?REM behavior disorder is a diagnosis that does not require sleep study ?-Maintenance of a safe environment ?-A muscle relaxant may help ? ?Continue trazodone ?-This appears to be helping symptoms at present ? ?Multiple sclerosis may also  contribute to sleep issues-does not appear to be so at present ? ?She does snore, denies significant daytime symptoms-negative study in the past ? ?Tentative follow-up in about 3 to 4 months ? ?Records from primary doctor

## 2021-06-18 NOTE — Telephone Encounter (Signed)
Pt request refill for pregabalin (LYRICA) [facility] MG capsule [id number] 

## 2021-06-19 ENCOUNTER — Other Ambulatory Visit: Payer: Self-pay | Admitting: Family Medicine

## 2021-06-19 MED ORDER — MODAFINIL 100 MG PO TABS: 100.0000 mg | ORAL_TABLET | Freq: Every day | ORAL | 1 refills | Status: DC

## 2021-06-19 NOTE — Telephone Encounter (Signed)
Pt called needing to speak to the RN about the modafinil (PROVIGIL) tablet 100 mg that was to be ordered for her. Pt states she has not received it and would like to know if [facility] can be [facility] [address] ?

## 2021-06-19 NOTE — Telephone Encounter (Signed)
Modafinil was called into the patient. PA initiated on [facility] ?[id number] ?Will await response from insurance  ? ?Called the patient and made her aware of the script being sent in and waiting on auth pending. Pt verbalized understanding. ?Advised that we did attempt to send but after investigating it did not go through and we didn't get a response indicating it didn't go through. She was fine.  ?

## 2021-06-20 ENCOUNTER — Telehealth: Payer: Self-pay | Admitting: *Deleted

## 2021-06-20 NOTE — Telephone Encounter (Signed)
CB,RN submitted PA on [facility] 06/19/21. Key: [id number] - PA Case ID: [id number] - Rx #: [id number]. ? ?Request Reference Number: [id number]. MODAFINIL TAB 100MG  is approved through 12/20/2021. Your patient may now fill this prescription and it will be covered. ?

## 2021-06-25 ENCOUNTER — Other Ambulatory Visit: Payer: Self-pay | Admitting: Podiatry

## 2021-06-25 NOTE — Telephone Encounter (Signed)
Interaction between this drug and another medication patient currently taking( Prozac), please advise

## 2021-07-04 ENCOUNTER — Other Ambulatory Visit: Payer: Self-pay | Admitting: Family Medicine

## 2021-07-04 MED ORDER — FENTANYL 25 MCG/HR TD PT72: 1.0000 | MEDICATED_PATCH | TRANSDERMAL | 0 refills | Status: DC

## 2021-07-04 NOTE — Telephone Encounter (Signed)
Pt states she put on her last fentaNYL (DURAGESIC) 25 MCG/HR today and she will need to be able to pick up her Rx on Saturday due to going out of town next week. [phone]

## 2021-07-16 ENCOUNTER — Other Ambulatory Visit: Payer: Self-pay | Admitting: Family Medicine

## 2021-07-16 MED ORDER — FENTANYL 25 MCG/HR TD PT72: 1.0000 | MEDICATED_PATCH | TRANSDERMAL | 0 refills | Status: DC

## 2021-07-16 NOTE — Telephone Encounter (Signed)
Pt states before she went on vacation she was only given 5 of her fentaNYL ([place]) 25 MCG/HR, she is being told [facility] #[id number] to call our office to have her other 5 fentaNYL ([place]) 25 MCG/HR, called in

## 2021-07-16 NOTE — Telephone Encounter (Signed)
Reviewed pt chart. Rx was sent 07/04/21 #10. I called pharmacy at [phone]. Spoke w/ Freida Busman. They filled 5/10 d/t only have 5 in stock. Need new rx sent for qty 5 for remaining.   Checked drug registry. Filled 07/04/21 #5.

## 2021-08-03 ENCOUNTER — Other Ambulatory Visit: Payer: Self-pay | Admitting: Podiatry

## 2021-08-22 ENCOUNTER — Other Ambulatory Visit: Payer: Self-pay | Admitting: Family Medicine

## 2021-08-22 MED ORDER — FENTANYL 25 MCG/HR TD PT72: 1.0000 | MEDICATED_PATCH | TRANSDERMAL | 0 refills | Status: DC

## 2021-08-22 NOTE — Telephone Encounter (Signed)
Pt is requesting a refill on fentaNYL (DURAGESIC) 25 MCG/HR would [facility] [phone]

## 2021-08-22 NOTE — Telephone Encounter (Signed)
Reviewed drug registry. Last refilled 07/20/21 #10. Last seen 04/05/21 and next f/u 10/11/21.

## 2021-09-04 ENCOUNTER — Other Ambulatory Visit: Payer: Self-pay | Admitting: Internal Medicine

## 2021-09-04 ENCOUNTER — Other Ambulatory Visit: Payer: Self-pay | Admitting: Podiatry

## 2021-09-04 DIAGNOSIS — G35 Multiple sclerosis: Secondary | ICD-10-CM | POA: Diagnosis not present

## 2021-09-04 DIAGNOSIS — K3184 Gastroparesis: Secondary | ICD-10-CM | POA: Diagnosis not present

## 2021-09-04 DIAGNOSIS — M87059 Idiopathic aseptic necrosis of unspecified femur: Secondary | ICD-10-CM | POA: Diagnosis not present

## 2021-09-04 DIAGNOSIS — K219 Gastro-esophageal reflux disease without esophagitis: Secondary | ICD-10-CM | POA: Diagnosis not present

## 2021-09-04 DIAGNOSIS — Z1231 Encounter for screening mammogram for malignant neoplasm of breast: Secondary | ICD-10-CM

## 2021-09-04 DIAGNOSIS — K5900 Constipation, unspecified: Secondary | ICD-10-CM | POA: Diagnosis not present

## 2021-09-04 DIAGNOSIS — J301 Allergic rhinitis due to pollen: Secondary | ICD-10-CM | POA: Diagnosis not present

## 2021-09-04 NOTE — Telephone Encounter (Signed)
Left a detailed voicemail for the patient advising her that the meloxicam is not for [facility] Cobb term use, and the voltaren gel can be used as an alternative. Once the patient calls back I will let you know her response.

## 2021-09-04 NOTE — Telephone Encounter (Signed)
Can you see if they still need this medication? I don't want to be on this long term. Can also use voltaren gel. Thanks.

## 2021-09-13 ENCOUNTER — Telehealth: Payer: Self-pay | Admitting: Family Medicine

## 2021-09-13 NOTE — Telephone Encounter (Signed)
Reviewed pt chart. Rx last refilled 06/18/21 #90, [facility] Should have one refill remaining. I called pt and she verbalized understanding. She will contact pharmacy to process refill.

## 2021-09-13 NOTE — Telephone Encounter (Signed)
Pt request refill for pregabalin (LYRICA) [facility] MG capsule [id number] 

## 2021-09-20 ENCOUNTER — Other Ambulatory Visit: Payer: Self-pay | Admitting: Family Medicine

## 2021-09-20 MED ORDER — FENTANYL 25 MCG/HR TD PT72: 1.0000 | MEDICATED_PATCH | TRANSDERMAL | 0 refills | Status: DC

## 2021-09-20 NOTE — Telephone Encounter (Signed)
Pt is calling requesting a refill on fentaNYL (DURAGESIC) 25 MCG/HR. Pt is requesting [phone].

## 2021-09-20 NOTE — Telephone Encounter (Signed)
[place] drug registry has been verified. Last refill was 08/22/2021 # 10 for a 30 day supply. Last f/u 04/05/2021 with Amy, NP.

## 2021-10-11 ENCOUNTER — Telehealth: Payer: Self-pay | Admitting: Neurology

## 2021-10-11 ENCOUNTER — Ambulatory Visit (INDEPENDENT_AMBULATORY_CARE_PROVIDER_SITE_OTHER): Payer: Medicare Other | Admitting: Neurology

## 2021-10-11 ENCOUNTER — Encounter: Payer: Self-pay | Admitting: Neurology

## 2021-10-11 VITALS — BP 124/86 | HR 83 | Ht 65.0 in | Wt 271.0 lb

## 2021-10-11 DIAGNOSIS — Z79891 Long term (current) use of opiate analgesic: Secondary | ICD-10-CM | POA: Diagnosis not present

## 2021-10-11 DIAGNOSIS — G35 Multiple sclerosis: Secondary | ICD-10-CM

## 2021-10-11 DIAGNOSIS — G8929 Other chronic pain: Secondary | ICD-10-CM

## 2021-10-11 DIAGNOSIS — M25551 Pain in right hip: Secondary | ICD-10-CM | POA: Diagnosis not present

## 2021-10-11 DIAGNOSIS — R5382 Chronic fatigue, unspecified: Secondary | ICD-10-CM | POA: Diagnosis not present

## 2021-10-11 DIAGNOSIS — M25552 Pain in left hip: Secondary | ICD-10-CM

## 2021-10-11 DIAGNOSIS — Z79899 Other long term (current) drug therapy: Secondary | ICD-10-CM | POA: Diagnosis not present

## 2021-10-11 DIAGNOSIS — R260 Ataxic gait: Secondary | ICD-10-CM

## 2021-10-11 DIAGNOSIS — G894 Chronic pain syndrome: Secondary | ICD-10-CM | POA: Diagnosis not present

## 2021-10-11 NOTE — Telephone Encounter (Signed)
[employer] medicare NPR sent to GI 
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
## 2021-10-12 LAB — CBC WITH DIFFERENTIAL/PLATELET
Basophils Absolute: 0 10*3/uL (ref 0.0–0.2)
Basos: 0 %
EOS (ABSOLUTE): 0.1 10*3/uL (ref 0.0–0.4)
Eos: 1 %
Hematocrit: 32 % — ABNORMAL LOW (ref 34.0–46.6)
Hemoglobin: 11 g/dL — ABNORMAL LOW (ref 11.1–15.9)
Immature Grans (Abs): 0 10*3/uL (ref 0.0–0.1)
Immature Granulocytes: 0 %
Lymphocytes Absolute: 2.3 10*3/uL (ref 0.7–3.1)
Lymphs: 43 %
MCH: 29.3 pg (ref 26.6–33.0)
MCHC: 34.4 g/dL (ref 31.5–35.7)
MCV: 85 fL (ref 79–97)
Monocytes Absolute: 0.4 10*3/uL (ref 0.1–0.9)
Monocytes: 8 %
Neutrophils Absolute: 2.6 10*3/uL (ref 1.4–7.0)
Neutrophils: 48 %
Platelets: 187 10*3/uL (ref 150–450)
RBC: 3.75 x10E6/uL — ABNORMAL LOW (ref 3.77–5.28)
RDW: 12.8 % (ref 11.7–15.4)
WBC: 5.4 10*3/uL (ref 3.4–10.8)

## 2021-10-12 LAB — COMPREHENSIVE METABOLIC PANEL
ALT: 14 IU/L (ref 0–32)
AST: 14 IU/L (ref 0–40)
Albumin/Globulin Ratio: 1.3 (ref 1.2–2.2)
Albumin: 4 g/dL (ref 3.9–4.9)
Alkaline Phosphatase: 65 IU/L (ref 44–121)
BUN/Creatinine Ratio: 14 (ref 9–23)
BUN: 14 mg/dL (ref 6–24)
Bilirubin Total: 0.2 mg/dL (ref 0.0–1.2)
CO2: 23 mmol/L (ref 20–29)
Calcium: 9.5 mg/dL (ref 8.7–10.2)
Chloride: 106 mmol/L (ref 96–106)
Creatinine, Ser: 0.97 mg/dL (ref 0.57–1.00)
Globulin, Total: 3 g/dL (ref 1.5–4.5)
Glucose: 85 mg/dL (ref 70–99)
Potassium: 4.1 mmol/L (ref 3.5–5.2)
Sodium: 142 mmol/L (ref 134–144)
Total Protein: 7 g/dL (ref 6.0–8.5)
eGFR: 75 mL/min/{1.73_m2} (ref >59)

## 2021-10-13 LAB — DRUG SCREEN, URINE
Amphetamines, Urine: NEGATIVE ng/mL
Barbiturate screen, urine: NEGATIVE ng/mL
Benzodiazepine Quant, Ur: NEGATIVE ng/mL
Cannabinoid Quant, Ur: POSITIVE ng/mL — AB
Cocaine (Metab.): NEGATIVE ng/mL
Opiate Quant, Ur: NEGATIVE ng/mL
PCP Quant, Ur: NEGATIVE ng/mL

## 2021-10-15 ENCOUNTER — Other Ambulatory Visit: Payer: Self-pay | Admitting: Neurology

## 2021-10-15 DIAGNOSIS — G35 Multiple sclerosis: Secondary | ICD-10-CM

## 2021-10-16 ENCOUNTER — Other Ambulatory Visit: Payer: Self-pay | Admitting: Neurology

## 2021-10-16 DIAGNOSIS — G35 Multiple sclerosis: Secondary | ICD-10-CM

## 2021-10-22 ENCOUNTER — Other Ambulatory Visit: Payer: Self-pay | Admitting: Neurology

## 2021-10-24 DIAGNOSIS — K3184 Gastroparesis: Secondary | ICD-10-CM | POA: Diagnosis not present

## 2021-10-24 DIAGNOSIS — Z0001 Encounter for general adult medical examination with abnormal findings: Secondary | ICD-10-CM | POA: Diagnosis not present

## 2021-10-24 DIAGNOSIS — G35 Multiple sclerosis: Secondary | ICD-10-CM | POA: Diagnosis not present

## 2021-10-24 DIAGNOSIS — K5900 Constipation, unspecified: Secondary | ICD-10-CM | POA: Diagnosis not present

## 2021-10-24 DIAGNOSIS — R3915 Urgency of urination: Secondary | ICD-10-CM | POA: Diagnosis not present

## 2021-10-24 DIAGNOSIS — M87059 Idiopathic aseptic necrosis of unspecified femur: Secondary | ICD-10-CM | POA: Diagnosis not present

## 2021-10-24 DIAGNOSIS — J301 Allergic rhinitis due to pollen: Secondary | ICD-10-CM | POA: Diagnosis not present

## 2021-10-24 DIAGNOSIS — R3129 Other microscopic hematuria: Secondary | ICD-10-CM | POA: Diagnosis not present

## 2021-10-24 DIAGNOSIS — K219 Gastro-esophageal reflux disease without esophagitis: Secondary | ICD-10-CM | POA: Diagnosis not present

## 2021-10-25 MED ORDER — FENTANYL 25 MCG/HR TD PT72: 1.0000 | MEDICATED_PATCH | TRANSDERMAL | 0 refills | Status: DC

## 2021-10-25 NOTE — Telephone Encounter (Signed)
Last visit: 10/11/21 Next visit: 04/11/22  Per [place] registry, last filled #10 Fentanyl 25 mg/hr patches on 09/20/2021. Rx refill sent to Dr Felecia Shelling.

## 2021-10-27 ENCOUNTER — Other Ambulatory Visit: Payer: Medicare Other

## 2021-11-09 ENCOUNTER — Ambulatory Visit
Admission: RE | Admit: 2021-11-09 | Discharge: 2021-11-09 | Disposition: A | Discharge status: Home / Self Care | Payer: Medicare Other | Source: Ambulatory Visit | Attending: Neurology | Admitting: Neurology

## 2021-11-09 DIAGNOSIS — G35 Multiple sclerosis: Secondary | ICD-10-CM | POA: Diagnosis not present

## 2021-11-09 DIAGNOSIS — R260 Ataxic gait: Secondary | ICD-10-CM

## 2021-11-09 MED ORDER — GADOPICLENOL 0.5 MMOL/ML IV SOLN
10.0000 mL | Freq: Once | INTRAVENOUS | Status: AC | PRN
Start: 2021-11-09 — End: 2021-11-09
  Administered 2021-11-09: 10 mL via INTRAVENOUS

## 2021-12-06 ENCOUNTER — Other Ambulatory Visit: Payer: Self-pay | Admitting: Neurology

## 2021-12-06 MED ORDER — FENTANYL 25 MCG/HR TD PT72: 1.0000 | MEDICATED_PATCH | TRANSDERMAL | 0 refills | Status: DC

## 2021-12-06 NOTE — Telephone Encounter (Signed)
I have routed this request to Dr Sater for review. The pt is due for the medication and [place] registry was verified.  

## 2021-12-06 NOTE — Telephone Encounter (Signed)
Pt is requesting a refill for fentaNYL (DURAGESIC) 25 MCG/HR .  [id number]

## 2021-12-17 ENCOUNTER — Ambulatory Visit
Admission: RE | Admit: 2021-12-17 | Discharge: 2021-12-17 | Disposition: A | Discharge status: Home / Self Care | Payer: Medicare Other | Source: Ambulatory Visit | Attending: Internal Medicine | Admitting: Internal Medicine

## 2021-12-17 DIAGNOSIS — Z1231 Encounter for screening mammogram for malignant neoplasm of breast: Secondary | ICD-10-CM | POA: Diagnosis not present

## 2021-12-24 ENCOUNTER — Other Ambulatory Visit: Payer: Self-pay | Admitting: Neurology

## 2021-12-24 MED ORDER — PREGABALIN 150 MG PO CAPS: ORAL_CAPSULE | ORAL | 1 refills | Status: DC

## 2021-12-24 NOTE — Telephone Encounter (Signed)
Pt is calling. Stated she needs a refill on medication pregabalin (LYRICA) 150 MG capsule. [phone] -

## 2022-01-02 ENCOUNTER — Other Ambulatory Visit: Payer: Self-pay | Admitting: Neurology

## 2022-01-02 MED ORDER — FENTANYL 25 MCG/HR TD PT72: 1.0000 | MEDICATED_PATCH | TRANSDERMAL | 0 refills | Status: DC

## 2022-01-02 NOTE — Telephone Encounter (Signed)
Pt is calling. Requesting a refill on medication fentaNYL (DURAGESIC) 25 MCG/HR. [phone]. Pt said she is going out town for a week and need a refill before leaving on 12/2.

## 2022-01-02 NOTE — Telephone Encounter (Signed)
Last seen 10/11/21 and next f/u 04/11/22. Per drug registry, last refilled 12/06/21 #10.

## 2022-01-31 ENCOUNTER — Other Ambulatory Visit: Payer: Self-pay | Admitting: Neurology

## 2022-01-31 NOTE — Telephone Encounter (Signed)
Pt is requesting a refill for fentaNYL (DURAGESIC) 25 MCG/HR .  [phone]

## 2022-02-05 MED ORDER — FENTANYL 25 MCG/HR TD PT72: 1.0000 | MEDICATED_PATCH | TRANSDERMAL | 0 refills | Status: DC

## 2022-02-05 NOTE — Addendum Note (Signed)
Addended by: Wyvonnia Lora on: 02/05/2022 07:40 AM   Modules accepted: Orders

## 2022-02-05 NOTE — Telephone Encounter (Signed)
Checked drug registry. Last refilled 01/04/22 #10.  Last seen 10/11/21 and next f/u 04/11/22

## 2022-02-21 ENCOUNTER — Telehealth: Payer: Self-pay | Admitting: Neurology

## 2022-02-21 NOTE — Telephone Encounter (Signed)
Submitted PA for brand Tecfidera on covermymeds. Key: [id number]. Waiting on determination from [employer].

## 2022-02-21 NOTE — Telephone Encounter (Signed)
[facility]am[employer] [employer]  [phone] option 2 reports that TECFIDERA 240 MG CPDR brand is not covered by insurance, Generic is covered.  Brand will require a PA, please call.

## 2022-02-22 NOTE — Telephone Encounter (Signed)
"  Request Reference Number: [id number]. TECFIDERA CAP 240MG  is approved through 02/04/2023. Your patient may now fill this prescription and it will be covered."

## 2022-03-06 ENCOUNTER — Other Ambulatory Visit: Payer: Self-pay | Admitting: Neurology

## 2022-03-06 MED ORDER — FENTANYL 25 MCG/HR TD PT72: 1.0000 | MEDICATED_PATCH | TRANSDERMAL | 0 refills | Status: DC

## 2022-03-06 NOTE — Telephone Encounter (Signed)
Last seen 10/11/21 and next f/u 04/11/22. Per drug registry, last refilled 02/05/22 #10.

## 2022-03-06 NOTE — Telephone Encounter (Signed)
Pt is needing her fentaNYL (DURAGESIC) 25 MCG/HR [facility] [address]

## 2022-03-13 ENCOUNTER — Encounter: Payer: Self-pay | Admitting: Internal Medicine

## 2022-03-13 ENCOUNTER — Ambulatory Visit (INDEPENDENT_AMBULATORY_CARE_PROVIDER_SITE_OTHER): Payer: 59 | Admitting: Internal Medicine

## 2022-03-13 VITALS — BP 124/80 | HR 72 | Temp 98.2°F | Ht 65.0 in | Wt 265.0 lb

## 2022-03-13 DIAGNOSIS — R202 Paresthesia of skin: Secondary | ICD-10-CM

## 2022-03-13 DIAGNOSIS — Z79899 Other long term (current) drug therapy: Secondary | ICD-10-CM

## 2022-03-13 DIAGNOSIS — G35 Multiple sclerosis: Secondary | ICD-10-CM | POA: Diagnosis not present

## 2022-03-13 DIAGNOSIS — F321 Major depressive disorder, single episode, moderate: Secondary | ICD-10-CM | POA: Diagnosis not present

## 2022-03-13 DIAGNOSIS — Z6841 Body Mass Index (BMI) 40.0 and over, adult: Secondary | ICD-10-CM

## 2022-03-13 DIAGNOSIS — K3184 Gastroparesis: Secondary | ICD-10-CM

## 2022-03-13 DIAGNOSIS — Z7689 Persons encountering health services in other specified circumstances: Secondary | ICD-10-CM

## 2022-03-13 DIAGNOSIS — G35D Multiple sclerosis, unspecified: Secondary | ICD-10-CM

## 2022-03-13 DIAGNOSIS — E66813 Obesity, class 3: Secondary | ICD-10-CM

## 2022-03-13 MED ORDER — PANTOPRAZOLE SODIUM 40 MG PO TBEC: 40.0000 mg | DELAYED_RELEASE_TABLET | Freq: Every day | ORAL | 1 refills | Status: DC

## 2022-03-13 MED ORDER — DROSPIRENONE-ETHINYL ESTRADIOL 3-0.02 MG PO TABS: 1.0000 | ORAL_TABLET | Freq: Every day | ORAL | 2 refills | Status: DC

## 2022-03-13 MED ORDER — FLUOXETINE HCL 40 MG PO CAPS: ORAL_CAPSULE | ORAL | 1 refills | Status: DC

## 2022-03-13 MED ORDER — HYDROXYZINE HCL 25 MG PO TABS: 25.0000 mg | ORAL_TABLET | Freq: Every day | ORAL | 5 refills | Status: DC

## 2022-03-13 MED ORDER — METOCLOPRAMIDE HCL 5 MG PO TABS: ORAL_TABLET | ORAL | 3 refills | Status: DC

## 2022-03-13 MED ORDER — MELOXICAM 15 MG PO TABS: 15.0000 mg | ORAL_TABLET | Freq: Every day | ORAL | 2 refills | Status: DC | PRN

## 2022-03-13 MED ORDER — LINACLOTIDE 290 MCG PO CAPS: 290.0000 ug | ORAL_CAPSULE | Freq: Every day | ORAL | 3 refills | Status: DC

## 2022-03-13 MED ORDER — TRAZODONE HCL 100 MG PO TABS: 100.0000 mg | ORAL_TABLET | Freq: Every day | ORAL | 1 refills | Status: DC

## 2022-03-13 NOTE — Patient Instructions (Signed)
Obesity, Adult ?Obesity is having too much body fat. Being obese means that your weight is more than what is healthy for you.  ?BMI (body mass index) is a number that explains how much body fat you have. If you have a BMI of 30 or more, you are obese. ?Obesity can cause serious health problems, such as: ?Stroke. ?Coronary artery disease (CAD). ?Type 2 diabetes. ?Some types of cancer. ?High blood pressure (hypertension). ?High cholesterol. ?Gallbladder stones. ?Obesity can also contribute to: ?Osteoarthritis. ?Sleep apnea. ?Infertility problems. ?What are the causes? ?Eating meals each day that are high in calories, sugar, and fat. ?Drinking a lot of drinks that have sugar in them. ?Being born with genes that may make you more likely to become obese. ?Having a medical condition that causes obesity. ?Taking certain medicines. ?Sitting a lot (having a sedentary lifestyle). ?Not getting enough sleep. ?What increases the risk? ?Having a family history of obesity. ?Living in an area with limited access to: ?Parks, recreation centers, or sidewalks. ?Healthy food choices, such as grocery stores and farmers' markets. ?What are the signs or symptoms? ?The main sign is having too much body fat. ?How is this treated? ?Treatment for this condition often includes changing your lifestyle. Treatment may include: ?Changing your diet. This may include making a healthy meal plan. ?Exercise. This may include activity that causes your heart to beat faster (aerobic exercise) and strength training. Work with your doctor to design a program that works for you. ?Medicine to help you lose weight. This may be used if you are not able to lose one pound a week after 6 weeks of healthy eating and more exercise. ?Treating conditions that cause the obesity. ?Surgery. Options may include gastric banding and gastric bypass. This may be done if: ?Other treatments have not helped to improve your condition. ?You have a BMI of 40 or higher. ?You have  life-threatening health problems related to obesity. ?Follow these instructions at home: ?Eating and drinking ? ?Follow advice from your doctor about what to eat and drink. Your doctor may tell you to: ?Limit fast food, sweets, and processed snack foods. ?Choose low-fat options. For example, choose low-fat milk instead of whole milk. ?Eat five or more servings of fruits or vegetables each day. ?Eat at home more often. This gives you more control over what you eat. ?Choose healthy foods when you eat out. ?Learn to read food labels. This will help you learn how much food is in one serving. ?Keep low-fat snacks available. ?Avoid drinks that have a lot of sugar in them. These include soda, fruit juice, iced tea with sugar, and flavored milk. ?Drink enough water to keep your pee (urine) pale yellow. ?Do not go on fad diets. ?Physical activity ?Exercise often, as told by your doctor. Most adults should get up to 150 minutes of moderate-intensity exercise every week.Ask your doctor: ?What types of exercise are safe for you. ?How often you should exercise. ?Warm up and stretch before being active. ?Do slow stretching after being active (cool down). ?Rest between times of being active. ?Lifestyle ?Work with your doctor and a food expert (dietitian) to set a weight-loss goal that is best for you. ?Limit your screen time. ?Find ways to reward yourself that do not involve food. ?Do not drink alcohol if: ?Your doctor tells you not to drink. ?You are pregnant, may be pregnant, or are planning to become pregnant. ?If you drink alcohol: ?Limit how much you have to: ?0-1 drink a day for women. ?0-2 drinks   a day for men. ?Know how much alcohol is in your drink. In the [place] one drink equals one 12 oz bottle of beer (355 mL), one 5 oz glass of wine (148 mL), or one 1? oz glass of hard liquor (44 mL). ?General instructions ?Keep a weight-loss journal. This can help you keep track of: ?The food that you eat. ?How much exercise you  get. ?Take over-the-counter and prescription medicines only as told by your doctor. ?Take vitamins and supplements only as told by your doctor. ?Think about joining a support group. ?Pay attention to your mental health as obesity can lead to depression or self esteem issues. ?Keep all follow-up visits. ?Contact a doctor if: ?You cannot meet your weight-loss goal after you have changed your diet and lifestyle for 6 weeks. ?You are having trouble breathing. ?Summary ?Obesity is having too much body fat. ?Being obese means that your weight is more than what is healthy for you. ?Work with your doctor to set a weight-loss goal. ?Get regular exercise as told by your doctor. ?This information is not intended to replace advice given to you by your health care provider. Make sure you discuss any questions you have with your health care provider. ?Document Revised: 08/29/2020 Document Reviewed: 08/29/2020 ?Elsevier Patient Education ? 2023 [employer] ? ?

## 2022-03-13 NOTE — Progress Notes (Signed)
I,Victoria T Hamilton,acting as a scribe for Maximino Greenland, MD.,have documented all relevant documentation on the behalf of Maximino Greenland, MD,as directed by  Maximino Greenland, MD while in the presence of Maximino Greenland, MD.    Subjective:     Patient ID: Joy Cobb , female    DOB: 09-18-1980 , 42 y.o.   MRN: [id number]   Chief Complaint  Patient presents with   Establish Care         HPI  Pt presents today to establish care. She is self-referred, a Facebook group suggested that she come here. She was previously seen at [facility], but does not feel they cared about preventative health. She states they treated her for seasonal allergies, gastroparesis, GERD and insomnia. She also reports having MS, treated by Dr. Felecia Shelling. She is not followed by GYN.   She is single, without children. She has had some college. She is disabled due to [place]. She works Surveyor, mining on the side.       Past Medical History:  Diagnosis Date   Bladder spasms    Colonic inertia    GERD (gastroesophageal reflux disease)    IBS (irritable bowel syndrome)    Insomnia    Multiple sclerosis (HCC)    Neuropathy    Vision abnormalities      Family History  Problem Relation Age of Onset   Neuropathy Mother    Allergic rhinitis Mother    Urticaria Mother    GER disease Mother    Cancer - Other Father    Thyroid disease Maternal Aunt    Breast cancer Maternal Aunt    Parkinson's disease Maternal Aunt    Diabetes Maternal Grandmother    Hypertension Maternal Grandmother    Kidney failure Maternal Grandmother    Parkinson's disease Maternal Grandmother    Kidney disease Maternal Grandfather    Hyperlipidemia Paternal Grandmother    Hyperlipidemia Paternal Grandfather    Colon cancer Other    Asthma Neg Hx    Eczema Neg Hx    Esophageal cancer Neg Hx    Rectal cancer Neg Hx    Stomach cancer Neg Hx      Current Outpatient Medications:    AFLURIA QUADRIVALENT 0.5 ML injection, , Disp:  , Rfl:    albuterol (VENTOLIN HFA) 108 (90 Base) MCG/ACT inhaler, Inhale 2 puffs into the lungs every 6 (six) hours as needed for wheezing or shortness of breath., Disp: 2 each, Rfl: 2   baclofen (LIORESAL) 10 MG tablet, Take 1 tablet (10 mg total) by mouth 3 (three) times daily., Disp: 270 tablet, Rfl: 3   cetirizine (ZYRTEC) 10 MG tablet, Take 10 mg by mouth daily., Disp: , Rfl:    [facility] TUSSIN DM MAX ST 20-400 MG/20ML LIQD, Take by mouth., Disp: , Rfl:    fentaNYL (DURAGESIC) 25 MCG/HR, Place 1 patch onto the skin every 3 (three) days., Disp: 10 patch, Rfl: 0   nystatin cream (MYCOSTATIN), Apply topically 2 (two) times daily., Disp: , Rfl:    pregabalin (LYRICA) 150 MG capsule, TAKE ONE CAPSULE BY MOUTH AT BEDTIME, Disp: 90 capsule, Rfl: 1   sucralfate (CARAFATE) 1 GM/10ML suspension, , Disp: , Rfl:    TECFIDERA 240 MG CPDR, TAKE 1 CAPSULE (240MG) BY MOUTH  TWICE DAILY, Disp: 180 capsule, Rfl: 2   traZODone (DESYREL) 100 MG tablet, Take 1 tablet (100 mg total) by mouth at bedtime., Disp: 90 tablet, Rfl: 1   traZODone (DESYREL) 100  MG tablet, 1/2-1 TAB orally AT BEDTIME, Disp: , Rfl:    drospirenone-ethinyl estradiol (YAZ) 3-0.02 MG tablet, Take 1 tablet by mouth daily., Disp: 3 tablet, Rfl: 2   FLUoxetine (PROZAC) 40 MG capsule, One capsule po qd, Disp: 90 capsule, Rfl: 1   hydrOXYzine (ATARAX) 25 MG tablet, Take 1 tablet (25 mg total) by mouth at bedtime., Disp: 30 tablet, Rfl: 5   linaclotide (LINZESS) 290 MCG CAPS capsule, Take 1 capsule (290 mcg total) by mouth daily before breakfast., Disp: 30 capsule, Rfl: 3   meloxicam (MOBIC) 15 MG tablet, Take 1 tablet (15 mg total) by mouth daily as needed (For hip pain flare ups)., Disp: 30 tablet, Rfl: 2   metoCLOPramide (REGLAN) 5 MG tablet, Take 2 tabs po bid, Disp: 120 tablet, Rfl: 3   pantoprazole (PROTONIX) 40 MG tablet, Take 1 tablet (40 mg total) by mouth daily., Disp: 90 tablet, Rfl: 1   sucralfate (CARAFATE) 1 GM/10ML suspension, 10 ml orally  4 times a day (before meals and at bedtime) PRN for 30 days, Disp: , Rfl:    Vitamin D, Ergocalciferol, (DRISDOL) 50000 units CAPS capsule, Take 50,000 Units by mouth 2 (two) times a week.  (Patient not taking: Reported on 03/13/2022), Disp: , Rfl:    Allergies  Allergen Reactions   Penicillins Hives   Temazepam Nausea Only    Sharp pains in stomach   Amoxicillin Rash   Septra [Sulfamethoxazole-Trimethoprim] Rash     Review of Systems  Constitutional: Negative.   Respiratory: Negative.    Cardiovascular: Negative.   Neurological:  Positive for numbness.       She admits to having memory issues.   Psychiatric/Behavioral: Negative.       Today's Vitals   03/13/22 1442  BP: 124/80  Pulse: 72  Temp: 98.2 F (36.8 C)  TempSrc: Oral  Weight: 265 lb (120.2 kg)  Height: 5' 5"$  (1.651 m)   Body mass index is 44.1 kg/m.   Wt Readings from Last 3 Encounters:  03/13/22 265 lb (120.2 kg)  10/11/21 271 lb (122.9 kg)  06/18/21 257 lb 12.8 oz (116.9 kg)     Objective:  Physical Exam Vitals and nursing note reviewed.  Constitutional:      Appearance: Normal appearance. She is obese.  HENT:     Head: Normocephalic and atraumatic.     Nose:     Comments: Masked     Mouth/Throat:     Comments: Masked  Eyes:     Extraocular Movements: Extraocular movements intact.  Cardiovascular:     Rate and Rhythm: Normal rate and regular rhythm.     Heart sounds: Normal heart sounds.  Pulmonary:     Effort: Pulmonary effort is normal.     Breath sounds: Normal breath sounds.  Musculoskeletal:     Cervical back: Normal range of motion.  Skin:    General: Skin is warm.  Neurological:     General: No focal deficit present.     Mental Status: She is alert.  Psychiatric:        Mood and Affect: Mood normal.        Behavior: Behavior normal.      Assessment And Plan:     1. Multiple sclerosis ([place]) Comments: Chronic, Neuro notes reviewed. She is encouraged to follow anti-inflammatory  diet. She will c/w Tecfidera and Lyrica.  2. Paresthesia of both feet Comments: Chronic, likely related to MS. However, I will check labs as below. She will c/w Lyrica  as per Neuro. - Hemoglobin A1c - CBC no Diff - Vitamin B12 - TSH - CMP14+EGFR  3. Nondiabetic gastroparesis Comments: Chronic, advised to eat small, frequent meals throughout the day. She should definitely stop eating 3 hrs prior to lying down.  4. Current moderate episode of major depressive disorder without prior episode ([place]) Comments: Chronic, currently on fluoxetine 73m daily. She agrees to referral for therapy. - Ambulatory referral to Psychology  5. Class 3 severe obesity due to excess calories with serious comorbidity and body mass index (BMI) of 40.0 to 44.9 in adult ([facility] Comments: BMI 44. She is encouraged to aim for at least 150 minutes of exercise/week, while initially striving for BIM<39 to decrease cardiac risk.  6. Drug therapy - Lipid panel  7. Encounter to establish care with new doctor   Patient was given opportunity to ask questions. Patient verbalized understanding of the plan and was able to repeat key elements of the plan. All questions were answered to their satisfaction.   I, RMaximino Greenland MD, have reviewed all documentation for this visit. The documentation on 03/13/22 for the exam, diagnosis, procedures, and orders are all accurate and complete.   IF YOU HAVE BEEN REFERRED TO A SPECIALIST, IT MAY TAKE 1-2 WEEKS TO SCHEDULE/PROCESS THE REFERRAL. IF YOU HAVE NOT HEARD FROM US/SPECIALIST IN TWO WEEKS, PLEASE GIVE [place] CALL AT [phone].   THE PATIENT IS ENCOURAGED TO PRACTICE SOCIAL DISTANCING DUE TO THE COVID-19 PANDEMIC.

## 2022-03-14 LAB — CMP14+EGFR
ALT: 14 IU/L (ref 0–32)
AST: 13 IU/L (ref 0–40)
Albumin/Globulin Ratio: 1.1 — ABNORMAL LOW (ref 1.2–2.2)
Albumin: 3.8 g/dL — ABNORMAL LOW (ref 3.9–4.9)
Alkaline Phosphatase: 72 IU/L (ref 44–121)
BUN/Creatinine Ratio: 8 — ABNORMAL LOW (ref 9–23)
BUN: 8 mg/dL (ref 6–24)
Bilirubin Total: 0.3 mg/dL (ref 0.0–1.2)
CO2: 22 mmol/L (ref 20–29)
Calcium: 9.3 mg/dL (ref 8.7–10.2)
Chloride: 103 mmol/L (ref 96–106)
Creatinine, Ser: 1.06 mg/dL — ABNORMAL HIGH (ref 0.57–1.00)
Globulin, Total: 3.4 g/dL (ref 1.5–4.5)
Glucose: 84 mg/dL (ref 70–99)
Potassium: 3.8 mmol/L (ref 3.5–5.2)
Sodium: 139 mmol/L (ref 134–144)
Total Protein: 7.2 g/dL (ref 6.0–8.5)
eGFR: 67 mL/min/{1.73_m2} (ref >59)

## 2022-03-14 LAB — CBC
Hematocrit: 33.6 % — ABNORMAL LOW (ref 34.0–46.6)
Hemoglobin: 11.4 g/dL (ref 11.1–15.9)
MCH: 28.7 pg (ref 26.6–33.0)
MCHC: 33.9 g/dL (ref 31.5–35.7)
MCV: 85 fL (ref 79–97)
Platelets: 232 10*3/uL (ref 150–450)
RBC: 3.97 x10E6/uL (ref 3.77–5.28)
RDW: 13.1 % (ref 11.7–15.4)
WBC: 5.3 10*3/uL (ref 3.4–10.8)

## 2022-03-14 LAB — LIPID PANEL
Chol/HDL Ratio: 2.6 ratio (ref 0.0–4.4)
Cholesterol, Total: 209 mg/dL — ABNORMAL HIGH (ref 100–199)
HDL: 80 mg/dL (ref >39)
LDL Chol Calc (NIH): 104 mg/dL — ABNORMAL HIGH (ref 0–99)
Triglycerides: 144 mg/dL (ref 0–149)
VLDL Cholesterol Cal: 25 mg/dL (ref 5–40)

## 2022-03-14 LAB — HEMOGLOBIN A1C
Est. average glucose Bld gHb Est-mCnc: 103 mg/dL
Hgb A1c MFr Bld: 5.2 % (ref 4.8–5.6)

## 2022-03-14 LAB — TSH: TSH: 1.15 u[IU]/mL (ref 0.450–4.500)

## 2022-03-14 LAB — VITAMIN B12: Vitamin B-12: 632 pg/mL (ref 232–1245)

## 2022-03-18 ENCOUNTER — Encounter: Payer: Self-pay | Admitting: Internal Medicine

## 2022-03-18 DIAGNOSIS — R202 Paresthesia of skin: Secondary | ICD-10-CM | POA: Insufficient documentation

## 2022-03-18 DIAGNOSIS — K3184 Gastroparesis: Secondary | ICD-10-CM | POA: Insufficient documentation

## 2022-04-04 ENCOUNTER — Telehealth: Payer: Self-pay | Admitting: Neurology

## 2022-04-04 ENCOUNTER — Other Ambulatory Visit: Payer: Self-pay | Admitting: *Deleted

## 2022-04-04 MED ORDER — FENTANYL 25 MCG/HR TD PT72: 1.0000 | MEDICATED_PATCH | TRANSDERMAL | 0 refills | Status: DC

## 2022-04-04 NOTE — Telephone Encounter (Signed)
Pt request refill for fentaNYL (DURAGESIC) 25 MCG/HR at  [id number]

## 2022-04-04 NOTE — Telephone Encounter (Signed)
Dr. Felecia Shelling out, sent to work in [place], Dr. Brett Fairy to send in for pt.

## 2022-04-04 NOTE — Telephone Encounter (Signed)
Pt last seen 10/11/21 and next f/u 04/11/22. Per drug registry, last refilled 03/06/22 #10.

## 2022-04-11 ENCOUNTER — Ambulatory Visit (INDEPENDENT_AMBULATORY_CARE_PROVIDER_SITE_OTHER): Payer: 59 | Admitting: Neurology

## 2022-04-11 ENCOUNTER — Encounter: Payer: Self-pay | Admitting: Neurology

## 2022-04-11 VITALS — BP 119/75 | HR 72 | Ht 65.0 in | Wt 265.0 lb

## 2022-04-11 DIAGNOSIS — G35 Multiple sclerosis: Secondary | ICD-10-CM

## 2022-04-11 DIAGNOSIS — Z79899 Other long term (current) drug therapy: Secondary | ICD-10-CM

## 2022-04-11 MED ORDER — PREGABALIN 150 MG PO CAPS: ORAL_CAPSULE | ORAL | 1 refills | Status: DC

## 2022-04-11 MED ORDER — BACLOFEN 10 MG PO TABS: 10.0000 mg | ORAL_TABLET | Freq: Three times a day (TID) | ORAL | 3 refills | Status: DC
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
## 2022-04-12 LAB — CBC WITH DIFFERENTIAL/PLATELET
Basophils Absolute: 0 10*3/uL (ref 0.0–0.2)
Basos: 0 %
EOS (ABSOLUTE): 0.1 10*3/uL (ref 0.0–0.4)
Eos: 2 %
Hematocrit: 32.9 % — ABNORMAL LOW (ref 34.0–46.6)
Hemoglobin: 11.3 g/dL (ref 11.1–15.9)
Immature Grans (Abs): 0 10*3/uL (ref 0.0–0.1)
Immature Granulocytes: 0 %
Lymphocytes Absolute: 2.2 10*3/uL (ref 0.7–3.1)
Lymphs: 40 %
MCH: 29 pg (ref 26.6–33.0)
MCHC: 34.3 g/dL (ref 31.5–35.7)
MCV: 84 fL (ref 79–97)
Monocytes Absolute: 0.3 10*3/uL (ref 0.1–0.9)
Monocytes: 6 %
Neutrophils Absolute: 2.8 10*3/uL (ref 1.4–7.0)
Neutrophils: 52 %
Platelets: 216 10*3/uL (ref 150–450)
RBC: 3.9 x10E6/uL (ref 3.77–5.28)
RDW: 13 % (ref 11.7–15.4)
WBC: 5.4 10*3/uL (ref 3.4–10.8)

## 2022-04-23 ENCOUNTER — Other Ambulatory Visit: Payer: Self-pay | Admitting: Neurology

## 2022-04-23 DIAGNOSIS — G35 Multiple sclerosis: Secondary | ICD-10-CM

## 2022-05-02 ENCOUNTER — Other Ambulatory Visit: Payer: Self-pay

## 2022-05-02 MED ORDER — SUCRALFATE 1 GM/10ML PO SUSP: 1.0000 g | Freq: Two times a day (BID) | ORAL | 0 refills | Status: DC

## 2022-05-03 ENCOUNTER — Telehealth: Payer: Self-pay | Admitting: Urgent Care

## 2022-05-03 ENCOUNTER — Ambulatory Visit
Admission: EM | Admit: 2022-05-03 | Discharge: 2022-05-03 | Disposition: A | Discharge status: Home / Self Care | Payer: 59

## 2022-05-03 DIAGNOSIS — L03213 Periorbital cellulitis: Secondary | ICD-10-CM

## 2022-05-03 MED ORDER — CEFDINIR 300 MG PO CAPS: 300.0000 mg | ORAL_CAPSULE | Freq: Two times a day (BID) | ORAL | 0 refills | Status: DC

## 2022-05-03 NOTE — ED Provider Notes (Signed)
[facility]  Note:  This document was prepared using Systems analyst and may include unintentional dictation errors.  MRN: [id number] DOB: [birth year]  Subjective:   CLARIE HEDRICH is a 42 y.o. female presenting for 4-day history of acute onset persistent left lower eyelid pain, swelling and slight drainage.  No vision changes, eye trauma, matting of the eyelashes.  No contact lens use.  Has not been sick recently.  No current facility-administered medications for this encounter.  Current Outpatient Medications:    AFLURIA QUADRIVALENT 0.5 ML injection, , Disp: , Rfl:    albuterol (VENTOLIN HFA) 108 (90 Base) MCG/ACT inhaler, Inhale 2 puffs into the lungs every 6 (six) hours as needed for wheezing or shortness of breath., Disp: 2 each, Rfl: 2   baclofen (LIORESAL) 10 MG tablet, Take 1 tablet (10 mg total) by mouth 3 (three) times daily., Disp: 270 tablet, Rfl: 3   cetirizine (ZYRTEC) 10 MG tablet, Take 10 mg by mouth daily., Disp: , Rfl:    [facility] TUSSIN DM MAX ST 20-400 MG/20ML LIQD, Take by mouth., Disp: , Rfl:    drospirenone-ethinyl estradiol (YAZ) 3-0.02 MG tablet, Take 1 tablet by mouth daily., Disp: 3 tablet, Rfl: 2   fentaNYL (DURAGESIC) 25 MCG/HR, Place 1 patch onto the skin every 3 (three) days., Disp: 10 patch, Rfl: 0   FLUoxetine (PROZAC) 40 MG capsule, One capsule po qd, Disp: 90 capsule, Rfl: 1   hydrOXYzine (ATARAX) 25 MG tablet, Take 1 tablet (25 mg total) by mouth at bedtime., Disp: 30 tablet, Rfl: 5   linaclotide (LINZESS) 290 MCG CAPS capsule, Take 1 capsule (290 mcg total) by mouth daily before breakfast., Disp: 30 capsule, Rfl: 3   meloxicam (MOBIC) 15 MG tablet, Take 1 tablet (15 mg total) by mouth daily as needed (For hip pain flare ups)., Disp: 30 tablet, Rfl: 2   metoCLOPramide (REGLAN) 5 MG tablet, Take 2 tabs po bid, Disp: 120 tablet, Rfl: 3   nystatin cream (MYCOSTATIN), Apply topically 2 (two) times daily., Disp: , Rfl:     pantoprazole (PROTONIX) 40 MG tablet, Take 1 tablet (40 mg total) by mouth daily., Disp: 90 tablet, Rfl: 1   pregabalin (LYRICA) 150 MG capsule, TAKE ONE CAPSULE BY MOUTH AT BEDTIME, Disp: 90 capsule, Rfl: 1   sucralfate (CARAFATE) 1 GM/10ML suspension, Take 10 mLs (1 g total) by mouth 2 (two) times daily. prn, Disp: 420 mL, Rfl: 0   TECFIDERA 240 MG CPDR, TAKE 1 CAPSULE (240MG ) BY MOUTH  TWICE DAILY, Disp: 180 capsule, Rfl: 5   traZODone (DESYREL) 100 MG tablet, Take 1 tablet (100 mg total) by mouth at bedtime., Disp: 90 tablet, Rfl: 1   Vitamin D, Ergocalciferol, (DRISDOL) 50000 units CAPS capsule, Take 50,000 Units by mouth 2 (two) times a week., Disp: , Rfl:    Allergies  Allergen Reactions   Penicillins Hives   Temazepam Nausea Only    Sharp pains in stomach   Amoxicillin Rash   Septra [Sulfamethoxazole-Trimethoprim] Rash    Past Medical History:  Diagnosis Date   Bladder spasms    Colonic inertia    GERD (gastroesophageal reflux disease)    IBS (irritable bowel syndrome)    Insomnia    Multiple sclerosis (HCC)    Neuropathy    Vision abnormalities      Past Surgical History:  Procedure Laterality Date   BREAST SURGERY Right 2005   CYSTECTOMY     polyp removal  UPPER GASTROINTESTINAL ENDOSCOPY      Family History  Problem Relation Age of Onset   Neuropathy Mother    Allergic rhinitis Mother    Urticaria Mother    GER disease Mother    Cancer - Other Father    Thyroid disease Maternal Aunt    Breast cancer Maternal Aunt    Parkinson's disease Maternal Aunt    Diabetes Maternal Grandmother    Hypertension Maternal Grandmother    Kidney failure Maternal Grandmother    Parkinson's disease Maternal Grandmother    Kidney disease Maternal Grandfather    Hyperlipidemia Paternal Grandmother    Hyperlipidemia Paternal Grandfather    Colon cancer Other    Asthma Neg Hx    Eczema Neg Hx    Esophageal cancer Neg Hx    Rectal cancer Neg Hx    Stomach cancer Neg  Hx     Social History   Tobacco Use   Smoking status: Some Days    Types: E-cigarettes    Start date: 02/05/2020    Passive exposure: Never   Smokeless tobacco: Never   Tobacco comments:    Vapes some but not on a regular basis. 06/18/21. Hsm.   Vaping Use   Vaping Use: Never used  Substance Use Topics   Alcohol use: Yes    Comment: socially   Drug use: No    ROS   Objective:   Vitals: BP 133/83 (BP Location: Right Arm)   Pulse 75   Temp 98.4 F (36.9 C) (Oral)   Resp 18   SpO2 98%   Physical Exam Constitutional:      General: She is not in acute distress.    Appearance: Normal appearance. She is well-developed. She is not ill-appearing, toxic-appearing or diaphoretic.  HENT:     Head: Normocephalic and atraumatic.     Nose: Nose normal.     Mouth/Throat:     Mouth: Mucous membranes are moist.  Eyes:     General: Lids are everted, no foreign bodies appreciated. Vision grossly intact. No scleral icterus.       Right eye: No foreign body, discharge or hordeolum.        Left eye: No foreign body, discharge or hordeolum.     Extraocular Movements: Extraocular movements intact.     Right eye: Normal extraocular motion.     Left eye: Normal extraocular motion and no nystagmus.     Conjunctiva/sclera:     Right eye: Right conjunctiva is not injected. No chemosis, exudate or hemorrhage.    Left eye: Left conjunctiva is not injected. No chemosis, exudate or hemorrhage.  Cardiovascular:     Rate and Rhythm: Normal rate.  Pulmonary:     Effort: Pulmonary effort is normal.  Skin:    General: Skin is warm and dry.  Neurological:     General: No focal deficit present.     Mental Status: She is alert and oriented to person, place, and time.  Psychiatric:        Mood and Affect: Mood normal.        Behavior: Behavior normal.     Assessment and Plan :   PDMP not reviewed this encounter.  1. Preseptal cellulitis of left lower eyelid     Recommend managing for  preseptal cellulitis with cefdinir.  Use supportive care otherwise.  No signs of an acute ophthalmologic emergency. Counseled patient on potential for adverse effects with medications prescribed/recommended today, ER and return-to-clinic precautions discussed, patient verbalized understanding.  Jaynee Eagles, PA-C 05/03/22 [id number]

## 2022-05-03 NOTE — ED Triage Notes (Signed)
Pt reports pain in left eye x 4 days; drainage and swelling in left eye since this morning.

## 2022-05-03 NOTE — Telephone Encounter (Signed)
Due to oversight, did not send prescription earlier.  These were sent right now.  Counseled patient on potential for adverse effects with medications prescribed/recommended today, ER and return-to-clinic precautions discussed, patient verbalized understanding.

## 2022-05-06 ENCOUNTER — Other Ambulatory Visit: Payer: Self-pay | Admitting: Neurology

## 2022-05-06 MED ORDER — FENTANYL 25 MCG/HR TD PT72: 1.0000 | MEDICATED_PATCH | TRANSDERMAL | 0 refills | Status: DC

## 2022-05-06 NOTE — Telephone Encounter (Signed)
Pt is requesting a refill for fentaNYL (DURAGESIC) 25 MCG/HR.  Pharmacy: [id number]  

## 2022-05-06 NOTE — Telephone Encounter (Signed)
Last seen 04/11/22 and next f/u 10/16/22. Last refilled 04/04/22 #10.

## 2022-05-08 ENCOUNTER — Ambulatory Visit (INDEPENDENT_AMBULATORY_CARE_PROVIDER_SITE_OTHER): Payer: 59

## 2022-05-08 VITALS — Ht 66.0 in | Wt 260.0 lb

## 2022-05-08 DIAGNOSIS — Z0000 Encounter for general adult medical examination without abnormal findings: Secondary | ICD-10-CM

## 2022-05-08 NOTE — Patient Instructions (Signed)
[name] , Thank you for taking time to come for your Medicare Wellness Visit. I appreciate your ongoing commitment to your health goals. Please review the following plan we discussed and let me know if I can assist you in the future.   These are the goals we discussed:  Goals      Patient Stated     05/08/2022, working on gastroparesis holistically        This is a list of the screening recommended for you and due dates:  Health Maintenance  Topic Date Due   HIV Screening  Never done   DTaP/Tdap/Td vaccine (1 - Tdap) Never done   Pap Smear  Never done   COVID-19 Vaccine (4 - 2023-24 season) 10/05/2021   Flu Shot  09/05/2022   Medicare Annual Wellness Visit  05/08/2023   Hepatitis C Screening: USPSTF Recommendation to screen - Ages 18-79 yo.  Completed   HPV Vaccine  Aged Out    Advanced directives: Advance directive discussed with you today.   Conditions/risks identified: none  Next appointment: Follow up in one year for your annual wellness visit.   Preventive Care 40-64 Years, Female Preventive care refers to lifestyle choices and visits with your health care provider that can promote health and wellness. What does preventive care include? A yearly physical exam. This is also called an annual well check. Dental exams once or twice a year. Routine eye exams. Ask your health care provider how often you should have your eyes checked. Personal lifestyle choices, including: Daily care of your teeth and gums. Regular physical activity. Eating a healthy diet. Avoiding tobacco and drug use. Limiting alcohol use. Practicing safe sex. Taking low-dose aspirin daily starting at age 36. Taking vitamin and mineral supplements as recommended by your health care provider. What happens during an annual well check? The services and screenings done by your health care provider during your annual well check will depend on your age, overall health, lifestyle risk factors, and family  history of disease. Counseling  Your health care provider may ask you questions about your: Alcohol use. Tobacco use. Drug use. Emotional well-being. Home and relationship well-being. Sexual activity. Eating habits. Work and work Statistician. Method of birth control. Menstrual cycle. Pregnancy history. Screening  You may have the following tests or measurements: Height, weight, and BMI. Blood pressure. Lipid and cholesterol levels. These may be checked every 5 years, or more frequently if you are over 24 years old. Skin check. Lung cancer screening. You may have this screening every year starting at age 39 if you have a 30-pack-year history of smoking and currently smoke or have quit within the past 15 years. Fecal occult blood test (FOBT) of the stool. You may have this test every year starting at age 7. Flexible sigmoidoscopy or colonoscopy. You may have a sigmoidoscopy every 5 years or a colonoscopy every 10 years starting at age 9. Hepatitis C blood test. Hepatitis B blood test. Sexually transmitted disease (STD) testing. Diabetes screening. This is done by checking your blood sugar (glucose) after you have not eaten for a while (fasting). You may have this done every 1-3 years. Mammogram. This may be done every 1-2 years. Talk to your health care provider about when you should start having regular mammograms. This may depend on whether you have a family history of breast cancer. BRCA-related cancer screening. This may be done if you have a family history of breast, ovarian, tubal, or peritoneal cancers. Pelvic exam and Pap test.  This may be done every 3 years starting at age 96. Starting at age 1, this may be done every 5 years if you have a Pap test in combination with an HPV test. Bone density scan. This is done to screen for osteoporosis. You may have this scan if you are at high risk for osteoporosis. Discuss your test results, treatment options, and if necessary, the need  for more tests with your health care provider. Vaccines  Your health care provider may recommend certain vaccines, such as: Influenza vaccine. This is recommended every year. Tetanus, diphtheria, and acellular pertussis (Tdap, Td) vaccine. You may need a Td booster every 10 years. Zoster vaccine. You may need this after age 30. Pneumococcal 13-valent conjugate (PCV13) vaccine. You may need this if you have certain conditions and were not previously vaccinated. Pneumococcal polysaccharide (PPSV23) vaccine. You may need one or two doses if you smoke cigarettes or if you have certain conditions. Talk to your health care provider about which screenings and vaccines you need and how often you need them. This information is not intended to replace advice given to you by your health care provider. Make sure you discuss any questions you have with your health care provider. Document Released: 02/17/2015 Document Revised: 10/11/2015 Document Reviewed: 11/22/2014 Elsevier Interactive Patient Education  2017 [place] Prevention in the Home Falls can cause injuries. They can happen to people of all ages. There are many things you can do to make your home safe and to help prevent falls. What can I do on the outside of my home? Regularly fix the edges of walkways and driveways and fix any cracks. Remove anything that might make you trip as you walk through a door, such as a raised step or threshold. Trim any bushes or trees on the path to your home. Use bright outdoor lighting. Clear any walking paths of anything that might make someone trip, such as rocks or tools. Regularly check to see if handrails are loose or broken. Make sure that both sides of any steps have handrails. Any raised decks and porches should have guardrails on the edges. Have any leaves, snow, or ice cleared regularly. Use sand or salt on walking paths during winter. Clean up any spills in your garage right away. This  includes oil or grease spills. What can I do in the bathroom? Use night lights. Install grab bars by the toilet and in the tub and shower. Do not use towel bars as grab bars. Use non-skid mats or decals in the tub or shower. If you need to sit down in the shower, use a plastic, non-slip stool. Keep the floor dry. Clean up any water that spills on the floor as soon as it happens. Remove soap buildup in the tub or shower regularly. Attach bath mats securely with double-sided non-slip rug tape. Do not have throw rugs and other things on the floor that can make you trip. What can I do in the bedroom? Use night lights. Make sure that you have a light by your bed that is easy to reach. Do not use any sheets or blankets that are too big for your bed. They should not hang down onto the floor. Have a firm chair that has side arms. You can use this for support while you get dressed. Do not have throw rugs and other things on the floor that can make you trip. What can I do in the kitchen? Clean up any spills right  away. Avoid walking on wet floors. Keep items that you use a lot in easy-to-reach places. If you need to reach something above you, use a strong step stool that has a grab bar. Keep electrical cords out of the way. Do not use floor polish or wax that makes floors slippery. If you must use wax, use non-skid floor wax. Do not have throw rugs and other things on the floor that can make you trip. What can I do with my stairs? Do not leave any items on the stairs. Make sure that there are handrails on both sides of the stairs and use them. Fix handrails that are broken or loose. Make sure that handrails are as long as the stairways. Check any carpeting to make sure that it is firmly attached to the stairs. Fix any carpet that is loose or worn. Avoid having throw rugs at the top or bottom of the stairs. If you do have throw rugs, attach them to the floor with carpet tape. Make sure that you  have a light switch at the top of the stairs and the bottom of the stairs. If you do not have them, ask someone to add them for you. What else can I do to help prevent falls? Wear shoes that: Do not have high heels. Have rubber bottoms. Are comfortable and fit you well. Are closed at the toe. Do not wear sandals. If you use a stepladder: Make sure that it is fully opened. Do not climb a closed stepladder. Make sure that both sides of the stepladder are locked into place. Ask someone to hold it for you, if possible. Clearly mark and make sure that you can see: Any grab bars or handrails. First and last steps. Where the edge of each step is. Use tools that help you move around (mobility aids) if they are needed. These include: Canes. Walkers. Scooters. Crutches. Turn on the lights when you go into a dark area. Replace any light bulbs as soon as they burn out. Set up your furniture so you have a clear path. Avoid moving your furniture around. If any of your floors are uneven, fix them. If there are any pets around you, be aware of where they are. Review your medicines with your doctor. Some medicines can make you feel dizzy. This can increase your chance of falling. Ask your doctor what other things that you can do to help prevent falls. This information is not intended to replace advice given to you by your health care provider. Make sure you discuss any questions you have with your health care provider. Document Released: 11/17/2008 Document Revised: 06/29/2015 Document Reviewed: 02/25/2014 Elsevier Interactive Patient Education  2017 [employer].

## 2022-05-08 NOTE — Progress Notes (Signed)
I connected with  Joy Cobb on 05/08/22 by a audio enabled telemedicine application and verified that I am speaking with the correct person using two identifiers.  Patient Location: Home  Provider Location: Office/Clinic  I discussed the limitations of evaluation and management by telemedicine. The patient expressed understanding and agreed to proceed.  Subjective:   Joy Cobb is a 42 y.o. female who presents for Medicare Annual (Subsequent) preventive examination.  Review of Systems     Cardiac Risk Factors include: obesity (BMI >30kg/m2)     Objective:    Today's Vitals   05/08/22 1427  Weight: 260 lb (117.9 kg)  Height: 5\' 6"  (1.676 m)   Body mass index is 41.97 kg/m.     05/08/2022    2:32 PM 02/15/2012    4:25 AM  Advanced Directives  Does Patient Have a Medical Advance Directive? No Patient would like information  Would patient like information on creating a medical advance directive?  Referral made to social work;Advance directive packet given    Current Medications (verified) Outpatient Encounter Medications as of 05/08/2022  Medication Sig   albuterol (VENTOLIN HFA) 108 (90 Base) MCG/ACT inhaler Inhale 2 puffs into the lungs every 6 (six) hours as needed for wheezing or shortness of breath.   baclofen (LIORESAL) 10 MG tablet Take 1 tablet (10 mg total) by mouth 3 (three) times daily.   cefdinir (OMNICEF) 300 MG capsule Take 1 capsule (300 mg total) by mouth 2 (two) times daily.   cetirizine (ZYRTEC) 10 MG tablet Take 10 mg by mouth daily.   [facility] TUSSIN DM MAX ST 20-400 MG/20ML LIQD Take by mouth.   drospirenone-ethinyl estradiol (YAZ) 3-0.02 MG tablet Take 1 tablet by mouth daily.   fentaNYL (DURAGESIC) 25 MCG/HR Place 1 patch onto the skin every 3 (three) days.   FLUoxetine (PROZAC) 40 MG capsule One capsule po qd   hydrOXYzine (ATARAX) 25 MG tablet Take 1 tablet (25 mg total) by mouth at bedtime.   linaclotide (LINZESS) 290 MCG CAPS capsule Take 1  capsule (290 mcg total) by mouth daily before breakfast.   meloxicam (MOBIC) 15 MG tablet Take 1 tablet (15 mg total) by mouth daily as needed (For hip pain flare ups).   metoCLOPramide (REGLAN) 5 MG tablet Take 2 tabs po bid   nystatin cream (MYCOSTATIN) Apply topically 2 (two) times daily.   pantoprazole (PROTONIX) 40 MG tablet Take 1 tablet (40 mg total) by mouth daily.   pregabalin (LYRICA) 150 MG capsule TAKE ONE CAPSULE BY MOUTH AT BEDTIME   sucralfate (CARAFATE) 1 GM/10ML suspension Take 10 mLs (1 g total) by mouth 2 (two) times daily. prn   TECFIDERA 240 MG CPDR TAKE 1 CAPSULE (240MG ) BY MOUTH  TWICE DAILY   traZODone (DESYREL) 100 MG tablet Take 1 tablet (100 mg total) by mouth at bedtime.   AFLURIA QUADRIVALENT 0.5 ML injection    Vitamin D, Ergocalciferol, (DRISDOL) 50000 units CAPS capsule Take 50,000 Units by mouth 2 (two) times a week. (Patient not taking: Reported on 05/08/2022)   No facility-administered encounter medications on file as of 05/08/2022.    Allergies (verified) Penicillins, Temazepam, Amoxicillin, and Septra [sulfamethoxazole-trimethoprim]   History: Past Medical History:  Diagnosis Date   Bladder spasms    Colonic inertia    GERD (gastroesophageal reflux disease)    IBS (irritable bowel syndrome)    Insomnia    Multiple sclerosis    Neuropathy    Vision abnormalities    Past Surgical  History:  Procedure Laterality Date   BREAST SURGERY Right 2005   CYSTECTOMY     polyp removal     UPPER GASTROINTESTINAL ENDOSCOPY     Family History  Problem Relation Age of Onset   Neuropathy Mother    Allergic rhinitis Mother    Urticaria Mother    GER disease Mother    Cancer - Other Father    Thyroid disease Maternal Aunt    Breast cancer Maternal Aunt    Parkinson's disease Maternal Aunt    Diabetes Maternal Grandmother    Hypertension Maternal Grandmother    Kidney failure Maternal Grandmother    Parkinson's disease Maternal Grandmother    Kidney  disease Maternal Grandfather    Hyperlipidemia Paternal Grandmother    Hyperlipidemia Paternal Grandfather    Colon cancer Other    Asthma Neg Hx    Eczema Neg Hx    Esophageal cancer Neg Hx    Rectal cancer Neg Hx    Stomach cancer Neg Hx    Social History   Socioeconomic History   Marital status: Single    Spouse name: Not on file   Number of children: Not on file   Years of education: Not on file   Highest education level: Not on file  Occupational History   Not on file  Tobacco Use   Smoking status: Some Days    Types: E-cigarettes    Start date: 02/05/2020    Passive exposure: Never   Smokeless tobacco: Never   Tobacco comments:    Vapes some but not on a regular basis. 06/18/21. Hsm.   Vaping Use   Vaping Use: Never used  Substance and Sexual Activity   Alcohol use: Yes    Comment: socially   Drug use: No   Sexual activity: Not Currently  Other Topics Concern   Not on file  Social History Narrative   Not on file   Social Determinants of Health   Financial Resource Strain: Low Risk  (05/08/2022)   Overall Financial Resource Strain (CARDIA)    Difficulty of Paying Living Expenses: Not hard at all  Food Insecurity: No Food Insecurity (05/08/2022)   Hunger Vital Sign    Worried About Running Out of Food in the Last Year: Never true    Ran Out of Food in the Last Year: Never true  Transportation Needs: No Transportation Needs (05/08/2022)   PRAPARE - Hydrologist (Medical): No    Lack of Transportation (Non-Medical): No  Physical Activity: Inactive (05/08/2022)   Exercise Vital Sign    Days of Exercise per Week: 0 days    Minutes of Exercise per Session: 0 min  Stress: No Stress Concern Present (05/08/2022)   [place]    Feeling of Stress : Only a little  Social Connections: Not on file    Tobacco Counseling Ready to quit: Not Answered Counseling given: Not  Answered Tobacco comments: Vapes some but not on a regular basis. 06/18/21. Hsm.    Clinical Intake:  Pre-visit preparation completed: Yes  Pain : No/denies pain     Nutritional Status: BMI > 30  Obese Nutritional Risks: None Diabetes: No  How often do you need to have someone help you when you read instructions, pamphlets, or other written materials from your doctor or pharmacy?: 1 - Never  Diabetic? no  Interpreter Needed?: No  Information entered by :: NAllen LPN   Activities of Daily  Living    05/08/2022    2:33 PM  In your present state of health, do you have any difficulty performing the following activities:  Hearing? 0  Vision? 0  Difficulty concentrating or making decisions? 1  Walking or climbing stairs? 0  Dressing or bathing? 0  Doing errands, shopping? 0  Preparing Food and eating ? N  Using the Toilet? N  In the past six months, have you accidently leaked urine? Y  Do you have problems with loss of bowel control? N  Managing your Medications? N  Managing your Finances? N  Housekeeping or managing your Housekeeping? N    Patient Care Team: Glendale Chard, MD as PCP - General (Internal Medicine)  Indicate any recent Medical Services you may have received from other than [facility] providers in the past year (date may be approximate).     Assessment:   This is a routine wellness examination for Ronny.  Hearing/Vision screen Vision Screening - Comments:: Regular eye exams, [facility]  Dietary issues and exercise activities discussed: Current Exercise Habits: The patient does not participate in regular exercise at present   Goals Addressed             This Visit's Progress    Patient Stated       05/08/2022, working on gastroparesis holistically       Depression Screen    05/08/2022    2:33 PM 03/13/2022    2:42 PM  PHQ 2/9 Scores  PHQ - 2 Score 0 0    Fall Risk    05/08/2022    2:33 PM 03/13/2022    2:42 PM  [place] in the  past year? 1 1  Comment tripped   Number falls in past yr: 0 0  Injury with Fall? 0 0  Risk for fall due to : Medication side effect No Fall Risks  Follow up Falls prevention discussed;Education provided;Falls evaluation completed Falls evaluation completed    FALL RISK PREVENTION PERTAINING TO THE HOME:  Any stairs in or around the home? Yes  If so, are there any without handrails? No  Home free of loose throw rugs in walkways, pet beds, electrical cords, etc? Yes  Adequate lighting in your home to reduce risk of falls? Yes   ASSISTIVE DEVICES UTILIZED TO PREVENT FALLS:  Life alert? No  Use of a cane, walker or w/c? No  Grab bars in the bathroom? No  Shower chair or bench in shower? No  Elevated toilet seat or a handicapped toilet? No   TIMED UP AND GO:  Was the test performed? No .      Cognitive Function:        05/08/2022    2:35 PM  6CIT Screen  What Year? 0 points  What month? 0 points  What time? 0 points  Count back from 20 0 points  Months in reverse 0 points  Repeat phrase 2 points  Total Score 2 points    Immunizations Immunization History  Administered Date(s) Administered   Influenza Inj Mdck Quad Pf 10/27/2018   Influenza,inj,Quad PF,6+ Mos 10/28/2016, 10/17/2017, 11/12/2021   PFIZER SARS-COV-2 Pediatric Vaccination 5-81yrs 05/31/2020   Pfizer Covid-19 Vaccine Bivalent Booster 18yrs & up 08/22/2019, 09/12/2019, 02/02/2021    TDAP status: Due, Education has been provided regarding the importance of this vaccine. Advised may receive this vaccine at local pharmacy or Health Dept. Aware to provide a copy of the vaccination record if obtained from local pharmacy  or Health Dept. Verbalized acceptance and understanding.  Flu Vaccine status: Up to date  Pneumococcal vaccine status: Up to date  Covid-19 vaccine status: Completed vaccines  Qualifies for Shingles Vaccine? No   Zostavax completed  n/a   Shingrix Completed?: Yes  Screening  Tests Health Maintenance  Topic Date Due   HIV Screening  Never done   DTaP/Tdap/Td (1 - Tdap) Never done   PAP SMEAR-Modifier  Never done   COVID-19 Vaccine (4 - 2023-24 season) 10/05/2021   INFLUENZA VACCINE  09/05/2022   Medicare Annual Wellness (AWV)  10/25/2022   Hepatitis C Screening  Completed   HPV VACCINES  Aged Out    Health Maintenance  Health Maintenance Due  Topic Date Due   HIV Screening  Never done   DTaP/Tdap/Td (1 - Tdap) Never done   PAP SMEAR-Modifier  Never done   COVID-19 Vaccine (4 - 2023-24 season) 10/05/2021    Colorectal cancer screening: No longer required.   Mammogram status: Completed 12/17/2021. Repeat every year  Bone Density status: n/a  Lung Cancer Screening: (Low Dose CT Chest recommended if Age 84-80 years, 30 pack-year currently smoking OR have quit w/in 15years.) does not qualify.   Lung Cancer Screening Referral: no  Additional Screening:  Hepatitis C Screening: does qualify; Completed 05/29/2017  Vision Screening: Recommended annual ophthalmology exams for early detection of glaucoma and other disorders of the eye. Is the patient up to date with their annual eye exam?  Yes  Who is the provider or what is the name of the office in which the patient attends annual eye exams? [facility] If pt is not established with a provider, would they like to be referred to a provider to establish care? No .   Dental Screening: Recommended annual dental exams for proper oral hygiene  Community Resource Referral / Chronic Care Management: CRR required this visit?  No   CCM required this visit?  No      Plan:     I have personally reviewed and noted the following in the patient's chart:   Medical and social history Use of alcohol, tobacco or illicit drugs  Current medications and supplements including opioid prescriptions. Patient is currently taking opioid prescriptions. Information provided to patient regarding non-opioid alternatives.  Patient advised to discuss non-opioid treatment plan with their provider. Functional ability and status Nutritional status Physical activity Advanced directives List of other physicians Hospitalizations, surgeries, and ER visits in previous 12 months Vitals Screenings to include cognitive, depression, and falls Referrals and appointments  In addition, I have reviewed and discussed with patient certain preventive protocols, quality metrics, and best practice recommendations. A written personalized care plan for preventive services as well as general preventive health recommendations were provided to patient.     Kellie Simmering, LPN   [id number]   Nurse Notes: none  Due to this being a virtual visit, the after visit summary with patients personalized plan was offered to patient via mail or my-chart. Patient would like to access on my-chart

## 2022-05-14 ENCOUNTER — Other Ambulatory Visit: Payer: Self-pay

## 2022-05-14 MED ORDER — SUCRALFATE 1 GM/10ML PO SUSP: 1.0000 g | Freq: Two times a day (BID) | ORAL | 0 refills | Status: DC

## 2022-05-22 DIAGNOSIS — H52223 Regular astigmatism, bilateral: Secondary | ICD-10-CM | POA: Diagnosis not present

## 2022-05-23 ENCOUNTER — Ambulatory Visit: Payer: Medicare Other | Admitting: Family Medicine

## 2022-06-05 ENCOUNTER — Other Ambulatory Visit: Payer: Self-pay | Admitting: Neurology

## 2022-06-05 MED ORDER — FENTANYL 25 MCG/HR TD PT72: 1.0000 | MEDICATED_PATCH | TRANSDERMAL | 0 refills | Status: DC

## 2022-06-05 NOTE — Telephone Encounter (Signed)
Pt is requesting a refill for fentaNYL (DURAGESIC) 25 MCG/HR.  Pharmacy: [id number]  

## 2022-06-05 NOTE — Telephone Encounter (Signed)
Last seen 04/11/22 and next f/u 10/16/22. Last refilled 05/06/22 #10.

## 2022-06-09 ENCOUNTER — Other Ambulatory Visit: Payer: Self-pay | Admitting: Internal Medicine

## 2022-06-13 ENCOUNTER — Other Ambulatory Visit: Payer: Self-pay | Admitting: Internal Medicine

## 2022-06-20 ENCOUNTER — Other Ambulatory Visit: Payer: Self-pay | Admitting: Internal Medicine

## 2022-07-03 ENCOUNTER — Other Ambulatory Visit: Payer: Self-pay | Admitting: Neurology

## 2022-07-03 ENCOUNTER — Other Ambulatory Visit: Payer: Self-pay

## 2022-07-03 MED ORDER — FENTANYL 25 MCG/HR TD PT72: 1.0000 | MEDICATED_PATCH | TRANSDERMAL | 0 refills | Status: DC

## 2022-07-03 NOTE — Telephone Encounter (Signed)
Pt is needing a refill on her  fentaNYL (DURAGESIC) 25 MCG/HR on Saturday because she is putting on the last patch now. [facility] [address]

## 2022-07-03 NOTE — Telephone Encounter (Signed)
Called pt and she stated that she has 1 patch left. Told pt we will let the Doctor know and see what they advise.    Pt Last seen 04/21/2022 Upcoming Appointment 10/16/2022  Fentanyl last filled 06/05/2022

## 2022-07-03 NOTE — Telephone Encounter (Signed)
Called pt and let her know that her refill has been sent to her pharmacy.

## 2022-07-24 ENCOUNTER — Other Ambulatory Visit: Payer: Self-pay | Admitting: Internal Medicine

## 2022-07-25 ENCOUNTER — Telehealth: Payer: Self-pay

## 2022-07-27 ENCOUNTER — Ambulatory Visit
Admission: EM | Admit: 2022-07-27 | Discharge: 2022-07-27 | Disposition: A | Discharge status: Home / Self Care | Payer: 59 | Attending: Urgent Care | Admitting: Urgent Care

## 2022-07-27 DIAGNOSIS — J4521 Mild intermittent asthma with (acute) exacerbation: Secondary | ICD-10-CM | POA: Diagnosis not present

## 2022-07-27 DIAGNOSIS — J209 Acute bronchitis, unspecified: Secondary | ICD-10-CM

## 2022-07-27 DIAGNOSIS — J441 Chronic obstructive pulmonary disease with (acute) exacerbation: Secondary | ICD-10-CM

## 2022-07-27 MED ORDER — PROMETHAZINE-DM 6.25-15 MG/5ML PO SYRP: 5.0000 mL | ORAL_SOLUTION | Freq: Three times a day (TID) | ORAL | 0 refills | Status: DC | PRN

## 2022-07-27 MED ORDER — IPRATROPIUM-ALBUTEROL 0.5-2.5 (3) MG/3ML IN SOLN
3.0000 mL | Freq: Once | RESPIRATORY_TRACT | Status: AC
  Administered 2022-07-27: 3 mL via RESPIRATORY_TRACT

## 2022-07-27 MED ORDER — PSEUDOEPHEDRINE HCL 30 MG PO TABS: 30.0000 mg | ORAL_TABLET | Freq: Three times a day (TID) | ORAL | 0 refills | Status: DC | PRN

## 2022-07-27 MED ORDER — ALBUTEROL SULFATE HFA 108 (90 BASE) MCG/ACT IN AERS: 2.0000 | INHALATION_SPRAY | Freq: Four times a day (QID) | RESPIRATORY_TRACT | 0 refills | Status: AC | PRN

## 2022-07-27 MED ORDER — CETIRIZINE HCL 10 MG PO TABS: 10.0000 mg | ORAL_TABLET | Freq: Every day | ORAL | 0 refills | Status: AC

## 2022-07-27 NOTE — ED Triage Notes (Signed)
Pt reports cough and wheezing x 5 days after returned from vacation. Wheezing is worse at night. Reports she had fever 101 F 3 days ago.   Requested albuterol refill.

## 2022-07-27 NOTE — Discharge Instructions (Signed)
We will manage this as a viral bronchitis. For sore throat or cough try using a honey-based tea. Use 3 teaspoons of honey with juice squeezed from half lemon. Place shaved pieces of ginger into 1/2-1 cup of water and warm over stove top. Then mix the ingredients and repeat every 4 hours as needed. Please take Tylenol 650mg  once every 6 hours for fevers, aches and pains. Hydrate very well with at least 2 liters (80 ounces) of water. Eat light meals such as soups (chicken and noodles, chicken wild rice, vegetable).  Do not eat any foods that you are allergic to.  Start an antihistamine like Zyrtec (10mg  daily) for postnasal drainage, sinus congestion.  You can take this together with prednisone and albuterol. Use cough medications as needed.

## 2022-07-27 NOTE — ED Provider Notes (Signed)
[facility]  Note:  This document was prepared using Conservation officer, historic buildings and may include unintentional dictation errors.  MRN: [id number] DOB: 05/14/1980  Subjective:   Joy Cobb is a 42 y.o. female presenting for 5-day history of acute onset persistent wheezing, coughing, coughing fits, chest tightness.  Wheezing is worse at night.  Needs a refill of her albuterol inhaler.  Patient cannot tolerate any kind of steroids.  Reports that it flares up her multiple sclerosis.  Has had bouts of bronchitis.  No smoking of any kind including cigarettes, cigars, vaping, marijuana use.    No current facility-administered medications for this encounter.  Current Outpatient Medications:    AFLURIA QUADRIVALENT 0.5 ML injection, , Disp: , Rfl:    albuterol (VENTOLIN HFA) 108 (90 Base) MCG/ACT inhaler, Inhale 2 puffs into the lungs every 6 (six) hours as needed for wheezing or shortness of breath., Disp: 2 each, Rfl: 2   baclofen (LIORESAL) 10 MG tablet, Take 1 tablet (10 mg total) by mouth 3 (three) times daily., Disp: 270 tablet, Rfl: 3   cefdinir (OMNICEF) 300 MG capsule, Take 1 capsule (300 mg total) by mouth 2 (two) times daily., Disp: 20 capsule, Rfl: 0   cetirizine (ZYRTEC) 10 MG tablet, Take 10 mg by mouth daily., Disp: , Rfl:    [facility] TUSSIN DM MAX ST 20-400 MG/20ML LIQD, Take by mouth., Disp: , Rfl:    drospirenone-ethinyl estradiol (YAZ) 3-0.02 MG tablet, Take 1 tablet by mouth daily., Disp: 3 tablet, Rfl: 2   fentaNYL (DURAGESIC) 25 MCG/HR, Place 1 patch onto the skin every 3 (three) days., Disp: 10 patch, Rfl: 0   FLUoxetine (PROZAC) 40 MG capsule, One capsule po qd, Disp: 90 capsule, Rfl: 1   hydrOXYzine (ATARAX) 25 MG tablet, TAKE 1 TABLET BY MOUTH EVERYDAY AT BEDTIME, Disp: 90 tablet, Rfl: 2   linaclotide (LINZESS) 290 MCG CAPS capsule, Take 1 capsule (290 mcg total) by mouth daily before breakfast., Disp: 30 capsule, Rfl: 3   meloxicam (MOBIC) 15  MG tablet, TAKE 1 TABLET (15 MG TOTAL) BY MOUTH DAILY AS NEEDED (FOR HIP PAIN FLARE UPS)., Disp: 30 tablet, Rfl: 2   metoCLOPramide (REGLAN) 5 MG tablet, Take 2 tabs po bid, Disp: 120 tablet, Rfl: 3   nystatin cream (MYCOSTATIN), Apply topically 2 (two) times daily., Disp: , Rfl:    pantoprazole (PROTONIX) 40 MG tablet, Take 1 tablet (40 mg total) by mouth daily., Disp: 90 tablet, Rfl: 1   pregabalin (LYRICA) 150 MG capsule, TAKE ONE CAPSULE BY MOUTH AT BEDTIME, Disp: 90 capsule, Rfl: 1   sucralfate (CARAFATE) 1 GM/10ML suspension, TAKE 10 MLS (1 G TOTAL) BY MOUTH 2 (TWO) TIMES DAILY AS NEEDED, Disp: 600 mL, Rfl: 0   TECFIDERA 240 MG CPDR, TAKE 1 CAPSULE (240MG ) BY MOUTH  TWICE DAILY, Disp: 180 capsule, Rfl: 5   traZODone (DESYREL) 100 MG tablet, Take 1 tablet (100 mg total) by mouth at bedtime., Disp: 90 tablet, Rfl: 1   Vitamin D, Ergocalciferol, (DRISDOL) 50000 units CAPS capsule, Take 50,000 Units by mouth 2 (two) times a week. (Patient not taking: Reported on 05/08/2022), Disp: , Rfl:    Allergies  Allergen Reactions   Penicillins Hives   Temazepam Nausea Only    Sharp pains in stomach   Amoxicillin Rash   Septra [Sulfamethoxazole-Trimethoprim] Rash    Past Medical History:  Diagnosis Date   Bladder spasms    Colonic inertia    GERD (gastroesophageal reflux  disease)    IBS (irritable bowel syndrome)    Insomnia    Multiple sclerosis (HCC)    Neuropathy    Vision abnormalities      Past Surgical History:  Procedure Laterality Date   BREAST SURGERY Right 2005   CYSTECTOMY     polyp removal     UPPER GASTROINTESTINAL ENDOSCOPY      Family History  Problem Relation Age of Onset   Neuropathy Mother    Allergic rhinitis Mother    Urticaria Mother    GER disease Mother    Cancer - Other Father    Thyroid disease Maternal Aunt    Breast cancer Maternal Aunt    Parkinson's disease Maternal Aunt    Diabetes Maternal Grandmother    Hypertension Maternal Grandmother     Kidney failure Maternal Grandmother    Parkinson's disease Maternal Grandmother    Kidney disease Maternal Grandfather    Hyperlipidemia Paternal Grandmother    Hyperlipidemia Paternal Grandfather    Colon cancer Other    Asthma Neg Hx    Eczema Neg Hx    Esophageal cancer Neg Hx    Rectal cancer Neg Hx    Stomach cancer Neg Hx     Social History   Tobacco Use   Smoking status: Former    Types: E-cigarettes    Start date: 02/05/2020    Passive exposure: Never   Smokeless tobacco: Never   Tobacco comments:    Vapes some but not on a regular basis. 06/18/21. Hsm.   Vaping Use   Vaping Use: Former  Substance Use Topics   Alcohol use: Yes    Comment: socially   Drug use: No    ROS   Objective:   Vitals: BP (!) 144/90 (BP Location: Right Arm)   Pulse 79   Temp 98.2 F (36.8 C) (Oral)   Resp 18   SpO2 95%   Physical Exam Constitutional:      General: She is not in acute distress.    Appearance: Normal appearance. She is well-developed. She is not ill-appearing, toxic-appearing or diaphoretic.  HENT:     Head: Normocephalic and atraumatic.     Nose: Nose normal.     Mouth/Throat:     Mouth: Mucous membranes are moist.  Eyes:     General: No scleral icterus.       Right eye: No discharge.        Left eye: No discharge.     Extraocular Movements: Extraocular movements intact.  Cardiovascular:     Rate and Rhythm: Normal rate and regular rhythm.     Heart sounds: Normal heart sounds. No murmur heard.    No friction rub. No gallop.  Pulmonary:     Effort: Pulmonary effort is normal. No respiratory distress.     Breath sounds: No stridor. Wheezing and rhonchi present. No rales.     Comments: Diffuse wheezing and rhonchi throughout. Chest:     Chest wall: No tenderness.  Skin:    General: Skin is warm and dry.  Neurological:     General: No focal deficit present.     Mental Status: She is alert and oriented to person, place, and time.  Psychiatric:         Mood and Affect: Mood normal.        Behavior: Behavior normal.    A 0.5 mg - 2.5 mg duo nebulizer treatment of ipratropium albuterol was administered.  Assessment and Plan :   PDMP  not reviewed this encounter.  1. Acute bronchitis, unspecified organism   2. Mild intermittent reactive airway disease with acute exacerbation    Deferred antibiotics, discussed antibiotic stewardship, patient was agreeable. Patient refused any steroids.  Recommended supportive care, refilled her albuterol inhaler. Deferred imaging as lung sounds were clear following the nebulizer treatment.  Counseled patient on potential for adverse effects with medications prescribed/recommended today, ER and return-to-clinic precautions discussed, patient verbalized understanding.    Wallis Bamberg, [place] 07/27/22 1337

## 2022-08-05 ENCOUNTER — Other Ambulatory Visit: Payer: Self-pay | Admitting: Neurology

## 2022-08-05 MED ORDER — FENTANYL 25 MCG/HR TD PT72: 1.0000 | MEDICATED_PATCH | TRANSDERMAL | 0 refills | Status: DC

## 2022-08-05 NOTE — Telephone Encounter (Signed)
Pt request refill for fentaNYL (DURAGESIC) 25 MCG/HR  [phone]

## 2022-08-05 NOTE — Telephone Encounter (Signed)
Pt last seen on 04/11/22 per note "Will renew fentanyl and check UDS we discussed that since she also uses THC at night that it is important that she not increase her opiate dose further. " Follow up scheduled on 10/16/22 Last filled on 07/03/22 #10 (30 day supply) Rx pending to be signed

## 2022-08-06 ENCOUNTER — Telehealth: Payer: Self-pay | Admitting: Neurology

## 2022-08-06 MED ORDER — FENTANYL 25 MCG/HR TD PT72: 1.0000 | MEDICATED_PATCH | TRANSDERMAL | 0 refills | Status: DC

## 2022-08-06 NOTE — Telephone Encounter (Signed)
[facility]elled [facility] [address] [facility].  [place] drug registry checked last fill 07-03-2022 #10.

## 2022-08-06 NOTE — Telephone Encounter (Signed)
[facility] fentaNYL (DURAGESIC) 25 MCG/HR. Did find in stock at another [facility]. [facility] [phone]

## 2022-08-06 NOTE — Telephone Encounter (Signed)
Dr.Sater you filled this Rx yesterday for patient however the [facility] was out of stock of Rx. Pt said asking if Rx could be sent to a different [facility].   Rx pending to be signed

## 2022-08-21 ENCOUNTER — Other Ambulatory Visit: Payer: Self-pay | Admitting: Internal Medicine

## 2022-08-30 NOTE — Telephone Encounter (Signed)
Error

## 2022-09-05 ENCOUNTER — Other Ambulatory Visit: Payer: Self-pay | Admitting: Neurology

## 2022-09-05 MED ORDER — FENTANYL 25 MCG/HR TD PT72: 1.0000 | MEDICATED_PATCH | TRANSDERMAL | 0 refills | Status: DC

## 2022-09-05 NOTE — Telephone Encounter (Signed)
Pt last seen on 04/11/22 Follow up scheduled on 10/16/22 Last filled on 08/06/22 #10 patches (30 day supply) Rx pending to be signed

## 2022-09-05 NOTE — Telephone Encounter (Signed)
Pt is requesting a refill for fentaNYL (DURAGESIC) 25 MCG/HR.  [phone]

## 2022-09-11 ENCOUNTER — Other Ambulatory Visit: Payer: Self-pay | Admitting: Internal Medicine

## 2022-09-19 ENCOUNTER — Other Ambulatory Visit: Payer: Self-pay | Admitting: Internal Medicine

## 2022-09-19 ENCOUNTER — Encounter: Payer: Self-pay | Admitting: Internal Medicine

## 2022-09-19 ENCOUNTER — Ambulatory Visit (INDEPENDENT_AMBULATORY_CARE_PROVIDER_SITE_OTHER): Payer: 59 | Admitting: Internal Medicine

## 2022-09-19 VITALS — BP 118/82 | HR 70 | Temp 98.0°F | Ht 66.0 in | Wt 269.6 lb

## 2022-09-19 DIAGNOSIS — G35 Multiple sclerosis: Secondary | ICD-10-CM

## 2022-09-19 DIAGNOSIS — Z0000 Encounter for general adult medical examination without abnormal findings: Secondary | ICD-10-CM | POA: Diagnosis not present

## 2022-09-19 DIAGNOSIS — K219 Gastro-esophageal reflux disease without esophagitis: Secondary | ICD-10-CM

## 2022-09-19 DIAGNOSIS — F321 Major depressive disorder, single episode, moderate: Secondary | ICD-10-CM

## 2022-09-19 DIAGNOSIS — Z6841 Body Mass Index (BMI) 40.0 and over, adult: Secondary | ICD-10-CM

## 2022-09-19 DIAGNOSIS — R202 Paresthesia of skin: Secondary | ICD-10-CM

## 2022-09-19 DIAGNOSIS — K3184 Gastroparesis: Secondary | ICD-10-CM

## 2022-09-19 MED ORDER — FLUOXETINE HCL 40 MG PO CAPS: ORAL_CAPSULE | ORAL | 1 refills | Status: DC

## 2022-09-19 NOTE — Patient Instructions (Addendum)
[facility] Maintenance, Female Adopting a healthy lifestyle and getting preventive care are important in promoting health and wellness. Ask your health care provider about: The right schedule for you to have regular tests and exams. Things you can do on your own to prevent diseases and keep yourself healthy. What should I know about diet, weight, and exercise? Eat a healthy diet  Eat a diet that includes plenty of vegetables, fruits, low-fat dairy products, and lean protein. Do not eat a lot of foods that are high in solid fats, added sugars, or sodium. Maintain a healthy weight Body mass index (BMI) is used to identify weight problems. It estimates body fat based on height and weight. Your health care provider can help determine your BMI and help you achieve or maintain a healthy weight. Get regular exercise Get regular exercise. This is one of the most important things you can do for your health. Most adults should: Exercise for at least 150 minutes each week. The exercise should increase your heart rate and make you sweat (moderate-intensity exercise). Do strengthening exercises at least twice a week. This is in addition to the moderate-intensity exercise. Spend less time sitting. Even light physical activity can be beneficial. Watch cholesterol and blood lipids Have your blood tested for lipids and cholesterol at 43 years of age, then have this test every 5 years. Have your cholesterol levels checked more often if: Your lipid or cholesterol levels are high. You are older than 43 years of age. You are at high risk for heart disease. What should I know about cancer screening? Depending on your health history and family history, you may need to have cancer screening at various ages. This may include screening for: Breast cancer. Cervical cancer. Colorectal cancer. Skin cancer. Lung cancer. What should I know about heart disease,  diabetes, and high blood pressure? Blood pressure and heart disease High blood pressure causes heart disease and increases the risk of stroke. This is more likely to develop in people who have high blood pressure readings or are overweight. Have your blood pressure checked: Every 3-5 years if you are 44-67 years of age. Every year if you are 59 years old or older. Diabetes Have regular diabetes screenings. This checks your fasting blood sugar level. Have the screening done: Once every three years after age 57 if you are at a normal weight and have a low risk for diabetes. More often and at a younger age if you are overweight or have a high risk for diabetes. What should I know about preventing infection? Hepatitis B If you have a higher risk for hepatitis B, you should be screened for this virus. Talk with your health care provider to find out if you are at risk for hepatitis B infection. Hepatitis C Testing is recommended for: Everyone born from [birth year] through 1966. Anyone with known risk factors for hepatitis C. Sexually transmitted infections (STIs) Get screened for STIs, including gonorrhea and chlamydia, if: You are sexually active and are younger than 43 years of age. You are older than 43 years of age and your health care provider tells you that you are at risk for this type of infection. Your sexual activity has changed since you were last screened, and you are at increased risk for chlamydia or gonorrhea. Ask your health care provider if you are at risk. Ask your health care provider about whether you are at high risk for HIV. Your health care provider  may recommend a prescription medicine to help prevent HIV infection. If you choose to take medicine to prevent HIV, you should first get tested for HIV. You should then be tested every 3 months for as long as you are taking the medicine. Pregnancy If you are about to stop having your period (premenopausal) and you may become pregnant,  seek counseling before you get pregnant. Take 400 to 800 micrograms (mcg) of folic acid every day if you become pregnant. Ask for birth control (contraception) if you want to prevent pregnancy. Osteoporosis and menopause Osteoporosis is a disease in which the bones lose minerals and strength with aging. This can result in bone fractures. If you are 76 years old or older, or if you are at risk for osteoporosis and fractures, ask your health care provider if you should: Be screened for bone loss. Take a calcium or vitamin D supplement to lower your risk of fractures. Be given hormone replacement therapy (HRT) to treat symptoms of menopause. Follow these instructions at home: Alcohol use Do not drink alcohol if: Your health care provider tells you not to drink. You are pregnant, may be pregnant, or are planning to become pregnant. If you drink alcohol: Limit how much you have to: 0-1 drink a day. Know how much alcohol is in your drink. In the [place] one drink equals one 12 oz bottle of beer (355 mL), one 5 oz glass of wine (148 mL), or one 1 oz glass of hard liquor (44 mL). Lifestyle Do not use any products that contain nicotine or tobacco. These products include cigarettes, chewing tobacco, and vaping devices, such as e-cigarettes. If you need help quitting, ask your health care provider. Do not use street drugs. Do not share needles. Ask your health care provider for help if you need support or information about quitting drugs. General instructions Schedule regular health, dental, and eye exams. Stay current with your vaccines. Tell your health care provider if: You often feel depressed. You have ever been abused or do not feel safe at home. Summary Adopting a healthy lifestyle and getting preventive care are important in promoting health and wellness. Follow your health care provider's instructions about healthy diet, exercising, and getting tested or screened for diseases. Follow your  health care provider's instructions on monitoring your cholesterol and blood pressure. This information is not intended to replace advice given to you by your health care provider. Make sure you discuss any questions you have with your health care provider. Document Revised: 06/12/2020 Document Reviewed: 06/12/2020 Elsevier Patient Education  2024 [employer].

## 2022-09-19 NOTE — Progress Notes (Signed)
I,Victoria T Deloria Lair, CMA,acting as a Neurosurgeon for Gwynneth Aliment, MD.,have documented all relevant documentation on the behalf of Gwynneth Aliment, MD,as directed by  Gwynneth Aliment, MD while in the presence of Gwynneth Aliment, MD.  Subjective:    Patient ID: Joy Cobb , female    DOB: 11/14/1980 , 42 y.o.   MRN: [id number]  Chief Complaint  Patient presents with   Annual Exam    HPI  Patient presents today for annual exam. She reports compliance with medications. She is not currently followed by GYN.  She reports wanting to discuss her weight. She feels her weight just keeps going up. She wants to know what she should do that's best.   She declines wanting to complete PAP today.      Past Medical History:  Diagnosis Date   Bladder spasms    Colonic inertia    GERD (gastroesophageal reflux disease)    IBS (irritable bowel syndrome)    Insomnia    Multiple sclerosis (HCC)    Neuropathy    Vision abnormalities      Family History  Problem Relation Age of Onset   Neuropathy Mother    Allergic rhinitis Mother    Urticaria Mother    GER disease Mother    Cancer - Other Father    Thyroid disease Maternal Aunt    Breast cancer Maternal Aunt    Parkinson's disease Maternal Aunt    Diabetes Maternal Grandmother    Hypertension Maternal Grandmother    Kidney failure Maternal Grandmother    Parkinson's disease Maternal Grandmother    Kidney disease Maternal Grandfather    Hyperlipidemia Paternal Grandmother    Hyperlipidemia Paternal Grandfather    Colon cancer Other    Asthma Neg Hx    Eczema Neg Hx    Esophageal cancer Neg Hx    Rectal cancer Neg Hx    Stomach cancer Neg Hx      Current Outpatient Medications:    AFLURIA QUADRIVALENT 0.5 ML injection, , Disp: , Rfl:    albuterol (VENTOLIN HFA) 108 (90 Base) MCG/ACT inhaler, Inhale 2 puffs into the lungs every 6 (six) hours as needed for wheezing or shortness of breath., Disp: 18 g, Rfl: 0   baclofen  (LIORESAL) 10 MG tablet, Take 1 tablet (10 mg total) by mouth 3 (three) times daily., Disp: 270 tablet, Rfl: 3   cetirizine (ZYRTEC ALLERGY) 10 MG tablet, Take 1 tablet (10 mg total) by mouth daily., Disp: 30 tablet, Rfl: 0   fentaNYL (DURAGESIC) 25 MCG/HR, Place 1 patch onto the skin every 3 (three) days., Disp: 10 patch, Rfl: 0   hydrOXYzine (ATARAX) 25 MG tablet, TAKE 1 TABLET BY MOUTH EVERYDAY AT BEDTIME, Disp: 90 tablet, Rfl: 2   linaclotide (LINZESS) 290 MCG CAPS capsule, Take 1 capsule (290 mcg total) by mouth daily before breakfast., Disp: 30 capsule, Rfl: 3   meloxicam (MOBIC) 15 MG tablet, TAKE 1 TABLET (15 MG TOTAL) BY MOUTH DAILY AS NEEDED (FOR HIP PAIN FLARE UPS)., Disp: 30 tablet, Rfl: 2   metoCLOPramide (REGLAN) 5 MG tablet, Take 2 tabs po bid, Disp: 120 tablet, Rfl: 3   nystatin cream (MYCOSTATIN), Apply topically 2 (two) times daily., Disp: , Rfl:    pantoprazole (PROTONIX) 40 MG tablet, TAKE 1 TABLET BY MOUTH EVERY DAY, Disp: 90 tablet, Rfl: 1   pregabalin (LYRICA) 150 MG capsule, TAKE ONE CAPSULE BY MOUTH AT BEDTIME, Disp: 90 capsule, Rfl: 1   sucralfate (CARAFATE)  1 GM/10ML suspension, TAKE 10 MLS (1 G TOTAL) BY MOUTH 2 (TWO) TIMES DAILY AS NEEDED, Disp: 600 mL, Rfl: 0   TECFIDERA 240 MG CPDR, TAKE 1 CAPSULE (240MG ) BY MOUTH  TWICE DAILY, Disp: 180 capsule, Rfl: 5   traZODone (DESYREL) 100 MG tablet, TAKE 1 TABLET BY MOUTH EVERYDAY AT BEDTIME, Disp: 90 tablet, Rfl: 1   [facility] TUSSIN DM MAX ST 20-400 MG/20ML LIQD, Take by mouth. (Patient not taking: Reported on 09/19/2022), Disp: , Rfl:    drospirenone-ethinyl estradiol (YAZ) 3-0.02 MG tablet, Take 1 tablet by mouth daily., Disp: 3 tablet, Rfl: 2   FLUoxetine (PROZAC) 40 MG capsule, One capsule po qd, Disp: 90 capsule, Rfl: 1   promethazine-dextromethorphan (PROMETHAZINE-DM) 6.25-15 MG/5ML syrup, Take 5 mLs by mouth 3 (three) times daily as needed for cough. (Patient not taking: Reported on 09/19/2022), Disp: 200 mL, Rfl: 0   Vitamin  D, Ergocalciferol, (DRISDOL) 50000 units CAPS capsule, Take 50,000 Units by mouth 2 (two) times a week. (Patient not taking: Reported on 05/08/2022), Disp: , Rfl:    Allergies  Allergen Reactions   Penicillins Hives   Temazepam Nausea Only    Sharp pains in stomach   Amoxicillin Rash   Septra [Sulfamethoxazole-Trimethoprim] Rash      The patient states she uses OCP (estrogen/progesterone) for birth control. No LMP recorded (exact date). (Menstrual status: Oral contraceptives).. Negative for Dysmenorrhea. Negative for: breast discharge, breast lump(s), breast pain and breast self exam. Associated symptoms include abnormal vaginal bleeding. Pertinent negatives include abnormal bleeding (hematology), anxiety, decreased libido, depression, difficulty falling sleep, dyspareunia, history of infertility, nocturia, sexual dysfunction, sleep disturbances, urinary incontinence, urinary urgency, vaginal discharge and vaginal itching. Diet regular.The patient states her exercise level is  intermittent.  . The patient's tobacco use is:  Social History   Tobacco Use  Smoking Status Former   Types: E-cigarettes   Start date: 02/05/2020   Passive exposure: Never  Smokeless Tobacco Never  Tobacco Comments   Vapes some but not on a regular basis. 06/18/21. Hsm.   . She has been exposed to passive smoke. The patient's alcohol use is:  Social History   Substance and Sexual Activity  Alcohol Use Yes   Comment: socially    Review of Systems  Constitutional:  Positive for unexpected weight change.  HENT: Negative.    Eyes: Negative.   Respiratory: Negative.    Cardiovascular: Negative.   Gastrointestinal: Negative.   Endocrine: Negative.   Genitourinary: Negative.   Musculoskeletal: Negative.   Skin: Negative.   Allergic/Immunologic: Negative.   Neurological: Negative.   Hematological: Negative.   Psychiatric/Behavioral: Negative.       Today's Vitals   09/19/22 1102  BP: 118/82  Pulse: 70   Temp: 98 F (36.7 C)  SpO2: 98%  Weight: 269 lb 9.6 oz (122.3 kg)  Height: 5\' 6"  (1.676 m)  PainSc: 0-No pain   Body mass index is 43.51 kg/m.  Wt Readings from Last 3 Encounters:  09/19/22 269 lb 9.6 oz (122.3 kg)  05/08/22 260 lb (117.9 kg)  04/11/22 265 lb (120.2 kg)     Objective:  Physical Exam Vitals and nursing note reviewed.  Constitutional:      Appearance: Normal appearance. She is obese.  HENT:     Head: Normocephalic and atraumatic.     Right Ear: Tympanic membrane, ear canal and external ear normal.     Left Ear: Tympanic membrane, ear canal and external ear normal.     Nose: Nose  normal.     Mouth/Throat:     Mouth: Mucous membranes are moist.     Pharynx: Oropharynx is clear.  Eyes:     Extraocular Movements: Extraocular movements intact.     Conjunctiva/sclera: Conjunctivae normal.     Pupils: Pupils are equal, round, and reactive to light.  Cardiovascular:     Rate and Rhythm: Normal rate and regular rhythm.     Pulses: Normal pulses.     Heart sounds: Normal heart sounds.  Pulmonary:     Effort: Pulmonary effort is normal.     Breath sounds: Normal breath sounds.  Chest:  Breasts:    Tanner Score is 5.     Left: Normal.  Abdominal:     General: Bowel sounds are normal. There is distension.     Palpations: Abdomen is soft.     Comments: Rounded, soft  Genitourinary:    Comments: deferred Musculoskeletal:        General: Normal range of motion.     Cervical back: Normal range of motion and neck supple.  Skin:    General: Skin is warm and dry.  Neurological:     Mental Status: She is alert and oriented to person, place, and time. Mental status is at baseline.  Psychiatric:        Mood and Affect: Mood normal.        Behavior: Behavior normal.         Assessment And Plan:     Encounter for annual health examination Assessment & Plan: A full exam was performed.  Importance of monthly self breast exams was discussed with the patient.   She is advised to get 3045 minutes of regular exercise, no less than four to five days per week. Both weight-bearing and aerobic exercises are recommended.  She is advised to follow a healthy diet with at least six fruits/veggies per day, decrease intake of red meat and other saturated fats and to increase fish intake to twice weekly.  Meats/fish should not be fried -- baked, boiled or broiled is preferable. It is also important to cut back on your sugar intake.  Be sure to read labels - try to avoid anything with added sugar, high fructose corn syrup or other sweeteners.  If you must use a sweetener, you can try stevia or monkfruit.  It is also important to avoid artificially sweetened foods/beverages and diet drinks. Lastly, wear SPF 50 sunscreen on exposed skin and when in direct sunlight for an extended period of time.  Be sure to avoid fast food restaurants and aim for at least 60 ounces of water daily.      Orders: -     CBC -     CMP14+EGFR -     Lipid panel -     Ambulatory referral to Obstetrics / Gynecology  Multiple sclerosis [facility]) Assessment & Plan: Chronic, followed by Neuro. Most recent Neuro note reviewed. She will continue with baclofen and Tecfidera as per Neuro. She is encouraged to gradually increase her daily activity.    Nondiabetic gastroparesis Assessment & Plan: Chronic, initially diagnosed at [facility].  She is advised to eat small, frequent meals throughout the day. She should definitely stop eating 3 hrs prior to lying down. Therefore, she is not eligible for GLP-1 meds to address obesity.   Orders: -     Ambulatory referral to Gastroenterology  Current moderate episode of major depressive disorder without prior episode [facility]) Assessment & Plan: Chronic, she will continue  with fluoxetine 40mg  daily.   PHQ-9 performed, score 7.  She feels her sx are stable. She feels that her weight is contributing to her sx.   Orders: -     Ambulatory referral to  Psychology  Gastroesophageal reflux disease without esophagitis Assessment & Plan: Chronic, reminded to stop eating at least 3 hrs prior to lying down. I will also refer her to GI as requested.   Orders: -     Ambulatory referral to Gastroenterology  Class 3 severe obesity due to excess calories with serious comorbidity and body mass index (BMI) of 40.0 to 44.9 in adult [facility]) Assessment & Plan: She is encouraged to initially strive for BMI less than 35 to decrease cardiac risk. Advised to aim for at least 150 minutes of exercise per week. She is encouraged to consider aqua aerobics and/or walking in a pool.    Other orders -     FLUoxetine HCl; One capsule po qd  Dispense: 90 capsule; Refill: 1  She is encouraged to strive for BMI less than 30 to decrease cardiac risk. Advised to aim for at least 150 minutes of exercise per week.    Return in 6 months (on 03/22/2023), or med check, for 1 year physical. Patient was given opportunity to ask questions. Patient verbalized understanding of the plan and was able to repeat key elements of the plan. All questions were answered to their satisfaction.    I, Gwynneth Aliment, MD, have reviewed all documentation for this visit. The documentation on 09/19/22 for the exam, diagnosis, procedures, and orders are all accurate and complete.

## 2022-09-20 LAB — CMP14+EGFR
ALT: 11 IU/L (ref 0–32)
AST: 11 IU/L (ref 0–40)
Albumin: 4.2 g/dL (ref 3.9–4.9)
Alkaline Phosphatase: 79 IU/L (ref 44–121)
BUN/Creatinine Ratio: 11 (ref 9–23)
BUN: 11 mg/dL (ref 6–24)
Bilirubin Total: <0.2 mg/dL (ref 0.0–1.2)
CO2: 23 mmol/L (ref 20–29)
Calcium: 9.8 mg/dL (ref 8.7–10.2)
Chloride: 102 mmol/L (ref 96–106)
Creatinine, Ser: 0.99 mg/dL (ref 0.57–1.00)
Globulin, Total: 3.1 g/dL (ref 1.5–4.5)
Glucose: 97 mg/dL (ref 70–99)
Potassium: 4.2 mmol/L (ref 3.5–5.2)
Sodium: 139 mmol/L (ref 134–144)
Total Protein: 7.3 g/dL (ref 6.0–8.5)
eGFR: 73 mL/min/{1.73_m2} (ref >59)

## 2022-09-20 LAB — CBC
Hematocrit: 34.4 % (ref 34.0–46.6)
Hemoglobin: 11.8 g/dL (ref 11.1–15.9)
MCH: 29.1 pg (ref 26.6–33.0)
MCHC: 34.3 g/dL (ref 31.5–35.7)
MCV: 85 fL (ref 79–97)
Platelets: 244 10*3/uL (ref 150–450)
RBC: 4.06 x10E6/uL (ref 3.77–5.28)
RDW: 13.7 % (ref 11.7–15.4)
WBC: 5.7 10*3/uL (ref 3.4–10.8)

## 2022-09-20 LAB — LIPID PANEL
Chol/HDL Ratio: 2.3 ratio (ref 0.0–4.4)
Cholesterol, Total: 220 mg/dL — ABNORMAL HIGH (ref 100–199)
HDL: 95 mg/dL (ref >39)
LDL Chol Calc (NIH): 95 mg/dL (ref 0–99)
Triglycerides: 179 mg/dL — ABNORMAL HIGH (ref 0–149)
VLDL Cholesterol Cal: 30 mg/dL (ref 5–40)

## 2022-09-27 ENCOUNTER — Other Ambulatory Visit: Payer: Self-pay | Admitting: Neurology

## 2022-09-27 NOTE — Telephone Encounter (Signed)
Pt refill request for pregabalin (LYRICA) [facility] MG [facility] [phone]

## 2022-09-29 DIAGNOSIS — Z0000 Encounter for general adult medical examination without abnormal findings: Secondary | ICD-10-CM | POA: Insufficient documentation

## 2022-09-29 NOTE — Assessment & Plan Note (Addendum)
Chronic, initially diagnosed at [facility].  She is advised to eat small, frequent meals throughout the day. She should definitely stop eating 3 hrs prior to lying down. Therefore, she is not eligible for GLP-1 meds to address obesity.

## 2022-09-29 NOTE — Assessment & Plan Note (Signed)
Chronic, reminded to stop eating at least 3 hrs prior to lying down. I will also refer her to GI as requested.

## 2022-09-29 NOTE — Assessment & Plan Note (Signed)
Chronic, she will continue with fluoxetine 40mg  daily.   PHQ-9 performed, score 7.  She feels her sx are stable. She feels that her weight is contributing to her sx.

## 2022-09-29 NOTE — Assessment & Plan Note (Signed)
Chronic, followed by Neuro. Most recent Neuro note reviewed. She will continue with baclofen and Tecfidera as per Neuro. She is encouraged to gradually increase her daily activity.

## 2022-09-29 NOTE — Assessment & Plan Note (Signed)

## 2022-09-29 NOTE — Assessment & Plan Note (Addendum)
She is encouraged to initially strive for BMI less than 35 to decrease cardiac risk. Advised to aim for at least 150 minutes of exercise per week. She is encouraged to consider aqua aerobics and/or walking in a pool.

## 2022-10-02 MED ORDER — PREGABALIN 150 MG PO CAPS: ORAL_CAPSULE | ORAL | 1 refills | Status: DC

## 2022-10-02 NOTE — Telephone Encounter (Signed)
Pt last seen on 04/11/22 Follow up scheduled on 10/16/22 Last filled on 06/22/22 #90 tablets (90 day supply) Rx pending to be signed.

## 2022-10-02 NOTE — Telephone Encounter (Signed)
The refill request does not show to have been properly routed, sending to POD 1

## 2022-10-04 ENCOUNTER — Telehealth: Payer: Self-pay | Admitting: Neurology

## 2022-10-04 ENCOUNTER — Other Ambulatory Visit: Payer: Self-pay

## 2022-10-04 MED ORDER — FENTANYL 25 MCG/HR TD PT72: 1.0000 | MEDICATED_PATCH | TRANSDERMAL | 0 refills | Status: DC

## 2022-10-04 NOTE — Telephone Encounter (Addendum)
Last seen 04/11/2022 Upcoming appointment 10/16/2022  Sent script to Dr. Epimenio Foot

## 2022-10-04 NOTE — Telephone Encounter (Signed)
Pt request refill for fentaNYL (DURAGESIC) 25 MCG/HR [facility] [phone]

## 2022-10-15 NOTE — Patient Instructions (Signed)
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
## 2022-10-16 ENCOUNTER — Other Ambulatory Visit: Payer: Self-pay | Admitting: Internal Medicine

## 2022-10-16 ENCOUNTER — Encounter: Payer: Self-pay | Admitting: Family Medicine

## 2022-10-16 ENCOUNTER — Ambulatory Visit (INDEPENDENT_AMBULATORY_CARE_PROVIDER_SITE_OTHER): Payer: 59 | Admitting: Family Medicine

## 2022-10-16 VITALS — BP 160/100 | HR 72 | Ht 66.0 in | Wt 277.5 lb

## 2022-10-16 DIAGNOSIS — E559 Vitamin D deficiency, unspecified: Secondary | ICD-10-CM

## 2022-10-16 DIAGNOSIS — M25552 Pain in left hip: Secondary | ICD-10-CM

## 2022-10-16 DIAGNOSIS — M25551 Pain in right hip: Secondary | ICD-10-CM

## 2022-10-16 DIAGNOSIS — G8929 Other chronic pain: Secondary | ICD-10-CM

## 2022-10-16 DIAGNOSIS — R5382 Chronic fatigue, unspecified: Secondary | ICD-10-CM

## 2022-10-16 DIAGNOSIS — Z79899 Other long term (current) drug therapy: Secondary | ICD-10-CM | POA: Diagnosis not present

## 2022-10-16 DIAGNOSIS — G35 Multiple sclerosis: Secondary | ICD-10-CM

## 2022-10-23 ENCOUNTER — Other Ambulatory Visit: Payer: Self-pay | Admitting: Internal Medicine

## 2022-10-28 ENCOUNTER — Other Ambulatory Visit: Payer: Self-pay

## 2022-10-28 MED ORDER — NYSTATIN 100000 UNIT/GM EX CREA: TOPICAL_CREAM | Freq: Two times a day (BID) | CUTANEOUS | 1 refills | Status: AC

## 2022-11-05 ENCOUNTER — Other Ambulatory Visit: Payer: Self-pay | Admitting: Family Medicine

## 2022-11-05 MED ORDER — FENTANYL 25 MCG/HR TD PT72: 1.0000 | MEDICATED_PATCH | TRANSDERMAL | 0 refills | Status: DC

## 2022-11-05 NOTE — Telephone Encounter (Signed)
Last seen 10/16/22 and next f/u 05/12/23. Last refilled 10/04/22 #10.

## 2022-11-05 NOTE — Telephone Encounter (Signed)
Pt requesting a refill for fentaNYL (DURAGESIC) 25 MCG/HR [phone]

## 2022-11-14 ENCOUNTER — Other Ambulatory Visit: Payer: Self-pay | Admitting: Internal Medicine

## 2022-12-05 ENCOUNTER — Other Ambulatory Visit: Payer: Self-pay | Admitting: Family Medicine

## 2022-12-05 MED ORDER — FENTANYL 25 MCG/HR TD PT72: 1.0000 | MEDICATED_PATCH | TRANSDERMAL | 0 refills | Status: DC

## 2022-12-05 NOTE — Telephone Encounter (Signed)
Pt called for her  fentaNYL (DURAGESIC) 25 MCG/HR refill that [facility]

## 2022-12-05 NOTE — Telephone Encounter (Signed)
   Last appointment: 10/16/2022 Next appointment: 05/12/2023

## 2022-12-12 ENCOUNTER — Other Ambulatory Visit: Payer: Self-pay | Admitting: Internal Medicine

## 2022-12-30 ENCOUNTER — Other Ambulatory Visit: Payer: Self-pay | Admitting: Internal Medicine

## 2022-12-30 MED ORDER — METOCLOPRAMIDE HCL 5 MG PO TABS: ORAL_TABLET | ORAL | 3 refills | Status: DC

## 2023-01-03 ENCOUNTER — Other Ambulatory Visit: Payer: Self-pay | Admitting: Internal Medicine

## 2023-01-06 ENCOUNTER — Other Ambulatory Visit: Payer: Self-pay | Admitting: Family Medicine

## 2023-01-06 MED ORDER — FENTANYL 25 MCG/HR TD PT72: 1.0000 | MEDICATED_PATCH | TRANSDERMAL | 0 refills | Status: DC

## 2023-01-06 NOTE — Telephone Encounter (Signed)
Last seen 10/16/22 and next f/u 05/12/23. Last refilled 12/05/22 #10.

## 2023-01-06 NOTE — Telephone Encounter (Signed)
Pt request refill for fentaNYL (DURAGESIC) 25 MCG/HR [phone]

## 2023-01-07 ENCOUNTER — Other Ambulatory Visit: Payer: Self-pay | Admitting: Internal Medicine

## 2023-01-15 ENCOUNTER — Other Ambulatory Visit: Payer: Self-pay | Admitting: Internal Medicine

## 2023-01-21 ENCOUNTER — Encounter: Payer: Self-pay | Admitting: Nurse Practitioner

## 2023-01-21 ENCOUNTER — Ambulatory Visit: Payer: 59 | Admitting: Nurse Practitioner

## 2023-01-21 VITALS — BP 120/80 | HR 82 | Temp 98.5°F | Ht 66.0 in | Wt 272.4 lb

## 2023-01-21 DIAGNOSIS — R0981 Nasal congestion: Secondary | ICD-10-CM

## 2023-01-21 DIAGNOSIS — R0982 Postnasal drip: Secondary | ICD-10-CM | POA: Diagnosis not present

## 2023-01-21 DIAGNOSIS — H1032 Unspecified acute conjunctivitis, left eye: Secondary | ICD-10-CM | POA: Diagnosis not present

## 2023-01-21 DIAGNOSIS — Z2821 Immunization not carried out because of patient refusal: Secondary | ICD-10-CM | POA: Diagnosis not present

## 2023-01-21 MED ORDER — BACITRACIN-POLYMYXIN B 500-10000 UNIT/GM OP OINT
TOPICAL_OINTMENT | Freq: Two times a day (BID) | OPHTHALMIC | 0 refills | Status: AC
Start: 2023-01-21 — End: 2023-01-31

## 2023-01-21 NOTE — Assessment & Plan Note (Signed)
She is to take antihistamine daily

## 2023-01-21 NOTE — Assessment & Plan Note (Signed)
Erythema noted to left eye without any crusting. Will treat with antibiotic ointment

## 2023-01-21 NOTE — Assessment & Plan Note (Signed)

## 2023-01-21 NOTE — Assessment & Plan Note (Signed)

## 2023-01-21 NOTE — Progress Notes (Signed)
Madelaine Bhat, CMA,acting as a Neurosurgeon for Arnette Felts, FNP.,have documented all relevant documentation on the behalf of Arnette Felts, FNP,as directed by  Arnette Felts, FNP while in the presence of Arnette Felts, FNP.  Subjective:  Patient ID: Joy Cobb , female    DOB: 09-12-1980 , 42 y.o.   MRN: [id number]  Chief Complaint  Patient presents with   Conjunctivitis   Sinus Problem    HPI  Patient presents today for possible pink eye and sinus issues for 4 days, patient reports having pain and crust in her left eye. This morning was not that bad, wiped with her eye.  Patient reports having post nasal drip that makes her throat hurt, she reports a stuffy nose but only at home. She reports "dark stuff" when coughing. Patient reports this first started 3 days ago. She has been using an herbal tea - which has helped some of her symptoms. She has thick brownish mucous when she coughs. This can occur any time of the day. She is no longer vaping as of 7 months ago. She has taken an Catering manager and sinus. She has not used any eye drops. She has a history of wheezing when she has bronchitis.     Past Medical History:  Diagnosis Date   Bladder spasms    Colonic inertia    GERD (gastroesophageal reflux disease)    IBS (irritable bowel syndrome)    Insomnia    Multiple sclerosis (HCC)    Neuropathy    Vision abnormalities      Family History  Problem Relation Age of Onset   Neuropathy Mother    Allergic rhinitis Mother    Urticaria Mother    GER disease Mother    Cancer - Other Father    Thyroid disease Maternal Aunt    Breast cancer Maternal Aunt    Parkinson's disease Maternal Aunt    Diabetes Maternal Grandmother    Hypertension Maternal Grandmother    Kidney failure Maternal Grandmother    Parkinson's disease Maternal Grandmother    Kidney disease Maternal Grandfather    Hyperlipidemia Paternal Grandmother    Hyperlipidemia Paternal Grandfather    Colon cancer Other     Asthma Neg Hx    Eczema Neg Hx    Esophageal cancer Neg Hx    Rectal cancer Neg Hx    Stomach cancer Neg Hx      Current Outpatient Medications:    AFLURIA QUADRIVALENT 0.5 ML injection, , Disp: , Rfl:    albuterol (VENTOLIN HFA) 108 (90 Base) MCG/ACT inhaler, Inhale 2 puffs into the lungs every 6 (six) hours as needed for wheezing or shortness of breath., Disp: 18 g, Rfl: 0   bacitracin-polymyxin b (POLYSPORIN) ophthalmic ointment, Place into both eyes 2 (two) times daily for 10 days. Place a 1/2 inch ribbon of ointment into the lower eyelid., Disp: 3.5 g, Rfl: 0   baclofen (LIORESAL) 10 MG tablet, Take 1 tablet (10 mg total) by mouth 3 (three) times daily., Disp: 270 tablet, Rfl: 3   cetirizine (ZYRTEC ALLERGY) 10 MG tablet, Take 1 tablet (10 mg total) by mouth daily., Disp: 30 tablet, Rfl: 0   [facility] TUSSIN DM MAX ST 20-400 MG/20ML LIQD, Take by mouth., Disp: , Rfl:    fentaNYL (DURAGESIC) 25 MCG/HR, Place 1 patch onto the skin every 3 (three) days., Disp: 10 patch, Rfl: 0   FLUoxetine (PROZAC) 40 MG capsule, One capsule po qd, Disp: 90 capsule, Rfl: 1  hydrOXYzine (ATARAX) 25 MG tablet, TAKE 1 TABLET BY MOUTH EVERYDAY AT BEDTIME, Disp: 90 tablet, Rfl: 2   linaclotide (LINZESS) 290 MCG CAPS capsule, Take 1 capsule (290 mcg total) by mouth daily before breakfast., Disp: 30 capsule, Rfl: 3   meloxicam (MOBIC) 15 MG tablet, TAKE 1 TABLET (15 MG TOTAL) BY MOUTH DAILY AS NEEDED (FOR HIP PAIN FLARE UPS)., Disp: 30 tablet, Rfl: 2   metoCLOPramide (REGLAN) 5 MG tablet, Take 2 tabs po bid, Disp: 120 tablet, Rfl: 3   NIKKI 3-0.02 MG tablet, TAKE 1 TABLET BY MOUTH EVERY DAY, Disp: 84 tablet, Rfl: 2   nystatin cream (MYCOSTATIN), Apply topically 2 (two) times daily., Disp: 30 g, Rfl: 1   pantoprazole (PROTONIX) 40 MG tablet, TAKE 1 TABLET BY MOUTH EVERY DAY, Disp: 90 tablet, Rfl: 1   pregabalin (LYRICA) 150 MG capsule, TAKE ONE CAPSULE BY MOUTH AT BEDTIME, Disp: 90 capsule, Rfl: 1   sucralfate  (CARAFATE) 1 GM/10ML suspension, TAKE 10 MLS (1 G TOTAL) BY MOUTH 2 (TWO) TIMES DAILY AS NEEDED, Disp: 600 mL, Rfl: 0   TECFIDERA 240 MG CPDR, TAKE 1 CAPSULE (240MG ) BY MOUTH  TWICE DAILY, Disp: 180 capsule, Rfl: 5   traZODone (DESYREL) 100 MG tablet, TAKE 1 TABLET BY MOUTH EVERYDAY AT BEDTIME, Disp: 90 tablet, Rfl: 1   Vitamin D, Ergocalciferol, (DRISDOL) 50000 units CAPS capsule, Take 50,000 Units by mouth 2 (two) times a week., Disp: , Rfl:    Allergies  Allergen Reactions   Penicillins Hives   Temazepam Nausea Only    Sharp pains in stomach   Amoxicillin Rash   Septra [Sulfamethoxazole-Trimethoprim] Rash     Review of Systems  Constitutional: Negative.   HENT:  Positive for congestion, postnasal drip and sore throat.   Eyes:  Positive for pain and discharge.  Respiratory: Negative.    Cardiovascular: Negative.   Gastrointestinal: Negative.   Neurological: Negative.   Psychiatric/Behavioral: Negative.       Today's Vitals   01/21/23 1445  BP: 120/80  Pulse: 82  Temp: 98.5 F (36.9 C)  TempSrc: Oral  Weight: 272 lb 6.4 oz (123.6 kg)  Height: 5\' 6"  (1.676 m)  PainSc: 0-No pain   Body mass index is 43.97 kg/m.  Wt Readings from Last 3 Encounters:  01/21/23 272 lb 6.4 oz (123.6 kg)  10/16/22 277 lb 8 oz (125.9 kg)  09/19/22 269 lb 9.6 oz (122.3 kg)     Objective:  Physical Exam Vitals reviewed.  Constitutional:      General: She is not in acute distress.    Appearance: Normal appearance. She is obese.  Cardiovascular:     Rate and Rhythm: Normal rate and regular rhythm.     Pulses: Normal pulses.     Heart sounds: Normal heart sounds. No murmur heard. Pulmonary:     Effort: Pulmonary effort is normal. No respiratory distress.     Breath sounds: Normal breath sounds. No wheezing.  Neurological:     General: No focal deficit present.     Mental Status: She is alert and oriented to person, place, and time.     Cranial Nerves: No cranial nerve deficit.      Motor: No weakness.  Psychiatric:        Mood and Affect: Mood normal.        Behavior: Behavior normal.        Thought Content: Thought content normal.        Judgment: Judgment normal.  Assessment And Plan:  Acute bacterial conjunctivitis of left eye Assessment & Plan: Erythema noted to left eye without any crusting. Will treat with antibiotic ointment  Orders: -     Bacitracin-Polymyxin B; Place into both eyes 2 (two) times daily for 10 days. Place a 1/2 inch ribbon of ointment into the lower eyelid.  Dispense: 3.5 g; Refill: 0  Post-nasal drip Assessment & Plan: She is to take antihistamine daily   Stuffy nose  Influenza vaccination declined Assessment & Plan: Patient declined influenza vaccination at this time. Patient is aware that influenza vaccine prevents illness in 70% of healthy people, and reduces hospitalizations to 30-70% in elderly. This vaccine is recommended annually. Education has been provided regarding the importance of this vaccine but patient still declined. Advised may receive this vaccine at local pharmacy or Health Dept.or vaccine clinic. Aware to provide a copy of the vaccination record if obtained from local pharmacy or Health Dept.  Pt is willing to accept risk associated with refusing vaccination.    COVID-19 vaccination declined Assessment & Plan: Declines covid 19 vaccine. Discussed risk of covid 50 and if she changes her mind about the vaccine to call the office. Education has been provided regarding the importance of this vaccine but patient still declined. Advised may receive this vaccine at local pharmacy or Health Dept.or vaccine clinic. Aware to provide a copy of the vaccination record if obtained from local pharmacy or Health Dept.  Encouraged to take multivitamin, vitamin d, vitamin c and zinc to increase immune system. Aware can call office if would like to have vaccine here at office. Verbalized acceptance and  understanding.     Return for keep same next..  Patient was given opportunity to ask questions. Patient verbalized understanding of the plan and was able to repeat key elements of the plan. All questions were answered to their satisfaction.    Jeanell Sparrow, FNP, have reviewed all documentation for this visit. The documentation on 01/21/23 for the exam, diagnosis, procedures, and orders are all accurate and complete.   IF YOU HAVE BEEN REFERRED TO A SPECIALIST, IT MAY TAKE 1-2 WEEKS TO SCHEDULE/PROCESS THE REFERRAL. IF YOU HAVE NOT HEARD FROM US/SPECIALIST IN TWO WEEKS, PLEASE GIVE [place] A CALL AT [phone].

## 2023-02-03 NOTE — Telephone Encounter (Signed)
Pt requesting refill of fentaNYL (DURAGESIC) 25 MCG/HR  [facility] [phone]

## 2023-02-06 ENCOUNTER — Other Ambulatory Visit: Payer: Self-pay | Admitting: Family Medicine

## 2023-02-06 MED ORDER — FENTANYL 25 MCG/HR TD PT72: 1.0000 | MEDICATED_PATCH | TRANSDERMAL | 0 refills | Status: DC

## 2023-02-06 NOTE — Telephone Encounter (Signed)
 Since Monday pt has been trying to get her fentaNYL (DURAGESIC) 25 MCG/HR [facility] [phone]

## 2023-02-06 NOTE — Telephone Encounter (Signed)
 Last visit: 10/16/22 Next visit: 05/12/23  Per epic adherence report, last filled:   Rx refill sent to Dr Epimenio Foot

## 2023-02-06 NOTE — Telephone Encounter (Signed)
 I called the pt and let her know. She was very Adult nurse.

## 2023-02-12 ENCOUNTER — Other Ambulatory Visit: Payer: Self-pay | Admitting: Internal Medicine

## 2023-02-25 ENCOUNTER — Other Ambulatory Visit: Payer: Self-pay | Admitting: Internal Medicine

## 2023-03-06 ENCOUNTER — Other Ambulatory Visit: Payer: Self-pay | Admitting: Family Medicine

## 2023-03-06 MED ORDER — FENTANYL 25 MCG/HR TD PT72: 1.0000 | MEDICATED_PATCH | TRANSDERMAL | 0 refills | Status: DC

## 2023-03-06 NOTE — Telephone Encounter (Signed)
Last seen 10/16/22 and next f/u 05/12/23. Last refilled 02/06/23 #10.

## 2023-03-06 NOTE — Telephone Encounter (Signed)
Pt request refill for fentaNYL (DURAGESIC) 25 MCG/HR [facility]  [facility] [id number]

## 2023-03-16 ENCOUNTER — Other Ambulatory Visit: Payer: Self-pay | Admitting: Internal Medicine

## 2023-03-25 ENCOUNTER — Ambulatory Visit (INDEPENDENT_AMBULATORY_CARE_PROVIDER_SITE_OTHER): Payer: 59 | Admitting: Internal Medicine

## 2023-03-25 ENCOUNTER — Encounter: Payer: Self-pay | Admitting: Internal Medicine

## 2023-03-25 VITALS — BP 120/82 | HR 85 | Temp 98.2°F | Wt 283.0 lb

## 2023-03-25 DIAGNOSIS — F321 Major depressive disorder, single episode, moderate: Secondary | ICD-10-CM

## 2023-03-25 DIAGNOSIS — Z79899 Other long term (current) drug therapy: Secondary | ICD-10-CM

## 2023-03-25 DIAGNOSIS — L219 Seborrheic dermatitis, unspecified: Secondary | ICD-10-CM | POA: Diagnosis not present

## 2023-03-25 DIAGNOSIS — R635 Abnormal weight gain: Secondary | ICD-10-CM | POA: Insufficient documentation

## 2023-03-25 DIAGNOSIS — G35 Multiple sclerosis: Secondary | ICD-10-CM

## 2023-03-25 MED ORDER — TRIAMCINOLONE ACETONIDE 0.1 % EX CREA: TOPICAL_CREAM | CUTANEOUS | 0 refills | Status: AC

## 2023-03-25 MED ORDER — LINACLOTIDE 290 MCG PO CAPS: 290.0000 ug | ORAL_CAPSULE | Freq: Every day | ORAL | 3 refills | Status: DC | PRN

## 2023-03-25 NOTE — Assessment & Plan Note (Signed)
Pt advised to use lower potency steroid cream on her face. I will send rx 0.1% triamcinolone cream, advised to use sparingly on affected areas.

## 2023-03-25 NOTE — Assessment & Plan Note (Addendum)
She has gained 11lbs since December 2024,  admits she is not exercising as much as she should. Encouraged to gradually build up to 30 minutes five days per week. She agrees to referral to [employer] program.

## 2023-03-25 NOTE — Assessment & Plan Note (Signed)
Chronic, followed by Neuro. Most recent Neuro note reviewed. She will continue with baclofen and Tecfidera as per Neuro. She is encouraged to gradually increase her daily activity.

## 2023-03-25 NOTE — Assessment & Plan Note (Signed)
Chronic, she will continue with fluoxetine 40mg  daily.   PHQ-9 performed, she feels her sx are stable. She feels that her weight is contributing to her sx.

## 2023-03-25 NOTE — Progress Notes (Signed)
I,Joy Cobb, CMA,acting as a Neurosurgeon for Joy Aliment, MD.,have documented all relevant documentation on the behalf of Joy Aliment, MD,as directed by  Joy Aliment, MD while in the presence of Joy Aliment, MD.  Subjective:  Patient ID: Joy Cobb , female    DOB: 1980/02/13 , 43 y.o.   MRN: [id number]  Chief Complaint  Patient presents with   Depression    HPI  Patient presents today for a med check. Patient reports compliance with her meds. Patient denies having chest pain, sob or headaches at this time. Patinet would like a refill on her triamcinolone cream she stated she uses it for her face when it breaks out.  Depression        This is a chronic problem.  The current episode started more than 1 year ago.   The onset quality is gradual.   Associated symptoms include fatigue.    Past Medical History:  Diagnosis Date   Bladder spasms    Colonic inertia    GERD (gastroesophageal reflux disease)    IBS (irritable bowel syndrome)    Insomnia    Multiple sclerosis (HCC)    Neuropathy    Vision abnormalities      Family History  Problem Relation Age of Onset   Neuropathy Mother    Allergic rhinitis Mother    Urticaria Mother    GER disease Mother    Cancer - Other Father    Thyroid disease Maternal Aunt    Breast cancer Maternal Aunt    Parkinson's disease Maternal Aunt    Diabetes Maternal Grandmother    Hypertension Maternal Grandmother    Kidney failure Maternal Grandmother    Parkinson's disease Maternal Grandmother    Kidney disease Maternal Grandfather    Hyperlipidemia Paternal Grandmother    Hyperlipidemia Paternal Grandfather    Colon cancer Other    Asthma Neg Hx    Eczema Neg Hx    Esophageal cancer Neg Hx    Rectal cancer Neg Hx    Stomach cancer Neg Hx      Current Outpatient Medications:    albuterol (VENTOLIN HFA) 108 (90 Base) MCG/ACT inhaler, Inhale 2 puffs into the lungs every 6 (six) hours as needed for wheezing or  shortness of breath., Disp: 18 g, Rfl: 0   baclofen (LIORESAL) 10 MG tablet, Take 1 tablet (10 mg total) by mouth 3 (three) times daily., Disp: 270 tablet, Rfl: 3   cetirizine (ZYRTEC ALLERGY) 10 MG tablet, Take 1 tablet (10 mg total) by mouth daily., Disp: 30 tablet, Rfl: 0   fentaNYL (DURAGESIC) 25 MCG/HR, Place 1 patch onto the skin every 3 (three) days., Disp: 10 patch, Rfl: 0   FLUoxetine (PROZAC) 40 MG capsule, TAKE 1 CAPSULE BY MOUTH EVERY DAY, Disp: 90 capsule, Rfl: 1   hydrOXYzine (ATARAX) 25 MG tablet, TAKE 1 TABLET BY MOUTH EVERYDAY AT BEDTIME, Disp: 90 tablet, Rfl: 2   meloxicam (MOBIC) 15 MG tablet, TAKE 1 TABLET (15 MG TOTAL) BY MOUTH DAILY AS NEEDED (FOR HIP PAIN FLARE UPS)., Disp: 30 tablet, Rfl: 2   metoCLOPramide (REGLAN) 5 MG tablet, Take 2 tabs po bid, Disp: 120 tablet, Rfl: 3   NIKKI 3-0.02 MG tablet, TAKE 1 TABLET BY MOUTH EVERY DAY, Disp: 84 tablet, Rfl: 2   nystatin cream (MYCOSTATIN), Apply topically 2 (two) times daily., Disp: 30 g, Rfl: 1   pantoprazole (PROTONIX) 40 MG tablet, TAKE 1 TABLET BY MOUTH EVERY DAY, Disp: 90  tablet, Rfl: 1   pregabalin (LYRICA) 150 MG capsule, TAKE ONE CAPSULE BY MOUTH AT BEDTIME, Disp: 90 capsule, Rfl: 1   sucralfate (CARAFATE) 1 GM/10ML suspension, TAKE 10 MLS (1 G TOTAL) BY MOUTH 2 (TWO) TIMES DAILY AS NEEDED, Disp: 1800 mL, Rfl: 1   TECFIDERA 240 MG CPDR, TAKE 1 CAPSULE (240MG ) BY MOUTH  TWICE DAILY, Disp: 180 capsule, Rfl: 5   traZODone (DESYREL) 100 MG tablet, TAKE 1 TABLET BY MOUTH EVERYDAY AT BEDTIME, Disp: 90 tablet, Rfl: 1   triamcinolone cream (KENALOG) 0.1 %, APPLY TO AFFECTED AREA TWICE DAILY AS NEEDED, Disp: 45 g, Rfl: 0   triamcinolone cream (KENALOG) 0.5 %, Apply 1 Application topically daily as needed., Disp: , Rfl:    linaclotide (LINZESS) 290 MCG CAPS capsule, Take 1 capsule (290 mcg total) by mouth daily as needed., Disp: 30 capsule, Rfl: 3   Allergies  Allergen Reactions   Penicillins Hives   Temazepam Nausea Only     Sharp pains in stomach   Amoxicillin Rash   Septra [Sulfamethoxazole-Trimethoprim] Rash     Review of Systems  Constitutional:  Positive for fatigue.  HENT: Negative.    Eyes: Negative.   Respiratory: Negative.    Cardiovascular: Negative.   Gastrointestinal: Negative.   Skin: Negative.   Hematological: Negative.   Psychiatric/Behavioral:  Positive for depression.      Today's Vitals   03/25/23 1153  BP: 120/82  Pulse: 85  Temp: 98.2 F (36.8 C)  TempSrc: Oral  Weight: 283 lb (128.4 kg)  PainSc: 0-No pain   Body mass index is 45.68 kg/m.  Wt Readings from Last 3 Encounters:  03/25/23 283 lb (128.4 kg)  01/21/23 272 lb 6.4 oz (123.6 kg)  10/16/22 277 lb 8 oz (125.9 kg)    The 10-year ASCVD risk score (Arnett DK, et al., 2019) is: 0.2%   Values used to calculate the score:     Age: 74 years     Sex: Female     Is Non-Hispanic African American: Yes     Diabetic: No     Tobacco smoker: No     Systolic Blood Pressure: 120 mmHg     Is BP treated: No     HDL Cholesterol: 95 mg/dL     Total Cholesterol: 220 mg/dL  Objective:  Physical Exam Vitals and nursing note reviewed.  Constitutional:      Appearance: Normal appearance. She is obese.  HENT:     Head: Normocephalic and atraumatic.  Eyes:     Extraocular Movements: Extraocular movements intact.  Cardiovascular:     Rate and Rhythm: Normal rate and regular rhythm.     Heart sounds: Normal heart sounds.  Pulmonary:     Effort: Pulmonary effort is normal.     Breath sounds: Normal breath sounds.  Musculoskeletal:     Cervical back: Normal range of motion.  Skin:    General: Skin is warm.     Comments: She c/o flakiness in her eyebrows. This is a new issue.  She uses triamcinolone 0.5% cream on her dry spots on her body, prescribed by previous MD.  Wants to know if she can use on her face.   Neurological:     General: No focal deficit present.     Mental Status: She is alert.  Psychiatric:        Mood and  Affect: Mood normal.        Behavior: Behavior normal.  Assessment And Plan:  Current moderate episode of major depressive disorder without prior episode Joy Cobb) Assessment & Plan: Chronic, she will continue with fluoxetine 40mg  daily.   PHQ-9 performed, she feels her sx are stable. She feels that her weight is contributing to her sx.   Orders: -     Amb Referral To Provider Referral Exercise Program (P.R.E.P)  Seborrheic dermatitis Assessment & Plan: Pt advised to use lower potency steroid cream on her face. I will send rx 0.1% triamcinolone cream, advised to use sparingly on affected areas.    Multiple sclerosis (HCC) Assessment & Plan: Chronic, followed by Neuro. Most recent Neuro note reviewed. She will continue with baclofen and Tecfidera as per Neuro. She is encouraged to gradually increase her daily activity.    Weight gain Assessment & Plan: She has gained 11lbs since December 2024,  admits she is not exercising as much as she should. Encouraged to gradually build up to 30 minutes five days per week. She agrees to referral to PREP program.   Orders: -     Lipid panel -     Amb Referral To Provider Referral Exercise Program (P.R.E.P) -     Hemoglobin A1c  Drug therapy -     CMP14+EGFR  Other orders -     linaCLOtide; Take 1 capsule (290 mcg total) by mouth daily as needed.  Dispense: 30 capsule; Refill: 3 -     Triamcinolone Acetonide; APPLY TO AFFECTED AREA TWICE DAILY AS NEEDED  Dispense: 45 g; Refill: 0    Return if symptoms worsen or fail to improve.  Patient was given opportunity to ask questions. Patient verbalized understanding of the plan and was able to repeat key elements of the plan. All questions were answered to their satisfaction.    I, Joy Aliment, MD, have reviewed all documentation for this visit. The documentation on 03/25/23 for the exam, diagnosis, procedures, and orders are all accurate and complete.   IF YOU HAVE BEEN REFERRED TO A  SPECIALIST, IT MAY TAKE 1-2 WEEKS TO SCHEDULE/PROCESS THE REFERRAL. IF YOU HAVE NOT HEARD FROM US/SPECIALIST IN TWO WEEKS, PLEASE GIVE [place] A CALL AT [phone].

## 2023-03-25 NOTE — Patient Instructions (Signed)
 Exercising to Stay Healthy To become healthy and stay healthy, it is recommended that you do moderate-intensity and vigorous-intensity exercise. You can tell that you are exercising at a moderate intensity if your heart starts beating faster and you start breathing faster but can still hold a conversation. You can tell that you are exercising at a vigorous intensity if you are breathing much harder and faster and cannot hold a conversation while exercising. How can exercise benefit me? Exercising regularly is important. It has many health benefits, such as: Improving overall fitness, flexibility, and endurance. Increasing bone density. Helping with weight control. Decreasing body fat. Increasing muscle strength and endurance. Reducing stress and tension, anxiety, depression, or anger. Improving overall health. What guidelines should I follow while exercising? Before you start a new exercise program, talk with your health care provider. Do not exercise so much that you hurt yourself, feel dizzy, or get very short of breath. Wear comfortable clothes and wear shoes with good support. Drink plenty of water while you exercise to prevent dehydration or heat stroke. Work out until your breathing and your heartbeat get faster (moderate intensity). How often should I exercise? Choose an activity that you enjoy, and set realistic goals. Your health care provider can help you make an activity plan that is individually designed and works best for you. Exercise regularly as told by your health care provider. This may include: Doing strength training two times a week, such as: Lifting weights. Using resistance bands. Push-ups. Sit-ups. Yoga. Doing a certain intensity of exercise for a given amount of time. Choose from these options: A total of 150 minutes of moderate-intensity exercise every week. A total of 75 minutes of vigorous-intensity exercise every week. A mix of moderate-intensity and  vigorous-intensity exercise every week. Children, pregnant women, people who have not exercised regularly, people who are overweight, and older adults may need to talk with a health care provider about what activities are safe to perform. If you have a medical condition, be sure to talk with your health care provider before you start a new exercise program. What are some exercise ideas? Moderate-intensity exercise ideas include: Walking 1 mile (1.6 km) in about 15 minutes. Biking. Hiking. Golfing. Dancing. Water aerobics. Vigorous-intensity exercise ideas include: Walking 4.5 miles (7.2 km) or more in about 1 hour. Jogging or running 5 miles (8 km) in about 1 hour. Biking 10 miles (16.1 km) or more in about 1 hour. Lap swimming. Roller-skating or in-line skating. Cross-country skiing. Vigorous competitive sports, such as football, basketball, and soccer. Jumping rope. Aerobic dancing. What are some everyday activities that can help me get exercise? Yard work, such as: Child psychotherapist. Raking and bagging leaves. Washing your car. Pushing a stroller. Shoveling snow. Gardening. Washing windows or floors. How can I be more active in my day-to-day activities? Use stairs instead of an elevator. Take a walk during your lunch break. If you drive, park your car farther away from your work or school. If you take public transportation, get off one stop early and walk the rest of the way. Stand up or walk around during all of your indoor phone calls. Get up, stretch, and walk around every 30 minutes throughout the day. Enjoy exercise with a friend. Support to continue exercising will help you keep a regular routine of activity. Where to find more information You can find more information about exercising to stay healthy from: U.S. Department of Health and Human Services: ThisPath.fi Centers for Disease Control and Prevention (  CDC): FootballExhibition.com.br Summary Exercising regularly is  important. It will improve your overall fitness, flexibility, and endurance. Regular exercise will also improve your overall health. It can help you control your weight, reduce stress, and improve your bone density. Do not exercise so much that you hurt yourself, feel dizzy, or get very short of breath. Before you start a new exercise program, talk with your health care provider. This information is not intended to replace advice given to you by your health care provider. Make sure you discuss any questions you have with your health care provider. Document Revised: 05/19/2020 Document Reviewed: 05/19/2020 Elsevier Patient Education  2024 [employer].

## 2023-03-26 LAB — CMP14+EGFR
ALT: 14 [IU]/L (ref 0–32)
AST: 17 [IU]/L (ref 0–40)
Albumin: 4.1 g/dL (ref 3.9–4.9)
Alkaline Phosphatase: 76 [IU]/L (ref 44–121)
BUN/Creatinine Ratio: 13 (ref 9–23)
BUN: 11 mg/dL (ref 6–24)
Bilirubin Total: 0.4 mg/dL (ref 0.0–1.2)
CO2: 21 mmol/L (ref 20–29)
Calcium: 9.6 mg/dL (ref 8.7–10.2)
Chloride: 103 mmol/L (ref 96–106)
Creatinine, Ser: 0.87 mg/dL (ref 0.57–1.00)
Globulin, Total: 3.5 g/dL (ref 1.5–4.5)
Glucose: 93 mg/dL (ref 70–99)
Potassium: 4.1 mmol/L (ref 3.5–5.2)
Sodium: 139 mmol/L (ref 134–144)
Total Protein: 7.6 g/dL (ref 6.0–8.5)
eGFR: 85 mL/min/{1.73_m2} (ref >59)

## 2023-03-26 LAB — LIPID PANEL
Chol/HDL Ratio: 2.2 {ratio} (ref 0.0–4.4)
Cholesterol, Total: 196 mg/dL (ref 100–199)
HDL: 90 mg/dL (ref >39)
LDL Chol Calc (NIH): 84 mg/dL (ref 0–99)
Triglycerides: 132 mg/dL (ref 0–149)
VLDL Cholesterol Cal: 22 mg/dL (ref 5–40)

## 2023-03-26 LAB — HEMOGLOBIN A1C
Est. average glucose Bld gHb Est-mCnc: 100 mg/dL
Hgb A1c MFr Bld: 5.1 % (ref 4.8–5.6)

## 2023-03-27 ENCOUNTER — Telehealth: Payer: Self-pay

## 2023-03-27 NOTE — Telephone Encounter (Signed)
Returned my call, explained PREP, she would like to attend, prefers afternoons, next class at [facility] is 3/10, every M/W at 2:30; will contact end of February to schedule assessment visit.

## 2023-03-27 NOTE — Telephone Encounter (Signed)
 Called  re: PREP program referral, left voicemail requesting return call.

## 2023-04-02 ENCOUNTER — Telehealth: Payer: Self-pay

## 2023-04-02 NOTE — Telephone Encounter (Signed)
 Called to confirm participation in next PREP Class at [facility] on March 10; assessment visit scheduled for Tuesday March 4 at 12 noon.

## 2023-04-07 ENCOUNTER — Other Ambulatory Visit: Payer: Self-pay | Admitting: Family Medicine

## 2023-04-07 MED ORDER — FENTANYL 25 MCG/HR TD PT72: 1.0000 | MEDICATED_PATCH | TRANSDERMAL | 0 refills | Status: DC

## 2023-04-07 NOTE — Telephone Encounter (Signed)
 Pt called stating that she completely forgot that she had to change her patch today and she had not called it in. Pt would like a refill request for her fentaNYL (DURAGESIC) 25 MCG[facility]HR [facility] [address] Today if possible. Please call pt when request has been sent in.

## 2023-04-07 NOTE — Telephone Encounter (Signed)
 Last seen 10/16/22 and next f/u 05/12/23. Last refilled 03/06/23 #10.

## 2023-04-08 NOTE — Progress Notes (Signed)
 [facility] PREP Evaluation  Patient Details  Name: DEBROAH SHUTTLEWORTH MRN: [id number] Date of Birth: February 03, 1981 Age: 43 y.o. PCP: Dorothyann Peng, MD  Vitals:   04/08/23 1231  BP: (!) 142/94  Pulse: 94  SpO2: 99%  Weight: 277 lb 6.4 oz (125.8 kg)     [facility] Eval - 04/08/23 1200       [facility] "PREP" Location   [facility] "PREP" Location [facility]      Referral    Referring Provider Sanders    Reason for referral Inactivity;Obesitity/Overweight    Program Start Date 04/14/23      Measurement   Waist Circumference 44 inches    Hip Circumference 55.5 inches    Body fat 48.4 percent      Information for Trainer   Goals --   Healthy eating habits, increase stamina/energy; lose 10-15 pounds by end of program.   Current Exercise none    Orthopedic Concerns --   Back pain, OA hips   Pertinent Medical History --   MS   Current Barriers "Me"    Medications that affect exercise Other      Timed Up and Go (TUGS)   Timed Up and Go Low risk <9 seconds      Mobility and Daily Activities   I find it easy to walk up or down two or more flights of stairs. 2    I have no trouble taking out the trash. 2    I do housework such as vacuuming and dusting on my own without difficulty. 4    I can easily lift a gallon of milk (8lbs). 4    I can easily walk a mile. 1    I have no trouble reaching into high cupboards or reaching down to pick up something from the floor. 2    I do not have trouble doing out-door work such as Loss adjuster, chartered, raking leaves, or gardening. 1      Mobility and Daily Activities   I feel younger than my age. 1    I feel independent. 4    I feel energetic. 1    I live an active life.  3    I feel strong. 3    I feel healthy. 1    I feel active as other people my age. 1      How fit and strong are you.   Fit and Strong Total Score 30            Past Medical History:  Diagnosis Date   Bladder spasms    Colonic inertia    GERD (gastroesophageal reflux disease)     IBS (irritable bowel syndrome)    Insomnia    Multiple sclerosis (HCC)    Neuropathy    Vision abnormalities    Past Surgical History:  Procedure Laterality Date   BREAST SURGERY Right 2005   CYSTECTOMY     polyp removal     UPPER GASTROINTESTINAL ENDOSCOPY     Social History   Tobacco Use  Smoking Status Former   Types: E-cigarettes   Start date: 02/05/2020   Passive exposure: Never  Smokeless Tobacco Never  Tobacco Comments   Vapes some but not on a regular basis. 06/18/21. Hsm.   To begin PREP class at [facility] on 06/14/23, every M/W at 2:30  Sonia Baller 04/08/2023, 12:33 PM

## 2023-04-10 ENCOUNTER — Encounter: Payer: Self-pay | Admitting: Pulmonary Disease

## 2023-04-11 ENCOUNTER — Other Ambulatory Visit: Payer: Self-pay | Admitting: Neurology

## 2023-04-14 ENCOUNTER — Other Ambulatory Visit: Payer: Self-pay | Admitting: Family Medicine

## 2023-04-14 NOTE — Progress Notes (Signed)
 [facility] PREP Weekly Session  Patient Details  Name: Joy Cobb MRN: [id number] Date of Birth: 1980/07/06 Age: 43 y.o. PCP: Dorothyann Peng, MD  There were no vitals filed for this visit.   [facility] Weekly seesion - 04/14/23 1600       [facility] "PREP" Location   [facility] "PREP" Location [facility]      Weekly Session   Topic Discussed Goal setting and welcome to the program   Introductions, review of notebook, tour of facility; offered option for short work out on cardio machine   Classes attended to date 1             Ruthe Roemer B Bertine Schlottman 04/14/2023, 4:15 PM

## 2023-04-14 NOTE — Telephone Encounter (Signed)
 Pt is requesting a refill for  pregabalin (LYRICA) 150 MG capsule.  [id number]

## 2023-04-15 MED ORDER — PREGABALIN 150 MG PO CAPS: ORAL_CAPSULE | ORAL | 1 refills | Status: DC

## 2023-04-21 NOTE — Progress Notes (Signed)
 [facility] PREP Weekly Session  Patient Details  Name: Joy Cobb MRN: [id number] Date of Birth: 1980-09-10 Age: 43 y.o. PCP: Dorothyann Peng, MD  Vitals:   04/21/23 1542  Weight: 275 lb (124.7 kg)     [facility] Weekly seesion - 04/21/23 1500       [facility] "PREP" Location   [facility] "PREP" Location [facility]      Weekly Session   Topic Discussed Importance of resistance training;Other ways to be active   Goal: work up to 150 mins of cardio/wk by end of program and strenth training twice/wk (work up to 3 times a week) for 20-40 minutes each session by the end of the program   Classes attended to date 3             Keane Martelli B Alonnah Lampkins 04/21/2023, 3:43 PM

## 2023-05-03 ENCOUNTER — Other Ambulatory Visit: Payer: Self-pay | Admitting: Internal Medicine

## 2023-05-05 NOTE — Progress Notes (Signed)
 [facility] PREP Weekly Session  Patient Details  Name: Joy Cobb MRN: [id number] Date of Birth: [birth year] Age: 43 y.o. PCP: Dorothyann Peng, MD  Vitals:   05/05/23 1556  Weight: 275 lb (124.7 kg)     [facility] Weekly seesion - 05/05/23 1500       [facility] "PREP" Location   [facility] "PREP" Location [facility]      Weekly Session   Topic Discussed Health habits   Sugar demo; limit added sugars to 24 gm/day   Minutes exercised this week 40 minutes    Classes attended to date 6             Joy Cobb 05/05/2023, 4:00 PM

## 2023-05-07 ENCOUNTER — Other Ambulatory Visit: Payer: Self-pay | Admitting: Neurology

## 2023-05-07 MED ORDER — FENTANYL 25 MCG/HR TD PT72: 1.0000 | MEDICATED_PATCH | TRANSDERMAL | 0 refills | Status: DC

## 2023-05-07 NOTE — Telephone Encounter (Signed)
 Last seen 10/16/22 and next f/u 05/12/23. Last refilled 04/07/23 #10.

## 2023-05-07 NOTE — Telephone Encounter (Signed)
 Pt is needing a refill on her fentaNYL (DURAGESIC) 25 MCG/HR [facility] [address]

## 2023-05-08 NOTE — Patient Instructions (Signed)
Below is our plan:  We will continue current treatment plan.   Please make sure you are staying well hydrated. I recommend 50-60 ounces daily. Well balanced diet and regular exercise encouraged. Consistent sleep schedule with 6-8 hours recommended.   Please continue follow up with care team as directed.   Follow up with Dr Sater in 6 months   You may receive a survey regarding today's visit. I encourage you to leave honest feed back as I do use this information to improve patient care. Thank you for seeing me today!    

## 2023-05-08 NOTE — Progress Notes (Signed)
 Chief Complaint  Patient presents with   Follow-up    Pt in 2 alone Pt here for MS f/u Pt states no questions or concerns for today's visit     HISTORY OF PRESENT ILLNESS:  05/12/23 ALL:  Joy Cobb is a 43 y.o. female here today for follow up for  RRMS. She continues Tecfidera and tolerating well. Labs have been stable, reviewed in [facility] 09/2022. MRI brain stable 11/2021.   She reports doing well. Gait is unchanged. She continues to have bilateral foot pain R>L. Followed by podiatry for plantar fascitis. She continues meloxicam 15mg  as needed.    She continues Baclofen 10mg  TID for spasticity and is tolerating it well.   She feels chronic pain is fairly well managed. She continues Fentanyl patch every 72 hours and Lyrica 150mg  at bedtime. Last filled 05/07/2023 30 days and 04/15/2023 for 90 days, respectively. PDMP appropriate.   She stays tired. She is participating in a PREP program through PCP. She meets with a RN at the [facility] and participates in exercise and nutrition classes. Phentermine was not effective. We tried modafinil but she was scared to take due to pharmacist reporting it would make OCP less effective. She is taking OCP for management of PMDD. She is sleeping very well on trazodone 100mg  (PCP). She may take 50mg  on days she knows she has to wake up early. Normally sleeps about 9-10 hours. No restless sleep recently. Mood is stable on Prozac. She is not currently exercising.   Vitamin D def. Not currently on supplements.    HISTORY (copied from Dr Bonnita Hollow previous note)  Joy Cobb is a 43 yo woman with MS diagnosed in 2012.      Update 04/11/2022    She is on Tecfidera a and tolerates it well.  She has no recent exacerbations.     She feels her MS is mostly stable.     Gait is doing ok - seems lightly better than last year.  .  She feels she could walk a 1/4 mile without stopping on a good day. Balance is mildly off and she usually holds the bannister on  stairs.      She notes numbness and painful tingling in the hands and feet.   Initially her left side seemed weaker and more spastic though over the past couple years, the right side seems worse.    She takes baclofen 1 or two a day.    Her vision is doing well - she has a new prescription. .    Bladder is doing the same with some urgency/frequency and 2-3 times nocturua.  She falls back asleep easily.   No hesitancy.      She notes fatigue.  She never took provigil as it would have interfered with OCP.   She takes some OTC caffeine pill. .  Her Insomnia is better on trazodone.    She has had some REM behavior issues ---  mostly talking but once tried to punch bed partner.  She may have done little bit better when trazodone was started.  Because he is on chronic pain medicines, I will avoid benzo at night.   She has chronic hip pain due to avascular necrosis in the past (had been on multiple doses of IV Solu-Medrol early in the course of her MS) but she also has dysesthesias.  The PDMP was reviewed.  She is only getting controlled substances from our office.  She is not escalating doses.  No drug seeking behavior.     Tox screen 11/22/2020 showed fentanyl (prescribed ) but also THC. We discussed we will need to keep the fenatanyl dose low.     04/05/2020 lymphocyte 1.8   MS History:   She was diagnosed with multiple sclerosis in April 2012 after her second episode of optic neuritis. Her previous episode was about 7 or 8 months earlier and an MRI of the brain at that time did not show changes consistent with MS. After her second episode she had another MRI which showed some changes in the brain consistent with MS. She received IV steroids for the second optic neuritis.  First episode was left and second episode was right.   A lumbar puncture showed the CSF was consistent with Multiple sclerosis.   She was initially placed on Betaseron. However, she continued to have exacerbations. Additionally, she had  difficulty tolerating it well. In 2012, she had 3 other exacerbations while on Betaseron. At least some of them involved her spine and she had evidence on MRI and bilateral changes in her legs on exam. Due to the aggressiveness of the MS, she received 8 courses of IV Cytoxan while she was living in [place].  She then was switched to Tecfidera in 2013 and remains on that medication.  She moved to [facility] in 2013 and started to see Dr. Leotis Shames. When he moved further away, she transferred her care to me. In 2014, she had some worsening gait that was felt to be due to an exacerbation that she receive a course of steroids. She has not had any further exacerbations while on Tecfidera.  Her last MRI was early 2015 at [facility].   IMAGING MRI brain 11/09/2021 was unchanged compared 08/21/2019   MRI brain 08/21/2019 showed Scattered T2/FLAIR hyperintense foci in the hemispheres.  The pattern is consistent with chronic demyelinating plaque associated with multiple sclerosis.  None of the foci appear to be acute.  They do not enhance.  Compared to the MRI dated 09/08/2017, there are no new lesions.    There are no acute findings.     After contrast, there is noted to be a very small developmental venous anomaly in the right frontal lobe, less apparent on the current scan than on the previous scan.   MRI cervical spine 03/23/2015 showed minimal disc bulges at C3-C4 and C6-C7 that did not lead to any nerve root impingement. These are unchanged when compared to the MRI dated 11/17/2011.   The spinal cord appears normal..   There is a normal enhancement pattern.    REVIEW OF SYSTEMS: Out of a complete 14 system review of symptoms, the patient complains only of the following symptoms, chronic pain, fatigue, and all other reviewed systems are negative.    ALLERGIES: Allergies  Allergen Reactions   Penicillins Hives   Temazepam Nausea Only    Sharp pains in stomach   Amoxicillin Rash   Septra  [Sulfamethoxazole-Trimethoprim] Rash     HOME MEDICATIONS: Outpatient Medications Prior to Visit  Medication Sig Dispense Refill   albuterol (VENTOLIN HFA) 108 (90 Base) MCG/ACT inhaler Inhale 2 puffs into the lungs every 6 (six) hours as needed for wheezing or shortness of breath. 18 g 0   baclofen (LIORESAL) 10 MG tablet TAKE 1 TABLET BY MOUTH THREE TIMES A DAY 270 tablet 3   cetirizine (ZYRTEC ALLERGY) 10 MG tablet Take 1 tablet (10 mg total) by mouth daily. 30 tablet 0   fentaNYL (DURAGESIC) 25 MCG/HR Place  1 patch onto the skin every 3 (three) days. 10 patch 0   FLUoxetine (PROZAC) 40 MG capsule TAKE 1 CAPSULE BY MOUTH EVERY DAY 90 capsule 1   hydrOXYzine (ATARAX) 25 MG tablet TAKE 1 TABLET BY MOUTH EVERYDAY AT BEDTIME 90 tablet 2   linaclotide (LINZESS) 290 MCG CAPS capsule Take 1 capsule (290 mcg total) by mouth daily as needed. 30 capsule 3   meloxicam (MOBIC) 15 MG tablet TAKE 1 TABLET (15 MG TOTAL) BY MOUTH DAILY AS NEEDED (FOR HIP PAIN FLARE UPS). 30 tablet 2   metoCLOPramide (REGLAN) 5 MG tablet TAKE 2 TABLETS BY MOUTH TWICE A DAY 120 tablet 3   NIKKI 3-0.02 MG tablet TAKE 1 TABLET BY MOUTH EVERY DAY 84 tablet 2   nystatin cream (MYCOSTATIN) Apply topically 2 (two) times daily. 30 g 1   pantoprazole (PROTONIX) 40 MG tablet TAKE 1 TABLET BY MOUTH EVERY DAY 90 tablet 1   pregabalin (LYRICA) 150 MG capsule TAKE ONE CAPSULE BY MOUTH AT BEDTIME 90 capsule 1   sucralfate (CARAFATE) 1 GM/10ML suspension TAKE 10 MLS (1 G TOTAL) BY MOUTH 2 (TWO) TIMES DAILY AS NEEDED 1800 mL 1   TECFIDERA 240 MG CPDR TAKE 1 CAPSULE (240MG ) BY MOUTH  TWICE DAILY 180 capsule 5   traZODone (DESYREL) 100 MG tablet TAKE 1 TABLET BY MOUTH EVERYDAY AT BEDTIME 90 tablet 1   triamcinolone cream (KENALOG) 0.1 % APPLY TO AFFECTED AREA TWICE DAILY AS NEEDED 45 g 0   triamcinolone cream (KENALOG) 0.5 % Apply 1 Application topically daily as needed.     No facility-administered medications prior to visit.     PAST  MEDICAL HISTORY: Past Medical History:  Diagnosis Date   Bladder spasms    Colonic inertia    GERD (gastroesophageal reflux disease)    IBS (irritable bowel syndrome)    Insomnia    Multiple sclerosis (HCC)    Neuropathy    Vision abnormalities      PAST SURGICAL HISTORY: Past Surgical History:  Procedure Laterality Date   BREAST SURGERY Right 2005   CYSTECTOMY     polyp removal     UPPER GASTROINTESTINAL ENDOSCOPY       FAMILY HISTORY: Family History  Problem Relation Age of Onset   Neuropathy Mother    Allergic rhinitis Mother    Urticaria Mother    GER disease Mother    Cancer - Other Father    Thyroid disease Maternal Aunt    Breast cancer Maternal Aunt    Parkinson's disease Maternal Aunt    Diabetes Maternal Grandmother    Hypertension Maternal Grandmother    Kidney failure Maternal Grandmother    Parkinson's disease Maternal Grandmother    Kidney disease Maternal Grandfather    Hyperlipidemia Paternal Grandmother    Hyperlipidemia Paternal Grandfather    Colon cancer Other    Asthma Neg Hx    Eczema Neg Hx    Esophageal cancer Neg Hx    Rectal cancer Neg Hx    Stomach cancer Neg Hx    Multiple sclerosis Neg Hx      SOCIAL HISTORY: Social History   Socioeconomic History   Marital status: Single    Spouse name: Not on file   Number of children: Not on file   Years of education: Not on file   Highest education level: Associate degree: occupational, Scientist, product/process development, or vocational program  Occupational History   Not on file  Tobacco Use   Smoking status: Former  Types: E-cigarettes    Start date: 02/05/2020    Passive exposure: Never   Smokeless tobacco: Never   Tobacco comments:    Vapes some but not on a regular basis. 06/18/21. Hsm.   Vaping Use   Vaping status: Former   Quit date: 07/04/2022  Substance and Sexual Activity   Alcohol use: Yes    Alcohol/week: 2.0 standard drinks of alcohol    Types: 1 Glasses of wine, 1 Cans of beer per week     Comment: socially   Drug use: No   Sexual activity: Not Currently    Birth control/protection: Pill  Other Topics Concern   Not on file  Social History Narrative   Pt not working    Pt lives alone    Social Drivers of Health   Financial Resource Strain: High Risk (03/24/2023)   Overall Financial Resource Strain (CARDIA)    Difficulty of Paying Living Expenses: Hard  Food Insecurity: No Food Insecurity (03/24/2023)   Hunger Vital Sign    Worried About Running Out of Food in the Last Year: Never true    Ran Out of Food in the Last Year: Never true  Transportation Needs: No Transportation Needs (03/24/2023)   PRAPARE - Administrator, Civil Service (Medical): No    Lack of Transportation (Non-Medical): No  Physical Activity: Insufficiently Active (03/24/2023)   Exercise Vital Sign    Days of Exercise per Week: 2 days    Minutes of Exercise per Session: 10 min  Stress: No Stress Concern Present (03/24/2023)   [employer] of Occupational Health - Occupational Stress Questionnaire    Feeling of Stress : Not at all  Social Connections: Moderately Isolated (03/24/2023)   Social Connection and Isolation Panel [NHANES]    Frequency of Communication with Friends and Family: More than three times a week    Frequency of Social Gatherings with Friends and Family: Twice a week    Attends Religious Services: More than 4 times per year    Active Member of [employer] or Organizations: No    Attends Engineer, structural: Not on file    Marital Status: Never married  Intimate Partner Violence: Not on file      PHYSICAL EXAM  Vitals:   05/12/23 1314  BP: 115/74  Pulse: 74  Weight: 282 lb (127.9 kg)  Height: 5\' 5"  (1.651 m)      Body mass index is 46.93 kg/m.   Generalized: Well developed, in no acute distress  Cardiology: normal rate and rhythm, no murmur auscultated  Respiratory: clear to auscultation bilaterally    Neurological examination  Mentation:  Alert oriented to time, place, history taking. Follows all commands speech and language fluent Cranial nerve II-XII: Pupils were equal round reactive to light. Extraocular movements were full, visual field were full on confrontational test. Facial sensation and strength were normal. Head turning and shoulder shrug  were normal and symmetric. Motor: The motor testing reveals 5 over 5 strength of all 4 extremities. Good symmetric motor tone is noted throughout.    Sensory: Sensory testing is intact to soft touch on all 4 extremities. No evidence of extinction is noted. Minor decreased sensation to the LLE Coordination: Cerebellar testing reveals good finger-nose-finger and heel-to-shin bilaterally.  Gait and station: Station normal. Gait with left limp due to left knee pain. Romberg is negative. No drift is seen.  Reflexes: Deep tendon reflexes are symmetric and normal bilaterally.    DIAGNOSTIC DATA (LABS, IMAGING, TESTING) -  I reviewed patient records, labs, notes, testing and imaging myself where available.  Lab Results  Component Value Date   WBC 5.7 09/19/2022   HGB 11.8 09/19/2022   HCT 34.4 09/19/2022   MCV 85 09/19/2022   PLT 244 09/19/2022      Component Value Date/Time   NA 139 03/25/2023 1223   K 4.1 03/25/2023 1223   CL 103 03/25/2023 1223   CO2 21 03/25/2023 1223   GLUCOSE 93 03/25/2023 1223   GLUCOSE 98 02/14/2012 2345   BUN 11 03/25/2023 1223   CREATININE 0.87 03/25/2023 1223   CALCIUM 9.6 03/25/2023 1223   PROT 7.6 03/25/2023 1223   ALBUMIN 4.1 03/25/2023 1223   AST 17 03/25/2023 1223   ALT 14 03/25/2023 1223   ALKPHOS 76 03/25/2023 1223   BILITOT 0.4 03/25/2023 1223   GFRNONAA >90 02/14/2012 2345   GFRAA >90 02/14/2012 2345   Lab Results  Component Value Date   CHOL 196 03/25/2023   HDL 90 03/25/2023   LDLCALC 84 03/25/2023   TRIG 132 03/25/2023   CHOLHDL 2.2 03/25/2023   Lab Results  Component Value Date   HGBA1C 5.1 03/25/2023   Lab Results   Component Value Date   VITAMINB12 632 03/13/2022   Lab Results  Component Value Date   TSH 1.150 03/13/2022        No data to display               No data to display           ASSESSMENT AND PLAN  43 y.o. year old female  has a past medical history of Bladder spasms, Colonic inertia, GERD (gastroesophageal reflux disease), IBS (irritable bowel syndrome), Insomnia, Multiple sclerosis (HCC), Neuropathy, and Vision abnormalities. here with    Multiple sclerosis (HCC) - Plan: CBC with Differential/Platelets, Vitamin D, 25-hydroxy  High risk medication use  Chronic pain of both hips  Chronic fatigue  Vitamin D deficiency  Chronic prescription opiate use  Chronic pain syndrome  Joy Cobb is doing well today. MS is stable on Tecfidera. We will continue current treatment plan. She will continue Fentanyl patches and pregabalin as prescribed. PDMP shows appropriate refills. I commended her for participating in the PREP program. She will continue to work on healthy lifestyle habits. She will follow up in 6 months with Dr Epimenio Foot. She verbalizes understanding and agreement with this plan.    Orders Placed This Encounter  Procedures   CBC with Differential/Platelets   Vitamin D, 25-hydroxy     No orders of the defined types were placed in this encounter.     Shawnie Dapper, MSN, FNP-C 05/12/2023, 2:31 PM  [facility] [address], [address] [place], [place] [address] [phone]

## 2023-05-12 ENCOUNTER — Encounter: Payer: Self-pay | Admitting: Family Medicine

## 2023-05-12 ENCOUNTER — Ambulatory Visit (INDEPENDENT_AMBULATORY_CARE_PROVIDER_SITE_OTHER): Payer: 59 | Admitting: Family Medicine

## 2023-05-12 VITALS — BP 115/74 | HR 74 | Ht 65.0 in | Wt 282.0 lb

## 2023-05-12 DIAGNOSIS — M25552 Pain in left hip: Secondary | ICD-10-CM | POA: Diagnosis not present

## 2023-05-12 DIAGNOSIS — E559 Vitamin D deficiency, unspecified: Secondary | ICD-10-CM | POA: Diagnosis not present

## 2023-05-12 DIAGNOSIS — Z79899 Other long term (current) drug therapy: Secondary | ICD-10-CM

## 2023-05-12 DIAGNOSIS — M25551 Pain in right hip: Secondary | ICD-10-CM | POA: Diagnosis not present

## 2023-05-12 DIAGNOSIS — R799 Abnormal finding of blood chemistry, unspecified: Secondary | ICD-10-CM | POA: Diagnosis not present

## 2023-05-12 DIAGNOSIS — Z5181 Encounter for therapeutic drug level monitoring: Secondary | ICD-10-CM | POA: Diagnosis not present

## 2023-05-12 DIAGNOSIS — Z79891 Long term (current) use of opiate analgesic: Secondary | ICD-10-CM | POA: Diagnosis not present

## 2023-05-12 DIAGNOSIS — G8929 Other chronic pain: Secondary | ICD-10-CM

## 2023-05-12 DIAGNOSIS — G35 Multiple sclerosis: Secondary | ICD-10-CM

## 2023-05-12 DIAGNOSIS — R5382 Chronic fatigue, unspecified: Secondary | ICD-10-CM | POA: Diagnosis not present

## 2023-05-12 DIAGNOSIS — G35D Multiple sclerosis, unspecified: Secondary | ICD-10-CM

## 2023-05-12 DIAGNOSIS — G894 Chronic pain syndrome: Secondary | ICD-10-CM

## 2023-05-12 NOTE — Progress Notes (Signed)
 [employer] PREP Weekly Session  Patient Details  Name: Joy Cobb MRN: [id number] Date of Birth: 30-Jul-1980 Age: 43 y.o. PCP: Dorothyann Peng, MD  Vitals:   05/12/23 1606  Weight: 278 lb (126.1 kg)     [facility] Weekly seesion - 05/12/23 1600       [facility] "PREP" Location   [facility] "PREP" Location [facility]      Weekly Session   Topic Discussed Restaurant Eating   limit sodium intake to 1500-2400 mg/day; salt demo   Minutes exercised this week 90 minutes    Classes attended to date 8             Joy Cobb 05/12/2023, 4:06 PM

## 2023-05-13 ENCOUNTER — Encounter: Payer: Self-pay | Admitting: Family Medicine

## 2023-05-13 LAB — CBC WITH DIFFERENTIAL/PLATELET
Basophils Absolute: 0 10*3/uL (ref 0.0–0.2)
Basos: 0 %
EOS (ABSOLUTE): 0.1 10*3/uL (ref 0.0–0.4)
Eos: 1 %
Hematocrit: 34.1 % (ref 34.0–46.6)
Hemoglobin: 11.8 g/dL (ref 11.1–15.9)
Immature Grans (Abs): 0 10*3/uL (ref 0.0–0.1)
Immature Granulocytes: 0 %
Lymphocytes Absolute: 2.4 10*3/uL (ref 0.7–3.1)
Lymphs: 40 %
MCH: 29.6 pg (ref 26.6–33.0)
MCHC: 34.6 g/dL (ref 31.5–35.7)
MCV: 86 fL (ref 79–97)
Monocytes Absolute: 0.5 10*3/uL (ref 0.1–0.9)
Monocytes: 8 %
Neutrophils Absolute: 3 10*3/uL (ref 1.4–7.0)
Neutrophils: 51 %
Platelets: 213 10*3/uL (ref 150–450)
RBC: 3.99 x10E6/uL (ref 3.77–5.28)
RDW: 13 % (ref 11.7–15.4)
WBC: 5.9 10*3/uL (ref 3.4–10.8)

## 2023-05-13 LAB — VITAMIN D 25 HYDROXY (VIT D DEFICIENCY, FRACTURES): Vit D, 25-Hydroxy: 22.9 ng/mL — ABNORMAL LOW (ref 30.0–100.0)

## 2023-05-16 ENCOUNTER — Other Ambulatory Visit: Payer: Self-pay | Admitting: Internal Medicine

## 2023-05-19 NOTE — Progress Notes (Signed)
 [employer] PREP Weekly Session  Patient Details  Name: Joy Cobb MRN: [id number] Date of Birth: 11/29/1980 Age: 43 y.o. PCP: Cleave Curling, MD  Vitals:   05/19/23 1542  Weight: 279 lb (126.6 kg)     [facility] Weekly seesion - 05/19/23 1500       [facility] "PREP" Location   [facility] "PREP" Location [facility]      Weekly Session   Topic Discussed Stress management and problem solving   Importance of 7-9 hrs/night of sleep; finger tip mudra breathwork, baby to bed concept   Minutes exercised this week 60 minutes    Classes attended to date 57             Joy Cobb 05/19/2023, 3:43 PM

## 2023-05-30 ENCOUNTER — Encounter

## 2023-05-30 ENCOUNTER — Ambulatory Visit

## 2023-05-30 DIAGNOSIS — Z0000 Encounter for general adult medical examination without abnormal findings: Secondary | ICD-10-CM | POA: Diagnosis not present

## 2023-05-30 NOTE — Progress Notes (Signed)
 Subjective:   Joy Cobb is a 43 y.o. female who presents for Medicare Annual (Subsequent) preventive examination.  Visit Complete: Virtual I connected with  Barbee Bookman on 05/30/23 by a audio enabled telemedicine application and verified that I am speaking with the correct person using two identifiers.  Patient Location: Home  Provider Location: Office/Clinic  I discussed the limitations of evaluation and management by telemedicine. The patient expressed understanding and agreed to proceed.  Vital Signs: Because this visit was a virtual/telehealth visit, some criteria may be missing or patient reported. Any vitals not documented were not able to be obtained and vitals that have been documented are patient reported.  Patient Medicare AWV questionnaire was completed by the patient on 05/30/2023; I have confirmed that all information answered by patient is correct and no changes since this date.        Objective:    There were no vitals filed for this visit. There is no height or weight on file to calculate BMI.     05/08/2022    2:32 PM 02/15/2012    4:25 AM  Advanced Directives  Does Patient Have a Medical Advance Directive? No Patient would like information  Would patient like information on creating a medical advance directive?  Referral made to social work;Advance directive packet given    Current Medications (verified) Outpatient Encounter Medications as of 05/30/2023  Medication Sig   albuterol  (VENTOLIN  HFA) 108 (90 Base) MCG/ACT inhaler Inhale 2 puffs into the lungs every 6 (six) hours as needed for wheezing or shortness of breath.   baclofen  (LIORESAL ) 10 MG tablet TAKE 1 TABLET BY MOUTH THREE TIMES A DAY   cetirizine  (ZYRTEC  ALLERGY) 10 MG tablet Take 1 tablet (10 mg total) by mouth daily.   fentaNYL  (DURAGESIC ) 25 MCG/HR Place 1 patch onto the skin every 3 (three) days.   FLUoxetine  (PROZAC ) 40 MG capsule TAKE 1 CAPSULE BY MOUTH EVERY DAY   hydrOXYzine   (ATARAX ) 25 MG tablet TAKE 1 TABLET BY MOUTH EVERYDAY AT BEDTIME   linaclotide  (LINZESS ) 290 MCG CAPS capsule Take 1 capsule (290 mcg total) by mouth daily as needed.   meloxicam  (MOBIC ) 15 MG tablet TAKE 1 TABLET (15 MG TOTAL) BY MOUTH DAILY AS NEEDED (FOR HIP PAIN FLARE UPS).   metoCLOPramide  (REGLAN ) 5 MG tablet TAKE 2 TABLETS BY MOUTH TWICE A DAY   NIKKI  3-0.02 MG tablet TAKE 1 TABLET BY MOUTH EVERY DAY   nystatin  cream (MYCOSTATIN ) Apply topically 2 (two) times daily.   pantoprazole  (PROTONIX ) 40 MG tablet TAKE 1 TABLET BY MOUTH EVERY DAY   pregabalin  (LYRICA ) 150 MG capsule TAKE ONE CAPSULE BY MOUTH AT BEDTIME   sucralfate  (CARAFATE ) 1 GM/10ML suspension TAKE 10 MLS (1 G TOTAL) BY MOUTH 2 (TWO) TIMES DAILY AS NEEDED   TECFIDERA  240 MG CPDR TAKE 1 CAPSULE (240MG ) BY MOUTH  TWICE DAILY   traZODone  (DESYREL ) 100 MG tablet TAKE 1 TABLET BY MOUTH EVERYDAY AT BEDTIME   triamcinolone  cream (KENALOG ) 0.1 % APPLY TO AFFECTED AREA TWICE DAILY AS NEEDED   triamcinolone  cream (KENALOG ) 0.5 % Apply 1 Application topically daily as needed.   No facility-administered encounter medications on file as of 05/30/2023.    Allergies (verified) Penicillins, Temazepam, Amoxicillin, and Septra [sulfamethoxazole-trimethoprim]   History: Past Medical History:  Diagnosis Date   Bladder spasms    Colonic inertia    GERD (gastroesophageal reflux disease)    IBS (irritable bowel syndrome)    Insomnia    Multiple  sclerosis (HCC)    Neuropathy    Vision abnormalities    Past Surgical History:  Procedure Laterality Date   BREAST SURGERY Right 2005   CYSTECTOMY     polyp removal     UPPER GASTROINTESTINAL ENDOSCOPY     Family History  Problem Relation Age of Onset   Neuropathy Mother    Allergic rhinitis Mother    Urticaria Mother    GER disease Mother    Cancer - Other Father    Thyroid  disease Maternal Aunt    Breast cancer Maternal Aunt    Parkinson's disease Maternal Aunt    Diabetes Maternal  Grandmother    Hypertension Maternal Grandmother    Kidney failure Maternal Grandmother    Parkinson's disease Maternal Grandmother    Kidney disease Maternal Grandfather    Hyperlipidemia Paternal Grandmother    Hyperlipidemia Paternal Grandfather    Colon cancer Other    Asthma Neg Hx    Eczema Neg Hx    Esophageal cancer Neg Hx    Rectal cancer Neg Hx    Stomach cancer Neg Hx    Multiple sclerosis Neg Hx    Social History   Socioeconomic History   Marital status: Single    Spouse name: Not on file   Number of children: Not on file   Years of education: Not on file   Highest education level: Associate degree: occupational, Scientist, product/process development, or vocational program  Occupational History   Not on file  Tobacco Use   Smoking status: Former    Types: E-cigarettes    Start date: 02/05/2020    Passive exposure: Never   Smokeless tobacco: Never   Tobacco comments:    Vapes some but not on a regular basis. 06/18/21. Hsm.   Vaping Use   Vaping status: Former   Quit date: 07/04/2022  Substance and Sexual Activity   Alcohol use: Yes    Alcohol/week: 2.0 standard drinks of alcohol    Types: 1 Glasses of wine, 1 Cans of beer per week    Comment: socially   Drug use: No   Sexual activity: Not Currently    Birth control/protection: Pill  Other Topics Concern   Not on file  Social History Narrative   Pt not working    Pt lives alone    Social Drivers of Health   Financial Resource Strain: High Risk (03/24/2023)   Overall Financial Resource Strain (CARDIA)    Difficulty of Paying Living Expenses: Hard  Food Insecurity: No Food Insecurity (03/24/2023)   Hunger Vital Sign    Worried About Running Out of Food in the Last Year: Never true    Ran Out of Food in the Last Year: Never true  Transportation Needs: No Transportation Needs (03/24/2023)   PRAPARE - Administrator, Civil Service (Medical): No    Lack of Transportation (Non-Medical): No  Physical Activity: Insufficiently  Active (03/24/2023)   Exercise Vital Sign    Days of Exercise per Week: 2 days    Minutes of Exercise per Session: 10 min  Stress: No Stress Concern Present (03/24/2023)   [employer] of Occupational Health - Occupational Stress Questionnaire    Feeling of Stress : Not at all  Social Connections: Moderately Isolated (03/24/2023)   Social Connection and Isolation Panel [NHANES]    Frequency of Communication with Friends and Family: More than three times a week    Frequency of Social Gatherings with Friends and Family: Twice a week  Attends Religious Services: More than 4 times per year    Active Member of Clubs or Organizations: No    Attends Banker Meetings: Not on file    Marital Status: Never married    Tobacco Counseling Counseling given: Not Answered Tobacco comments: Vapes some but not on a regular basis. 06/18/21. Hsm.    Clinical Intake:                        Activities of Daily Living     No data to display          Patient Care Team: Cleave Curling, MD as PCP - General (Internal Medicine)  Indicate any recent Medical Services you may have received from other than [facility] providers in the past year (date may be approximate).     Assessment:   This is a routine wellness examination for Lanyla.  Hearing/Vision screen No results found.   Goals Addressed   None    Depression Screen    03/25/2023   12:13 PM 09/19/2022   11:03 AM 05/08/2022    2:33 PM 03/13/2022    2:42 PM  PHQ 2/9 Scores  PHQ - 2 Score 2 2 0 0  PHQ- 9 Score  7      Fall Risk    09/19/2022   11:03 AM 05/08/2022    2:33 PM 03/13/2022    2:42 PM  Fall Risk   Falls in the past year? 1 1 1   Comment  tripped   Number falls in past yr: 0 0 0  Injury with Fall? 0 0 0  Risk for fall due to : History of fall(s) Medication side effect No Fall Risks  Follow up Falls evaluation completed Falls prevention discussed;Education provided;Falls evaluation completed Falls  evaluation completed    MEDICARE RISK AT HOME:    TIMED UP AND GO:  Was the test performed?  No    Cognitive Function:        05/08/2022    2:35 PM  6CIT Screen  What Year? 0 points  What month? 0 points  What time? 0 points  Count back from 20 0 points  Months in reverse 0 points  Repeat phrase 2 points  Total Score 2 points    Immunizations Immunization History  Administered Date(s) Administered   Influenza Inj Mdck Quad Pf 10/27/2018   Influenza, Mdck, Trivalent,PF 6+ MOS(egg free) 02/06/2023   Influenza,inj,Quad PF,6+ Mos 10/28/2016, 10/17/2017, 11/12/2021   PFIZER SARS-COV-2 Pediatric Vaccination 5-37yrs 05/31/2020   Pfizer Covid-19 Vaccine Bivalent Booster 36yrs & up 08/22/2019, 09/12/2019, 02/02/2021    TDAP status: Due, Education has been provided regarding the importance of this vaccine. Advised may receive this vaccine at local pharmacy or Health Dept. Aware to provide a copy of the vaccination record if obtained from local pharmacy or Health Dept. Verbalized acceptance and understanding.  Flu Vaccine status: Up to date  Pneumococcal vaccine status: Due, Education has been provided regarding the importance of this vaccine. Advised may receive this vaccine at local pharmacy or Health Dept. Aware to provide a copy of the vaccination record if obtained from local pharmacy or Health Dept. Verbalized acceptance and understanding.  Covid-19 vaccine status: Information provided on how to obtain vaccines.   Qualifies for Shingles Vaccine? No   Zostavax completed No   Shingrix Completed?: No.    Education has been provided regarding the importance of this vaccine. Patient has been advised to call insurance company to  determine out of pocket expense if they have not yet received this vaccine. Advised may also receive vaccine at local pharmacy or Health Dept. Verbalized acceptance and understanding.  Screening Tests Health Maintenance  Topic Date Due   Pneumococcal  Vaccine 66-27 Years old (1 of 2 - PCV) Never done   Cervical Cancer Screening (HPV/Pap Cotest)  Never done   COVID-19 Vaccine (4 - 2024-25 season) 10/06/2022   Medicare Annual Wellness (AWV)  05/08/2023   HIV Screening  09/19/2023 (Originally 07/[birth year]/1994)   INFLUENZA VACCINE  09/05/2023   Hepatitis C Screening  Completed   HPV VACCINES  Aged Out   Meningococcal B Vaccine  Aged Out   DTaP/Tdap/Td  Discontinued    Health Maintenance  Health Maintenance Due  Topic Date Due   Pneumococcal Vaccine 18-11 Years old (1 of 2 - PCV) Never done   Cervical Cancer Screening (HPV/Pap Cotest)  Never done   COVID-19 Vaccine (4 - 2024-25 season) 10/06/2022   Medicare Annual Wellness (AWV)  05/08/2023    Colorectal cancer screening: No longer required.   Mammogram status: No longer required due to  .    Lung Cancer Screening: (Low Dose CT Chest recommended if Age 68-80 years, 20 pack-year currently smoking OR have quit w/in 15years.) does not qualify.   Lung Cancer Screening Referral:   Additional Screening:  Hepatitis C Screening: does not qualify; Completed   Vision Screening: Recommended annual ophthalmology exams for early detection of glaucoma and other disorders of the eye. Is the patient up to date with their annual eye exam?  Yes  Who is the provider or what is the name of the office in which the patient attends annual eye exams? [facility] If pt is not established with a provider, would they like to be referred to a provider to establish care? No .   Dental Screening: Recommended annual dental exams for proper oral hygiene  Diabetic Foot Exam: not diabetic  Community Resource Referral / Chronic Care Management: CRR required this visit?  No   CCM required this visit?  No     Plan:     I have personally reviewed and noted the following in the patient's chart:   Medical and social history Use of alcohol, tobacco or illicit drugs  Current medications and supplements  including opioid prescriptions. Patient is not currently taking opioid prescriptions. Functional ability and status Nutritional status Physical activity Advanced directives List of other physicians Hospitalizations, surgeries, and ER visits in previous 12 months Vitals Screenings to include cognitive, depression, and falls Referrals and appointments  In addition, I have reviewed and discussed with patient certain preventive protocols, quality metrics, and best practice recommendations. A written personalized care plan for preventive services as well as general preventive health recommendations were provided to patient.     Alexis Anger, CMA   05/30/2023   After Visit Summary: (MyChart) Due to this being a telephonic visit, the after visit summary with patients personalized plan was offered to patient via MyChart   Nurse Notes:

## 2023-06-02 NOTE — Progress Notes (Signed)
 [facility] PRE[facility] Weekly Session  Patient Details  Name: Joy Cobb MRN: [id number] Date of Birth: 1980/10/25 Age: 43 y.o. PCP: Cleave Curling, MD  Vitals:   06/02/23 1543  Weight: 279 lb (126.6 kg)     [facility] Weekly seesion - 06/02/23 1500       [facility] "PREP" Location   [facility] "PREP" Location [facility]      Weekly Session   Topic Discussed Other   Portion Control; visualize your portion size demo; review of [employer] food label   Minutes exercised this week 60 minutes    Classes attended to date 67             Jim Motts 06/02/2023, 3:44 PM

## 2023-06-05 ENCOUNTER — Other Ambulatory Visit: Payer: Self-pay | Admitting: Family Medicine

## 2023-06-05 NOTE — Telephone Encounter (Signed)
 Pt request refill for fentaNYL (DURAGESIC) 25 MCG/HR [facility]  [phone]

## 2023-06-06 ENCOUNTER — Encounter

## 2023-06-06 MED ORDER — FENTANYL 25 MCG/HR TD PT72: 1.0000 | MEDICATED_PATCH | TRANSDERMAL | 0 refills | Status: DC

## 2023-06-06 NOTE — Telephone Encounter (Signed)
 Dr. Tresia Fruit- you are on call today. Can you e-scribe? Pt due and Dr. Godwin Lat not here. Thank you!  Last seen 05/12/23 and next f/u 12/17/23. Last refilled 05/07/23 #10.

## 2023-06-09 NOTE — Progress Notes (Signed)
 [facility] PRE[facility] Weekly Session  Patient Details  Name: Joy Cobb MRN: [id number] Date of Birth: July 26, 1980 Age: 43 y.o. PCP: Cleave Curling, MD  Vitals:   06/09/23 1543  Weight: 278 lb 12.8 oz (126.5 kg)     [facility] Weekly seesion - 06/09/23 1500       [facility] "PREP" Location   [facility] "PREP" Location [facility]      Weekly Session   Topic Discussed Calorie breakdown   [place] guildelines for Carbs, proteins and fats; difference between simple and complex carbs; trustworthy supplement organizations   Minutes exercised this week 120 minutes    Classes attended to date 17             Carlee Tesfaye B Casten Floren 06/09/2023, 3:46 PM

## 2023-06-12 ENCOUNTER — Other Ambulatory Visit: Payer: Self-pay | Admitting: Internal Medicine

## 2023-06-12 DIAGNOSIS — Z1231 Encounter for screening mammogram for malignant neoplasm of breast: Secondary | ICD-10-CM

## 2023-06-23 NOTE — Progress Notes (Signed)
 [facility] PREP Weekly Session  Patient Details  Name: Joy Cobb MRN: [id number] Date of Birth: 1980-04-18 Age: 43 y.o. PCP: Cleave Curling, MD  Vitals:   06/23/23 1537  Weight: 279 lb 3.2 oz (126.6 kg)     [facility] Weekly seesion - 06/23/23 1500       [facility] "PREP" Location   [facility] "PREP" Location [facility]      Weekly Session   Topic Discussed Finding support   Membership talk with Diego Foy; reivew of goals and acitivity plan for next 90 days   Minutes exercised this week 45 minutes    Classes attended to date 44             Makala Fetterolf B Neftaly Swiss 06/23/2023, 3:38 PM

## 2023-06-25 ENCOUNTER — Inpatient Hospital Stay: Admission: RE | Admit: 2023-06-25 | Source: Ambulatory Visit

## 2023-07-04 ENCOUNTER — Other Ambulatory Visit: Payer: Self-pay | Admitting: Family Medicine

## 2023-07-04 MED ORDER — FENTANYL 25 MCG/HR TD PT72: 1.0000 | MEDICATED_PATCH | TRANSDERMAL | 0 refills | Status: DC

## 2023-07-04 NOTE — Telephone Encounter (Signed)
Pt is requesting a refill for fentaNYL (DURAGESIC) 25 MCG/HR.  Pharmacy: [id number]  

## 2023-07-04 NOTE — Telephone Encounter (Signed)
 Amy, are you comfortable filling since Dr. Godwin Lat out? You last saw pt.   Last seen 05/12/23 and next f/u 12/17/23. Last refilled 06/06/23 #10.

## 2023-07-07 NOTE — Progress Notes (Signed)
 [facility] PRE[facility] Weekly Session  Patient Details  Name: Joy Cobb MRN: [id number] Date of Birth: Aug 26, 1980 Age: 43 y.o. PCP: Cleave Curling, MD  Vitals:   07/07/23 1520  Weight: 279 lb 3.2 oz (126.6 kg)     [facility] Weekly seesion - 07/07/23 1500       [facility] "PREP" Location   [facility] "PREP" Location [facility]      Weekly Session   Topic Discussed Other   How fit and strong test, 2 min march in place test, 30 seconds of bicep curls test, 30 seconds of sit and stands test, 4 step balance test   Minutes exercised this week 60 minutes    Classes attended to date [address] 07/07/2023, 3:24 PM

## 2023-07-14 NOTE — Progress Notes (Signed)
 [facility] PREP Evaluation  Patient Details  Name: Joy Cobb MRN: [id number] Date of Birth: 05/27/1980 Age: 43 y.o. PCP: Cleave Curling, MD  Vitals:   07/14/23 1444  BP: (!) 156/94  Pulse: 94  SpO2: 96%  Weight: 279 lb 3.2 oz (126.6 kg)     [facility] Eval - 07/14/23 1400       [facility] "PREP" Location   [facility] "PREP" Location [facility]      Measurement   Waist Circumference 44 inches    Waist Circumference End Program 44 inches    Hip Circumference 55.5 inches    Hip Circumference End Program 56 inches    Body fat 48.1 percent      Mobility and Daily Activities   I find it easy to walk up or down two or more flights of stairs. 1    I have no trouble taking out the trash. 4    I do housework such as vacuuming and dusting on my own without difficulty. 1    I can easily lift a gallon of milk (8lbs). 2    I can easily walk a mile. 4    I have no trouble reaching into high cupboards or reaching down to pick up something from the floor. 2    I do not have trouble doing out-door work such as Loss adjuster, chartered, raking leaves, or gardening. 2      Mobility and Daily Activities   I feel younger than my age. 2    I feel independent. 1    I feel energetic. 4    I live an active life.  4    I feel strong. 1    I feel healthy. 4    I feel active as other people my age. 1      How fit and strong are you.   Fit and Strong Total Score 33            Past Medical History:  Diagnosis Date   Bladder spasms    Colonic inertia    GERD (gastroesophageal reflux disease)    IBS (irritable bowel syndrome)    Insomnia    Multiple sclerosis (HCC)    Neuropathy    Vision abnormalities    Past Surgical History:  Procedure Laterality Date   BREAST SURGERY Right 2005   CYSTECTOMY     polyp removal     UPPER GASTROINTESTINAL ENDOSCOPY     Social History   Tobacco Use  Smoking Status Former   Types: E-cigarettes   Start date: 02/05/2020   Passive exposure: Never  Smokeless  Tobacco Never  Tobacco Comments   Vapes some but not on a regular basis. 06/18/21. Hsm.   Percent body fat went from 48.4 to 48.1 2 min march test went from 224 to 246 30 second sit and stand went from 12 to 17 How Fit and Strong increased from 30 to 33 Education sessions attended: 9 Workout sessions attended: 10 Discussed elevated blood pressure, 142/94 at beginning, 156/94 at end. Recommended speaking with Dr. Elnita Hai about this.  Majel Scott 07/14/2023, 2:48 PM

## 2023-07-29 ENCOUNTER — Ambulatory Visit
Admission: RE | Admit: 2023-07-29 | Discharge: 2023-07-29 | Disposition: A | Discharge status: Home / Self Care | Source: Ambulatory Visit | Attending: Internal Medicine | Admitting: Internal Medicine

## 2023-07-29 DIAGNOSIS — Z1231 Encounter for screening mammogram for malignant neoplasm of breast: Secondary | ICD-10-CM

## 2023-07-30 DIAGNOSIS — H52223 Regular astigmatism, bilateral: Secondary | ICD-10-CM | POA: Diagnosis not present

## 2023-08-05 ENCOUNTER — Other Ambulatory Visit: Payer: Self-pay | Admitting: Family Medicine

## 2023-08-05 MED ORDER — FENTANYL 25 MCG/HR TD PT72: 1.0000 | MEDICATED_PATCH | TRANSDERMAL | 0 refills | Status: DC

## 2023-08-05 NOTE — Telephone Encounter (Signed)
 Pt called to request medication refill   fentaNYL  (DURAGESIC ) 25 MCG/HR   Pt would [facility] sent to      [id number] [place],  - [address] (Ph: [phone])

## 2023-08-05 NOTE — Telephone Encounter (Signed)
 Last seen 05/12/23 and next f/u 12/17/23.  Last refilled 07/04/23 #10.

## 2023-08-15 ENCOUNTER — Other Ambulatory Visit: Payer: Self-pay | Admitting: Internal Medicine

## 2023-09-04 ENCOUNTER — Other Ambulatory Visit: Payer: Self-pay | Admitting: Family Medicine

## 2023-09-04 MED ORDER — FENTANYL 25 MCG/HR TD PT72
1.0000 | MEDICATED_PATCH | TRANSDERMAL | 0 refills | Status: DC
Start: 2023-09-04 — End: 2023-10-02

## 2023-09-04 NOTE — Telephone Encounter (Signed)
 Last seen 05/12/23 and next f/u 12/17/23. Last refilled 08/05/23 #10.

## 2023-09-04 NOTE — Telephone Encounter (Signed)
 Patient request refill for fentaNYL  (DURAGESIC ) 25 MCG/HR [facility] [phone]

## 2023-09-08 ENCOUNTER — Other Ambulatory Visit: Payer: Self-pay | Admitting: Internal Medicine

## 2023-09-12 ENCOUNTER — Other Ambulatory Visit: Payer: Self-pay | Admitting: Internal Medicine

## 2023-09-17 ENCOUNTER — Other Ambulatory Visit: Payer: Self-pay | Admitting: Internal Medicine

## 2023-09-21 ENCOUNTER — Other Ambulatory Visit: Payer: Self-pay | Admitting: Internal Medicine

## 2023-09-22 ENCOUNTER — Other Ambulatory Visit (HOSPITAL_COMMUNITY)
Admission: RE | Admit: 2023-09-22 | Discharge: 2023-09-22 | Disposition: A | Discharge status: Home / Self Care | Source: Ambulatory Visit | Attending: Internal Medicine | Admitting: Internal Medicine

## 2023-09-22 ENCOUNTER — Encounter: Payer: Self-pay | Admitting: Internal Medicine

## 2023-09-22 ENCOUNTER — Other Ambulatory Visit: Payer: Self-pay | Admitting: Internal Medicine

## 2023-09-22 ENCOUNTER — Ambulatory Visit (INDEPENDENT_AMBULATORY_CARE_PROVIDER_SITE_OTHER): Payer: Self-pay | Admitting: Internal Medicine

## 2023-09-22 VITALS — BP 122/80 | HR 75 | Temp 98.4°F | Ht 65.0 in | Wt 284.8 lb

## 2023-09-22 DIAGNOSIS — E66813 Obesity, class 3: Secondary | ICD-10-CM | POA: Diagnosis not present

## 2023-09-22 DIAGNOSIS — K5909 Other constipation: Secondary | ICD-10-CM

## 2023-09-22 DIAGNOSIS — G35 Multiple sclerosis: Secondary | ICD-10-CM

## 2023-09-22 DIAGNOSIS — Z1211 Encounter for screening for malignant neoplasm of colon: Secondary | ICD-10-CM

## 2023-09-22 DIAGNOSIS — F3281 Premenstrual dysphoric disorder: Secondary | ICD-10-CM

## 2023-09-22 DIAGNOSIS — Z6841 Body Mass Index (BMI) 40.0 and over, adult: Secondary | ICD-10-CM

## 2023-09-22 DIAGNOSIS — Z01419 Encounter for gynecological examination (general) (routine) without abnormal findings: Secondary | ICD-10-CM | POA: Insufficient documentation

## 2023-09-22 DIAGNOSIS — F321 Major depressive disorder, single episode, moderate: Secondary | ICD-10-CM

## 2023-09-22 DIAGNOSIS — Z1151 Encounter for screening for human papillomavirus (HPV): Secondary | ICD-10-CM | POA: Diagnosis not present

## 2023-09-22 DIAGNOSIS — Z0000 Encounter for general adult medical examination without abnormal findings: Secondary | ICD-10-CM | POA: Diagnosis not present

## 2023-09-22 LAB — POC HEMOCCULT BLD/STL (OFFICE/1-CARD/DIAGNOSTIC): Fecal Occult Blood, POC: NEGATIVE

## 2023-09-22 MED ORDER — SUCRALFATE 1 GM/10ML PO SUSP: 1.0000 g | Freq: Two times a day (BID) | ORAL | 2 refills | Status: DC | PRN

## 2023-09-22 MED ORDER — FLUOXETINE HCL 40 MG PO CAPS: 40.0000 mg | ORAL_CAPSULE | Freq: Every day | ORAL | 1 refills | Status: DC

## 2023-09-22 MED ORDER — TRAZODONE HCL 100 MG PO TABS: 100.0000 mg | ORAL_TABLET | Freq: Every day | ORAL | 2 refills | Status: AC

## 2023-09-22 MED ORDER — HYDROXYZINE HCL 25 MG PO TABS: 25.0000 mg | ORAL_TABLET | Freq: Every evening | ORAL | 2 refills | Status: DC

## 2023-09-22 MED ORDER — LINACLOTIDE 290 MCG PO CAPS: 290.0000 ug | ORAL_CAPSULE | Freq: Every day | ORAL | 1 refills | Status: AC | PRN

## 2023-09-22 MED ORDER — PANTOPRAZOLE SODIUM 40 MG PO TBEC: 40.0000 mg | DELAYED_RELEASE_TABLET | Freq: Every day | ORAL | 1 refills | Status: DC

## 2023-09-22 MED ORDER — DROSPIRENONE-ETHINYL ESTRADIOL 3-0.02 MG PO TABS: 1.0000 | ORAL_TABLET | Freq: Every day | ORAL | 2 refills | Status: AC

## 2023-09-22 NOTE — Patient Instructions (Signed)

## 2023-09-22 NOTE — Progress Notes (Signed)
 I,Victoria T Emmitt, CMA,acting as a neurosurgeon for Joy LOISE Slocumb, MD.,have documented all relevant documentation on the behalf of Joy LOISE Slocumb, MD,as directed by  Joy LOISE Slocumb, MD while in the presence of Joy LOISE Slocumb, MD.  Subjective:    Patient ID: Joy Cobb , female    DOB: October 11, 1980 , 43 y.o.   MRN: [id number]  Chief Complaint  Patient presents with   Annual Exam    Patient presents today for annual exam. She reports compliance with medications. She is not currently followed by GYN.  She would like a referral for GI. She is currently having issues with her stomach. IBS & acid reflux. Along with a referral to GYN.     HPI Discussed the use of AI scribe software for clinical note transcription with the patient, who gave verbal consent to proceed.  History of Present Illness Joy Cobb is a 43 year old female who presents for a routine exam and Pap smear.  She has not had a Pap smear in the past year and is here to maintain compliance with her health screenings. She recently completed a breast exam and mammogram.  She participates in an exercise program twice a week but is disappointed with her weight loss efforts, attributing challenges to her current medication.  She has not had menstrual cycles for 24 years due to continuous use of Yaz for her PMDD, which has stabilized her hormones and improved her quality of life. She experiences occasional spotting once or twice a year.  She is practicing celibacy and has not been sexually active for over two and a half years, which she states has helped reduce stress and lower her blood pressure.  She has multiple sclerosis and is currently on Lyrica  and Tecfidera . She does not wish to change her treatment regimen. Initially, she experienced visual symptoms and weakness in her legs, which persist.  Her bowels are 'barely moving' but still functioning. She experiences breast tenderness, described as a constant ache. No  recent spotting or blood in stool.      Past Medical History:  Diagnosis Date   Bladder spasms    Colonic inertia    GERD (gastroesophageal reflux disease)    IBS (irritable bowel syndrome)    Insomnia    Multiple sclerosis (HCC)    Neuropathy    Vision abnormalities      Family History  Problem Relation Age of Onset   Neuropathy Mother    Allergic rhinitis Mother    Urticaria Mother    GER disease Mother    Cancer - Other Father    Thyroid  disease Maternal Aunt    Breast cancer Maternal Aunt    Parkinson's disease Maternal Aunt    Breast cancer Maternal Aunt    Diabetes Maternal Grandmother    Hypertension Maternal Grandmother    Kidney failure Maternal Grandmother    Parkinson's disease Maternal Grandmother    Kidney disease Maternal Grandfather    Hyperlipidemia Paternal Grandmother    Hyperlipidemia Paternal Grandfather    Breast cancer Cousin    Colon cancer Other    Asthma Neg Hx    Eczema Neg Hx    Esophageal cancer Neg Hx    Rectal cancer Neg Hx    Stomach cancer Neg Hx    Multiple sclerosis Neg Hx      Current Outpatient Medications:    albuterol  (VENTOLIN  HFA) 108 (90 Base) MCG/ACT inhaler, Inhale 2 puffs into the lungs every 6 (six) hours as needed  for wheezing or shortness of breath., Disp: 18 g, Rfl: 0   baclofen  (LIORESAL ) 10 MG tablet, TAKE 1 TABLET BY MOUTH THREE TIMES A DAY, Disp: 270 tablet, Rfl: 3   cetirizine  (ZYRTEC  ALLERGY) 10 MG tablet, Take 1 tablet (10 mg total) by mouth daily., Disp: 30 tablet, Rfl: 0   fentaNYL  (DURAGESIC ) 25 MCG/HR, Place 1 patch onto the skin every 3 (three) days., Disp: 10 patch, Rfl: 0   meloxicam  (MOBIC ) 15 MG tablet, TAKE 1 TABLET (15 MG TOTAL) BY MOUTH DAILY AS NEEDED (FOR HIP PAIN FLARE UPS)., Disp: 30 tablet, Rfl: 2   metoCLOPramide  (REGLAN ) 5 MG tablet, TAKE 2 TABLETS BY MOUTH TWICE A DAY, Disp: 120 tablet, Rfl: 3   nystatin  cream (MYCOSTATIN ), Apply topically 2 (two) times daily., Disp: 30 g, Rfl: 1    pregabalin  (LYRICA ) 150 MG capsule, TAKE ONE CAPSULE BY MOUTH AT BEDTIME, Disp: 90 capsule, Rfl: 1   TECFIDERA  240 MG CPDR, TAKE 1 CAPSULE (240MG ) BY MOUTH  TWICE DAILY, Disp: 180 capsule, Rfl: 5   triamcinolone  cream (KENALOG ) 0.1 %, APPLY TO AFFECTED AREA TWICE DAILY AS NEEDED, Disp: 45 g, Rfl: 0   triamcinolone  cream (KENALOG ) 0.5 %, Apply 1 Application topically daily as needed., Disp: , Rfl:    drospirenone -ethinyl estradiol  (NIKKI ) 3-0.02 MG tablet, Take 1 tablet by mouth daily., Disp: 84 tablet, Rfl: 2   FLUoxetine  (PROZAC ) 40 MG capsule, Take 1 capsule (40 mg total) by mouth daily., Disp: 90 capsule, Rfl: 1   hydrOXYzine  (ATARAX ) 25 MG tablet, Take 1 tablet (25 mg total) by mouth at bedtime., Disp: 90 tablet, Rfl: 2   linaclotide  (LINZESS ) 290 MCG CAPS capsule, Take 1 capsule (290 mcg total) by mouth daily as needed., Disp: 90 capsule, Rfl: 1   pantoprazole  (PROTONIX ) 40 MG tablet, Take 1 tablet (40 mg total) by mouth daily., Disp: 90 tablet, Rfl: 1   sucralfate  (CARAFATE ) 1 GM/10ML suspension, TAKE 10 MLS (1 G TOTAL) BY MOUTH 2 (TWO) TIMES DAILY AS NEEDED., Disp: 600 mL, Rfl: 2   traZODone  (DESYREL ) 100 MG tablet, Take 1 tablet (100 mg total) by mouth at bedtime., Disp: 90 tablet, Rfl: 2   Allergies  Allergen Reactions   Penicillins Hives   Temazepam Nausea Only    Sharp pains in stomach   Amoxicillin Rash   Septra [Sulfamethoxazole-Trimethoprim] Rash      The patient states she uses OCP (estrogen/progesterone) for birth control. No LMP recorded (lmp unknown). (Menstrual status: Oral contraceptives).. Negative for Dysmenorrhea. Negative for: breast discharge, breast lump(s), breast pain and breast self exam. Associated symptoms include abnormal vaginal bleeding. Pertinent negatives include abnormal bleeding (hematology), anxiety, decreased libido, depression, difficulty falling sleep, dyspareunia, history of infertility, nocturia, sexual dysfunction, sleep disturbances, urinary  incontinence, urinary urgency, vaginal discharge and vaginal itching. Diet regular.The patient states her exercise level is  intermittent.  . The patient's tobacco use is:  Social History   Tobacco Use  Smoking Status Former   Types: E-cigarettes   Start date: 02/05/2020   Passive exposure: Never  Smokeless Tobacco Never  Tobacco Comments   Vapes some but not on a regular basis. 06/18/21. Hsm.   . She has been exposed to passive smoke. The patient's alcohol use is:  Social History   Substance and Sexual Activity  Alcohol Use Yes   Alcohol/week: 2.0 standard drinks of alcohol   Types: 1 Glasses of wine, 1 Cans of beer per week   Comment: socially    Review of Systems  Constitutional: Negative.   HENT: Negative.    Eyes: Negative.   Respiratory: Negative.    Cardiovascular: Negative.   Gastrointestinal: Negative.   Endocrine: Negative.   Genitourinary: Negative.   Musculoskeletal: Negative.   Skin: Negative.   Allergic/Immunologic: Negative.   Neurological: Negative.   Hematological: Negative.   Psychiatric/Behavioral: Negative.       Today's Vitals   09/22/23 1105  BP: 122/80  Pulse: 75  Temp: 98.4 F (36.9 C)  SpO2: 98%  Weight: 284 lb 12.8 oz (129.2 kg)  Height: 5' 5 (1.651 m)   Body mass index is 47.39 kg/m.  Wt Readings from Last 3 Encounters:  09/22/23 284 lb 12.8 oz (129.2 kg)  07/14/23 279 lb 3.2 oz (126.6 kg)  07/07/23 279 lb 3.2 oz (126.6 kg)     Objective:  Physical Exam Vitals and nursing note reviewed. Exam conducted with a chaperone present.  Constitutional:      Appearance: Normal appearance. She is obese.  HENT:     Head: Normocephalic and atraumatic.     Right Ear: Tympanic membrane, ear canal and external ear normal.     Left Ear: Tympanic membrane, ear canal and external ear normal.     Nose: Nose normal.     Mouth/Throat:     Mouth: Mucous membranes are moist.     Pharynx: Oropharynx is clear.  Eyes:     Extraocular Movements:  Extraocular movements intact.     Conjunctiva/sclera: Conjunctivae normal.     Pupils: Pupils are equal, round, and reactive to light.  Cardiovascular:     Rate and Rhythm: Normal rate and regular rhythm.     Pulses: Normal pulses.     Heart sounds: Normal heart sounds.  Pulmonary:     Effort: Pulmonary effort is normal.     Breath sounds: Normal breath sounds.  Abdominal:     General: Bowel sounds are normal.     Palpations: Abdomen is soft.     Hernia: There is no hernia in the left inguinal area or right inguinal area.     Comments: Obese, soft. Difficult to assess organomegaly  Genitourinary:    General: Normal vulva.     Exam position: Supine.     Tanner stage (genital): 5.     Labia:        Right: No rash.        Left: No rash.      Vagina: Normal.     Cervix: Normal.     Adnexa:        Right: No mass.         Left: No mass.       Rectum: Normal. Guaiac result negative. No mass.     Comments: Difficult to assess organomegaly due to body habitus Musculoskeletal:        General: Normal range of motion.     Cervical back: Normal range of motion and neck supple.  Lymphadenopathy:     Lower Body: No right inguinal adenopathy. No left inguinal adenopathy.  Skin:    General: Skin is warm and dry.  Neurological:     General: No focal deficit present.     Mental Status: She is alert and oriented to person, place, and time.     Cranial Nerves: Cranial nerve deficit present.  Psychiatric:        Mood and Affect: Mood normal.        Behavior: Behavior normal.      Assessment And Plan:  Encounter for annual health examination Assessment & Plan: A full exam was performed, including pelvic exam.  Importance of monthly self breast exams was discussed with the patient.  She is advised to get 30-45 minutes of regular exercise, no less than four to five days per week. Both weight-bearing and aerobic exercises are recommended.  She is advised to follow a healthy diet with at  least six fruits/veggies per day, decrease intake of red meat and other saturated fats and to increase fish intake to twice weekly.  Meats/fish should not be fried -- baked, boiled or broiled is preferable. It is also important to cut back on your sugar intake.  Be sure to read labels - try to avoid anything with added sugar, high fructose corn syrup or other sweeteners.  If you must use a sweetener, you can try stevia or monkfruit.  It is also important to avoid artificially sweetened foods/beverages and diet drinks. Lastly, wear SPF 50 sunscreen on exposed skin and when in direct sunlight for an extended period of time.  Be sure to avoid fast food restaurants and aim for at least 60 ounces of water daily.      Orders: -     Cytology - PAP -     CMP14+EGFR -     Lipid panel  Multiple sclerosis (HCC) Assessment & Plan: Chronic, followed by Neuro. Most recent Neuro note reviewed. She will continue with baclofen , Lyrica  and Tecfidera  as per Neuro. She is encouraged to gradually increase her daily activity.    PMDD (premenstrual dysphoric disorder) Assessment & Plan: PMDD well-managed with Yaz, improving symptoms and quality of life. - Continue Yaz daily for PMDD management.   Chronic constipation Assessment & Plan: Chronic constipation persists with minimal bowel movements. - Continue with Linzess  290mg  daily - Previously seen by GI at [facility] - Increase daily activity - Will refer to GI as requested   Class 3 severe obesity due to excess calories with serious comorbidity and body mass index (BMI) of 45.0 to 49.9 in adult Assessment & Plan: Weight loss challenging despite exercise adherence, causing frustration. - Decrease intake of sugary foods/beverages and processed meats   Encounter for Hemoccult screening -     POC Hemoccult Bld/Stl (1-Cd Office Dx)  Other orders -     FLUoxetine  HCl; Take 1 capsule (40 mg total) by mouth daily.  Dispense: 90 capsule; Refill:  1 -     hydrOXYzine  HCl; Take 1 tablet (25 mg total) by mouth at bedtime.  Dispense: 90 tablet; Refill: 2 -     linaCLOtide ; Take 1 capsule (290 mcg total) by mouth daily as needed.  Dispense: 90 capsule; Refill: 1 -     Drospirenone -Ethinyl Estradiol ; Take 1 tablet by mouth daily.  Dispense: 84 tablet; Refill: 2 -     Pantoprazole  Sodium; Take 1 tablet (40 mg total) by mouth daily.  Dispense: 90 tablet; Refill: 1 -     traZODone  HCl; Take 1 tablet (100 mg total) by mouth at bedtime.  Dispense: 90 tablet; Refill: 2   Return in 6 months (on 03/24/2024), or med check, for 1 year HM. Patient was given opportunity to ask questions. Patient verbalized understanding of the plan and was able to repeat key elements of the plan. All questions were answered to their satisfaction.   I, Joy LOISE Slocumb, MD, have reviewed all documentation for this visit. The documentation on 09/22/23 for the exam, diagnosis, procedures, and orders are all accurate and complete.

## 2023-09-24 LAB — CMP14+EGFR
ALT: 12 IU/L (ref 0–32)
AST: 14 IU/L (ref 0–40)
Albumin: 4.1 g/dL (ref 3.9–4.9)
Alkaline Phosphatase: 94 IU/L (ref 44–121)
BUN/Creatinine Ratio: 12 (ref 9–23)
BUN: 10 mg/dL (ref 6–24)
Bilirubin Total: 0.3 mg/dL (ref 0.0–1.2)
CO2: 19 mmol/L — ABNORMAL LOW (ref 20–29)
Calcium: 9.4 mg/dL (ref 8.7–10.2)
Chloride: 104 mmol/L (ref 96–106)
Creatinine, Ser: 0.83 mg/dL (ref 0.57–1.00)
Globulin, Total: 3.6 g/dL (ref 1.5–4.5)
Glucose: 86 mg/dL (ref 70–99)
Potassium: 4.6 mmol/L (ref 3.5–5.2)
Sodium: 139 mmol/L (ref 134–144)
Total Protein: 7.7 g/dL (ref 6.0–8.5)
eGFR: 89 mL/min/1.73 (ref >59)

## 2023-09-24 LAB — LIPID PANEL
Chol/HDL Ratio: 2.2 ratio (ref 0.0–4.4)
Cholesterol, Total: 196 mg/dL (ref 100–199)
HDL: 90 mg/dL (ref >39)
LDL Chol Calc (NIH): 84 mg/dL (ref 0–99)
Triglycerides: 128 mg/dL (ref 0–149)
VLDL Cholesterol Cal: 22 mg/dL (ref 5–40)

## 2023-09-24 LAB — CYTOLOGY - PAP
Adequacy: ABSENT
Diagnosis: NEGATIVE

## 2023-09-25 ENCOUNTER — Ambulatory Visit: Payer: Self-pay | Admitting: Internal Medicine

## 2023-09-27 NOTE — Assessment & Plan Note (Signed)
 Weight loss challenging despite exercise adherence, causing frustration. - Decrease intake of sugary foods/beverages and processed meats

## 2023-09-27 NOTE — Assessment & Plan Note (Signed)
 A full exam was performed, including pelvic exam.  Importance of monthly self breast exams was discussed with the patient.  She is advised to get 30-45 minutes of regular exercise, no less than four to five days per week. Both weight-bearing and aerobic exercises are recommended.  She is advised to follow a healthy diet with at least six fruits/veggies per day, decrease intake of red meat and other saturated fats and to increase fish intake to twice weekly.  Meats/fish should not be fried -- baked, boiled or broiled is preferable. It is also important to cut back on your sugar intake.  Be sure to read labels - try to avoid anything with added sugar, high fructose corn syrup or other sweeteners.  If you must use a sweetener, you can try stevia or monkfruit.  It is also important to avoid artificially sweetened foods/beverages and diet drinks. Lastly, wear SPF 50 sunscreen on exposed skin and when in direct sunlight for an extended period of time.  Be sure to avoid fast food restaurants and aim for at least 60 ounces of water daily.

## 2023-09-27 NOTE — Assessment & Plan Note (Signed)
 Chronic, followed by Neuro. Most recent Neuro note reviewed. She will continue with baclofen , Lyrica  and Tecfidera  as per Neuro. She is encouraged to gradually increase her daily activity.

## 2023-09-28 DIAGNOSIS — K5909 Other constipation: Secondary | ICD-10-CM | POA: Insufficient documentation

## 2023-09-28 DIAGNOSIS — F3281 Premenstrual dysphoric disorder: Secondary | ICD-10-CM | POA: Insufficient documentation

## 2023-09-28 NOTE — Assessment & Plan Note (Signed)
 PMDD well-managed with Yaz, improving symptoms and quality of life. - Continue Yaz daily for PMDD management.

## 2023-09-28 NOTE — Assessment & Plan Note (Addendum)
 Chronic constipation persists with minimal bowel movements. - Continue with Linzess  290mg  daily - Previously seen by GI at [facility] - Increase daily activity - Will refer to GI as requested

## 2023-09-29 ENCOUNTER — Other Ambulatory Visit: Payer: Self-pay | Admitting: Internal Medicine

## 2023-09-29 DIAGNOSIS — K5909 Other constipation: Secondary | ICD-10-CM

## 2023-10-02 ENCOUNTER — Other Ambulatory Visit: Payer: Self-pay | Admitting: Family Medicine

## 2023-10-02 MED ORDER — FENTANYL 25 MCG/HR TD PT72
1.0000 | MEDICATED_PATCH | TRANSDERMAL | 0 refills | Status: DC
Start: 2023-10-02 — End: 2023-11-03

## 2023-10-02 NOTE — Telephone Encounter (Signed)
 Last seen on 05/12/23 Follow up scheduled on 12/17/23   Dispensed Days Supply Quantity Provider Pharmacy  FENTANYL  25 MCG/HR PATCH 09/04/2023 30 10 each Vear Charlie LABOR, MD [facility] [phone] - [facility]...   Rx pending to be signed

## 2023-10-02 NOTE — Telephone Encounter (Signed)
 Pt is needing a refill request for her fentaNYL  (DURAGESIC ) 25 MCG/HR [facility] [address]

## 2023-10-22 ENCOUNTER — Telehealth: Payer: Self-pay | Admitting: Internal Medicine

## 2023-10-22 NOTE — Telephone Encounter (Unsigned)
 Copied from CRM [id number]. Topic: Referral - Status >> Oct 22, 2023  4:32 PM Donee H wrote: Reason for CRM: Patient calling regarding referral status for Gastro. She states referral was requesting on last visit with Dr. Catheryn Slocumb on Aug. 18, 2025. Please follow up with patient on progress. Callback number [phone]

## 2023-10-28 ENCOUNTER — Other Ambulatory Visit: Payer: Self-pay | Admitting: Family Medicine

## 2023-10-28 MED ORDER — PREGABALIN 150 MG PO CAPS: ORAL_CAPSULE | ORAL | 1 refills | Status: AC

## 2023-10-28 NOTE — Telephone Encounter (Signed)
  Last seen on 05/12/23 Follow up scheduled on 12/17/23  Dispensed Days Supply Quantity Provider Pharmacy  PREGABALIN  150 MG CAPSULE 07/21/2023 90 90 each Lomax, Amy, NP [facility] [phone] - [facility].   Rx pending to be signed

## 2023-10-28 NOTE — Telephone Encounter (Signed)
 Pt is requesting a refill for pregabalin  (LYRICA ) 150 MG capsule .  [facility]arm[facility]: [facility] [phone]

## 2023-11-03 ENCOUNTER — Other Ambulatory Visit: Payer: Self-pay | Admitting: Neurology

## 2023-11-03 MED ORDER — FENTANYL 25 MCG/HR TD PT72: 1.0000 | MEDICATED_PATCH | TRANSDERMAL | 0 refills | Status: DC

## 2023-11-03 NOTE — Telephone Encounter (Signed)
 Pt is needing a refill request for her fentaNYL  (DURAGESIC ) 25 MCG/HR [facility] [address]

## 2023-11-03 NOTE — Telephone Encounter (Signed)
 Dr. Vear is out of the office Patient saw you on  05/12/23 Follow up scheduled on 12/17/23   Dispensed Days Supply Quantity Provider Pharmacy  FENTANYL  25 MCG/HR PATCH 10/02/2023 30 10 each Vear Charlie LABOR, MD [facility] [phone] - [facility]..     Rx pending to be signed

## 2023-12-02 ENCOUNTER — Other Ambulatory Visit: Payer: Self-pay | Admitting: Family Medicine

## 2023-12-02 MED ORDER — FENTANYL 25 MCG/HR TD PT72: 1.0000 | MEDICATED_PATCH | TRANSDERMAL | 0 refills | Status: DC

## 2023-12-02 NOTE — Telephone Encounter (Signed)
 Pt is needing a refill request for her fentaNYL  (DURAGESIC ) 25 MCG/HR [facility] [address]

## 2023-12-02 NOTE — Telephone Encounter (Signed)
 Requested Prescriptions   Pending Prescriptions Disp Refills   fentaNYL  (DURAGESIC ) 25 MCG/HR 10 patch 0    Sig: Place 1 patch onto the skin every 3 (three) days.   Last seen 05/12/23 Next appt not scheduled  Dispenses   Dispensed Days Supply Quantity Provider Pharmacy  FENTANYL  25 MCG/HR PATCH 11/03/2023 30 10 each Lomax, Amy, NP [facility] [phone] - G...  FENTANYL  25 MCG/HR PATCH 10/02/2023 30 10 each Sater, Charlie LABOR, MD [facility] [phone] - G...  FENTANYL  25 MCG/HR PATCH 09/04/2023 30 10 each Sater, Charlie LABOR, MD [facility] [phone] - G...  FENTANYL   25 MCG/HR PT72 08/05/2023 30 10 patch Sater, Charlie LABOR, MD [facility] [phone] - G...  FENTANYL  25 MCG/HR PATCH 07/04/2023 30 10 each Lomax, Amy, NP [facility] [phone] - G...  FENTANYL  25 MCG/HR PATCH 06/06/2023 30 10 each Ines Onetha NOVAK, MD [facility] [phone] - G...  FENTANYL  25 MCG/HR PATCH 05/07/2023 30 10 each Sater, Charlie LABOR, MD [facility] [phone] - G...  FENTANYL  25 MCG/HR PATCH 04/07/2023 30 10 each Lomax, Amy, NP [facility] [phone] - G...  FENTANYL  25 MCG/HR PATCH 03/06/2023 30 10 each Sater, Charlie LABOR, MD [facility] [phone] - G...  FENTANYL  25 MCG/HR PATCH 02/06/2023 30 10 each Sater, Charlie LABOR, MD [facility] [phone] - G...  FENTANYL  25 MCG/HR PATCH 01/06/2023 30 10 each Sater, Charlie LABOR, MD [facility] [phone] - G.SABRASABRA

## 2023-12-17 ENCOUNTER — Encounter: Payer: Self-pay | Admitting: Neurology

## 2023-12-17 ENCOUNTER — Ambulatory Visit (INDEPENDENT_AMBULATORY_CARE_PROVIDER_SITE_OTHER): Admitting: Neurology

## 2023-12-17 VITALS — BP 120/82 | HR 83 | Ht 66.0 in | Wt 284.5 lb

## 2023-12-17 DIAGNOSIS — M25552 Pain in left hip: Secondary | ICD-10-CM

## 2023-12-17 DIAGNOSIS — G35C1 Active secondary progressive multiple sclerosis: Secondary | ICD-10-CM

## 2023-12-17 DIAGNOSIS — G35D Multiple sclerosis, unspecified: Secondary | ICD-10-CM

## 2023-12-17 DIAGNOSIS — E559 Vitamin D deficiency, unspecified: Secondary | ICD-10-CM

## 2023-12-17 DIAGNOSIS — Z79899 Other long term (current) drug therapy: Secondary | ICD-10-CM | POA: Diagnosis not present

## 2023-12-17 DIAGNOSIS — G894 Chronic pain syndrome: Secondary | ICD-10-CM

## 2023-12-17 DIAGNOSIS — G8929 Other chronic pain: Secondary | ICD-10-CM

## 2023-12-17 DIAGNOSIS — R269 Unspecified abnormalities of gait and mobility: Secondary | ICD-10-CM

## 2023-12-17 DIAGNOSIS — M25551 Pain in right hip: Secondary | ICD-10-CM

## 2023-12-17 DIAGNOSIS — M8700 Idiopathic aseptic necrosis of unspecified bone: Secondary | ICD-10-CM

## 2023-12-17 DIAGNOSIS — Z79891 Long term (current) use of opiate analgesic: Secondary | ICD-10-CM

## 2023-12-17 MED ORDER — FENTANYL 12 MCG/HR TD PT72: 1.0000 | MEDICATED_PATCH | TRANSDERMAL | 0 refills | Status: DC

## 2023-12-17 MED ORDER — CLONAZEPAM 0.5 MG PO TABS: 0.5000 mg | ORAL_TABLET | Freq: Every day | ORAL | 3 refills | Status: AC

## 2023-12-17 NOTE — Progress Notes (Signed)
 [facility]  PATIENT: Joy Cobb DOB: 10/07/1980  REFERRING CLINICIAN: Zachary Osei-Bonsu  HISTORY FROM: [name] REASON FOR VISIT: MS and related symptoms   HISTORICAL  CHIEF COMPLAINT:  Chief Complaint  Patient presents with   Follow-up    Pt in room 11. Here for MS follow up. Needs refills    HISTORY OF PRESENT ILLNESS:  Joy Cobb is a 43 yo woman with MS diagnosed in 2012.   Now has active SPMS  Update 12/17/2023    She is on Tecfidera  a and tolerates it well.  She has no recent exacerbations but gait has slowly worsened.   She has tolerated Tecfidera  very well  She reports that gait and balance are a little wrose and she uses her cane.  She uses the banister and stairs.  She feels her walking distance is not as good as it was last year      she notes numbness and painful tingling in the hands and feet.   Initially her left side seemed weaker and more spastic though over the past couple years, the right side seems worse.    She takes baclofen  1 or two a day.     Patient is doing well.    Her bladder is doing about the same.  She has urgency and frequency but no incontinence.  She wakes up 2-3 times at night due to urgency.    She falls back asleep easily.   No hesitancy.     She has had more  REM behavior issues --- in the past, this was mostly just sleep talking but now she is acting out her dreams with kicking and screaming at times she has not gotten out of bed.   We discussed clonazepma may help but due to risks interaction with her fentanyl  patch, we will need to reduce fentanyl  dose to 12 mcg patch to improve safety of the combination.  She notes fatigue. Insomnia is about the same - some nights.    She has chronic hip pain due to avascular necrosis(had been on multiple doses of IV Solu-Medrol  early in the course of her MS).  She also has painful dysesthesias..   Fentanyl  patches have helped.  Many years ago she was on 50 mcg and we were able  to reduce her to 25 mcg with reasonable control of her pain..   The PDMP was reviewed.  She is only getting controlled substances from our office.  She is not escalating doses.   No drug seeking behavior.       04/05/2020 lymphocyte 1.8   MS History:   She was diagnosed with multiple sclerosis in April 2012 after her second episode of optic neuritis. Her previous episode was about 7 or 8 months earlier and an MRI of the brain at that time did not show changes consistent with MS. After her second episode she had another MRI which showed some changes in the brain consistent with MS. She received IV steroids for the second optic neuritis.  First episode was left and second episode was right.   A lumbar puncture showed the CSF was consistent with Multiple sclerosis.   She was initially placed on Betaseron. However, she continued to have exacerbations. Additionally, she had difficulty tolerating it well. In 2012, she had 3 other exacerbations while on Betaseron. At least some of them involved her spine and she had evidence on MRI and bilateral changes in her legs on exam. Due to the aggressiveness of the  MS, she received 8 courses of IV Cytoxan while she was living in [place] .  She then was switched to Tecfidera  in 2013 and remains on that medication.  She moved to [facility] in 2013 and started to see Dr. Juliane. When he moved further away, she transferred her care to me. In 2014, she had some worsening gait that was felt to be due to an exacerbation that she receive a course of steroids. She has not had any further exacerbations while on Tecfidera .  Her last MRI was early 2015 at [facility].  IMAGING MRI brain 11/09/2021 was unchanged compared 08/21/2019  MRI brain 08/21/2019 showed Scattered T2/FLAIR hyperintense foci in the hemispheres.  The pattern is consistent with chronic demyelinating plaque associated with multiple sclerosis.  None of the foci appear to be acute.  They do not enhance.   Compared to the MRI dated 09/08/2017, there are no new lesions.    There are no acute findings.     After contrast, there is noted to be a very small developmental venous anomaly in the right frontal lobe, less apparent on the current scan than on the previous scan.  MRI cervical spine 03/23/2015 showed minimal disc bulges at C3-C4 and C6-C7 that did not lead to any nerve root impingement. These are unchanged when compared to the MRI dated 11/17/2011.   The spinal cord appears normal..   There is a normal enhancement pattern.  REVIEW OF SYSTEMS:  Constitutional: No fevers, chills, sweats, or change in appetite.   Lost 29 pounds Eyes: No visual changes, double vision, eye pain Ear, nose and throat: No hearing loss, ear pain, nasal congestion, sore throat Cardiovascular: No chest pain, palpitations Respiratory:  No shortness of breath at rest or with exertion.   Snores but no OSA GastrointestinaI: Sh notes constipation.  No nausea, vomiting, diarrhea, abdominal pain, fecal incontinence Genitourinary:  Some urgency and frequency.. Musculoskeletal:  No neck pain, back pain Integumentary: No rash, pruritus, skin lesions Neurological: as above Psychiatric: see above Endocrine: No palpitations, diaphoresis, change in appetite, or increased thirst Hematologic/Lymphatic:  No anemia, purpura, petechiae. Allergic/Immunologic: No itchy/runny eyes, nasal congestion, recent allergic reactions, rashes  ALLERGIES: Allergies  Allergen Reactions   Penicillins Hives   Temazepam Nausea Only    Sharp pains in stomach   Amoxicillin Rash   Septra [Sulfamethoxazole-Trimethoprim] Rash    HOME MEDICATIONS: Outpatient Medications Prior to Visit  Medication Sig Dispense Refill   albuterol  (VENTOLIN  HFA) 108 (90 Base) MCG/ACT inhaler Inhale 2 puffs into the lungs every 6 (six) hours as needed for wheezing or shortness of breath. 18 g 0   baclofen  (LIORESAL ) 10 MG tablet TAKE 1 TABLET BY MOUTH THREE TIMES A DAY 270  tablet 3   cetirizine  (ZYRTEC  ALLERGY) 10 MG tablet Take 1 tablet (10 mg total) by mouth daily. 30 tablet 0   drospirenone -ethinyl estradiol  (NIKKI ) 3-0.02 MG tablet Take 1 tablet by mouth daily. 84 tablet 2   FLUoxetine  (PROZAC ) 40 MG capsule Take 1 capsule (40 mg total) by mouth daily. 90 capsule 1   hydrOXYzine  (ATARAX ) 25 MG tablet Take 1 tablet (25 mg total) by mouth at bedtime. 90 tablet 2   linaclotide  (LINZESS ) 290 MCG CAPS capsule Take 1 capsule (290 mcg total) by mouth daily as needed. 90 capsule 1   meloxicam  (MOBIC ) 15 MG tablet TAKE 1 TABLET (15 MG TOTAL) BY MOUTH DAILY AS NEEDED (FOR HIP PAIN FLARE UPS). 30 tablet 2   metoCLOPramide  (REGLAN ) 5 MG tablet TAKE 2  TABLETS BY MOUTH TWICE A DAY 120 tablet 3   nystatin  cream (MYCOSTATIN ) Apply topically 2 (two) times daily. 30 g 1   pantoprazole  (PROTONIX ) 40 MG tablet Take 1 tablet (40 mg total) by mouth daily. 90 tablet 1   pregabalin  (LYRICA ) 150 MG capsule TAKE ONE CAPSULE BY MOUTH AT BEDTIME 90 capsule 1   sucralfate  (CARAFATE ) 1 GM/10ML suspension TAKE 10 MLS (1 G TOTAL) BY MOUTH 2 (TWO) TIMES DAILY AS NEEDED. 600 mL 2   TECFIDERA  240 MG CPDR TAKE 1 CAPSULE (240MG ) BY MOUTH  TWICE DAILY 180 capsule 5   traZODone  (DESYREL ) 100 MG tablet Take 1 tablet (100 mg total) by mouth at bedtime. 90 tablet 2   triamcinolone  cream (KENALOG ) 0.1 % APPLY TO AFFECTED AREA TWICE DAILY AS NEEDED 45 g 0   triamcinolone  cream (KENALOG ) 0.5 % Apply 1 Application topically daily as needed.     fentaNYL  (DURAGESIC ) 25 MCG/HR Place 1 patch onto the skin every 3 (three) days. 10 patch 0   No facility-administered medications prior to visit.    PAST MEDICAL HISTORY: Past Medical History:  Diagnosis Date   Bladder spasms    Colonic inertia    GERD (gastroesophageal reflux disease)    IBS (irritable bowel syndrome)    Insomnia    Multiple sclerosis    Neuropathy    Vision abnormalities     PAST SURGICAL HISTORY: Past Surgical History:  Procedure  Laterality Date   BREAST SURGERY Right 2005   CYSTECTOMY     polyp removal     UPPER GASTROINTESTINAL ENDOSCOPY      FAMILY HISTORY: Family History  Problem Relation Age of Onset   Neuropathy Mother    Allergic rhinitis Mother    Urticaria Mother    GER disease Mother    Cancer - Other Father    Thyroid  disease Maternal Aunt    Breast cancer Maternal Aunt    Parkinson's disease Maternal Aunt    Breast cancer Maternal Aunt    Diabetes Maternal Grandmother    Hypertension Maternal Grandmother    Kidney failure Maternal Grandmother    Parkinson's disease Maternal Grandmother    Kidney disease Maternal Grandfather    Hyperlipidemia Paternal Grandmother    Hyperlipidemia Paternal Grandfather    Breast cancer Cousin    Colon cancer Other    Asthma Neg Hx    Eczema Neg Hx    Esophageal cancer Neg Hx    Rectal cancer Neg Hx    Stomach cancer Neg Hx    Multiple sclerosis Neg Hx     SOCIAL HISTORY:  Social History   Socioeconomic History   Marital status: Single    Spouse name: Not on file   Number of children: Not on file   Years of education: Not on file   Highest education level: Associate degree: occupational, scientist, product/process development, or vocational program  Occupational History   Not on file  Tobacco Use   Smoking status: Former    Types: E-cigarettes    Start date: 02/05/2020    Passive exposure: Never   Smokeless tobacco: Never   Tobacco comments:    Vapes some but not on a regular basis. 06/18/21. Hsm.   Vaping Use   Vaping status: Former   Quit date: 07/04/2022  Substance and Sexual Activity   Alcohol use: Yes    Alcohol/week: 2.0 standard drinks of alcohol    Types: 1 Glasses of wine, 1 Cans of beer per week    Comment:  socially   Drug use: No   Sexual activity: Not Currently    Birth control/protection: Pill  Other Topics Concern   Not on file  Social History Narrative   Pt not working    Pt lives alone    Social Drivers of Health   Financial Resource Strain:  Low Risk  (09/21/2023)   Overall Financial Resource Strain (CARDIA)    Difficulty of Paying Living Expenses: Not very hard  Food Insecurity: No Food Insecurity (09/21/2023)   Hunger Vital Sign    Worried About Running Out of Food in the Last Year: Never true    Ran Out of Food in the Last Year: Never true  Transportation Needs: No Transportation Needs (09/21/2023)   PRAPARE - Administrator, Civil Service (Medical): No    Lack of Transportation (Non-Medical): No  Physical Activity: Insufficiently Active (09/21/2023)   Exercise Vital Sign    Days of Exercise per Week: 2 days    Minutes of Exercise per Session: 20 min  Stress: No Stress Concern Present (09/21/2023)   [employer] of Occupational Health - Occupational Stress Questionnaire    Feeling of Stress: Only a little  Social Connections: Moderately Isolated (09/21/2023)   Social Connection and Isolation Panel    Frequency of Communication with Friends and Family: More than three times a week    Frequency of Social Gatherings with Friends and Family: Three times a week    Attends Religious Services: 1 to 4 times per year    Active Member of Clubs or Organizations: No    Attends Banker Meetings: Not on file    Marital Status: Never married  Intimate Partner Violence: Not At Risk (05/30/2023)   Humiliation, Afraid, Rape, and Kick questionnaire    Fear of Current or Ex-Partner: No    Emotionally Abused: No    Physically Abused: No    Sexually Abused: No     PHYSICAL EXAM  Vitals:   12/17/23 1336  BP: 120/82  Pulse: 83  SpO2: 97%  Weight: 284 lb 8 oz (129 kg)  Height: 5' 6 (1.676 m)    Body mass index is 45.92 kg/m.   General: The patient is well-developed and well-nourished and in no acute distress  Neurologic Exam  Mental status: The patient is alert and oriented x 3 at the time of the examination. The patient has apparent normal recent and remote memory, with an apparently normal  attention span and concentration ability.   Speech is normal.  Cranial nerves: Extraocular movements are full.  There is a right APD.  Color vision was symmetric.  Facial strength and sensation was normal.. The tongue is midline, and the patient has symmetric elevation of the soft palate. No obvious hearing deficits are noted.  Motor:  Muscle bulk is normal. Tone is slightly increased in both legs.. Strength is  5 / 5 in all 4 extremities.   Sensory: She reported intact sensation to touch and vibration in the arms and legs.  Coordination: Cerebellar testing reveals good finger-nose-finger and slightly reduced heel-to-shin bilaterally.  Gait and station: Station is normal.  Gait is mildly wide.  Tandem gait is moderately wide.. Romberg is negative.   Reflexes: Deep tendon reflexes are symmetric and increased at the knees with crossed adductors    DIAGNOSTIC DATA (LABS, IMAGING, TESTING) - I reviewed patient records, labs, notes, testing and imaging myself where available.  Lab Results  Component Value Date   WBC 5.9 05/12/2023  HGB 11.8 05/12/2023   HCT 34.1 05/12/2023   MCV 86 05/12/2023   PLT 213 05/12/2023      Component Value Date/Time   NA 139 09/22/2023 1203   K 4.6 09/22/2023 1203   CL 104 09/22/2023 1203   CO2 19 (L) 09/22/2023 1203   GLUCOSE 86 09/22/2023 1203   GLUCOSE 98 02/14/2012 2345   BUN 10 09/22/2023 1203   CREATININE 0.83 09/22/2023 1203   CALCIUM 9.4 09/22/2023 1203   PROT 7.7 09/22/2023 1203   ALBUMIN 4.1 09/22/2023 1203   AST 14 09/22/2023 1203   ALT 12 09/22/2023 1203   ALKPHOS 94 09/22/2023 1203   BILITOT 0.3 09/22/2023 1203   GFRNONAA >90 02/14/2012 2345   GFRAA >90 02/14/2012 2345      ASSESSMENT AND PLAN   Active secondary progressive multiple sclerosis  Multiple sclerosis  Chronic pain syndrome  High risk medication use  Chronic pain of both hips  Chronic prescription opiate use  Vitamin D  deficiency   1.   Continue  Tecfidera  for now.  We discussed in future the BTKi may be good option as she has had a little progression.   Check cbc/diff today.   Check MRI brain 2.    PDMP reviewed.  We will check a urine drug screen today.  Prescriptions for controlled substances only coming from this office.We will reduce fentanyl  to 12.5 patch from 25 mcg patch as I am going to add clonazepam for Rem Behavior disorder.  In the past, she has had THC 3.   Stay active and exercise as tolerated. 4.   She will return to see us   in 6 months or sooner if she has new or worsening neurologic symptoms   This visit is part of a comprehensive longitudinal care medical relationship regarding the patients primary diagnosis of active SPMS and related concerns.  Meghin Thivierge A. Vear, MD, PhD 12/17/2023, 2:23 PM Certified in Neurology, Clinical Neurophysiology, Sleep Medicine, Pain Medicine and Neuroimaging  [facility] [address], [address] [place], [place] [address] [phone]

## 2023-12-18 ENCOUNTER — Telehealth: Payer: Self-pay | Admitting: Neurology

## 2023-12-18 LAB — CBC WITH DIFFERENTIAL/PLATELET
Basophils Absolute: 0 x10E3/uL (ref 0.0–0.2)
Basos: 0 %
EOS (ABSOLUTE): 0.1 x10E3/uL (ref 0.0–0.4)
Eos: 1 %
Hematocrit: 33.7 % — ABNORMAL LOW (ref 34.0–46.6)
Hemoglobin: 11.5 g/dL (ref 11.1–15.9)
Immature Grans (Abs): 0 x10E3/uL (ref 0.0–0.1)
Immature Granulocytes: 0 %
Lymphocytes Absolute: 2.4 x10E3/uL (ref 0.7–3.1)
Lymphs: 30 %
MCH: 29.5 pg (ref 26.6–33.0)
MCHC: 34.1 g/dL (ref 31.5–35.7)
MCV: 86 fL (ref 79–97)
Monocytes Absolute: 0.6 x10E3/uL (ref 0.1–0.9)
Monocytes: 8 %
Neutrophils Absolute: 4.7 x10E3/uL (ref 1.4–7.0)
Neutrophils: 61 %
Platelets: 237 x10E3/uL (ref 150–450)
RBC: 3.9 x10E6/uL (ref 3.77–5.28)
RDW: 13.3 % (ref 11.7–15.4)
WBC: 7.8 x10E3/uL (ref 3.4–10.8)

## 2023-12-18 LAB — DRUG SCREEN, URINE
Amphetamines, Urine: NEGATIVE ng/mL
Barbiturate screen, urine: NEGATIVE ng/mL
Benzodiazepine Quant, Ur: NEGATIVE ng/mL
Cannabinoid Quant, Ur: POSITIVE ng/mL — AB
Cocaine (Metab.): NEGATIVE ng/mL
Opiate Quant, Ur: NEGATIVE ng/mL
PCP Quant, Ur: NEGATIVE ng/mL

## 2023-12-18 NOTE — Telephone Encounter (Signed)
 No auth required sent to [facility] [phone]

## 2023-12-28 ENCOUNTER — Other Ambulatory Visit: Payer: Self-pay | Admitting: Internal Medicine

## 2024-01-05 ENCOUNTER — Ambulatory Visit: Payer: Self-pay | Admitting: Neurology

## 2024-01-05 ENCOUNTER — Ambulatory Visit
Admission: RE | Admit: 2024-01-05 | Discharge: 2024-01-05 | Disposition: A | Source: Ambulatory Visit | Attending: Neurology | Admitting: Neurology

## 2024-01-05 DIAGNOSIS — G35C1 Active secondary progressive multiple sclerosis: Secondary | ICD-10-CM | POA: Diagnosis not present

## 2024-01-05 DIAGNOSIS — R269 Unspecified abnormalities of gait and mobility: Secondary | ICD-10-CM

## 2024-01-05 MED ORDER — GADOPICLENOL 0.5 MMOL/ML IV SOLN
10.0000 mL | Freq: Once | INTRAVENOUS | Status: AC | PRN
  Administered 2024-01-05: 10 mL via INTRAVENOUS

## 2024-01-13 ENCOUNTER — Ambulatory Visit: Admitting: Gastroenterology

## 2024-01-15 ENCOUNTER — Other Ambulatory Visit: Payer: Self-pay | Admitting: Neurology

## 2024-01-15 MED ORDER — FENTANYL 12 MCG/HR TD PT72: 1.0000 | MEDICATED_PATCH | TRANSDERMAL | 0 refills | Status: DC

## 2024-01-15 NOTE — Telephone Encounter (Signed)
 Pt is requesting a refill for fentaNYL  (DURAGESIC ) 12 MCG/HR.  Pharmacy: [facility] [phone]

## 2024-01-15 NOTE — Telephone Encounter (Signed)
 Last seen 12-17-2023.  Next OV 07-21-2024.  Last fill 12-17-2023 #10.

## 2024-01-19 ENCOUNTER — Encounter: Payer: Self-pay | Admitting: Gastroenterology

## 2024-01-19 ENCOUNTER — Ambulatory Visit: Admitting: Gastroenterology

## 2024-01-19 VITALS — BP 120/76 | HR 77 | Ht 66.0 in | Wt 284.4 lb

## 2024-01-19 DIAGNOSIS — K219 Gastro-esophageal reflux disease without esophagitis: Secondary | ICD-10-CM

## 2024-01-19 DIAGNOSIS — G35D Multiple sclerosis, unspecified: Secondary | ICD-10-CM

## 2024-01-19 DIAGNOSIS — K3184 Gastroparesis: Secondary | ICD-10-CM

## 2024-01-19 DIAGNOSIS — K5909 Other constipation: Secondary | ICD-10-CM

## 2024-01-19 DIAGNOSIS — K581 Irritable bowel syndrome with constipation: Secondary | ICD-10-CM

## 2024-01-19 MED ORDER — PANTOPRAZOLE SODIUM 40 MG PO TBEC: 40.0000 mg | DELAYED_RELEASE_TABLET | Freq: Two times a day (BID) | ORAL | 3 refills | Status: AC

## 2024-01-19 NOTE — Patient Instructions (Addendum)
 We have sent the following medications to your pharmacy for you to pick up at your convenience: Pantoprazole  40 mg twice a day.   Thank you for trusting me with your gastrointestinal care!   Bayley McMichael, PA-C   Gastroparesis Gastroparesis is a condition in which food takes longer than normal to empty from the stomach.  This condition is also known as delayed gastric emptying. It is usually a long-term (chronic) condition.  What are the signs or symptoms? Symptoms of this condition include: Feeling full after eating very little or a loss of appetite. Nausea, vomiting, or heartburn. Bloating of your abdomen. Inconsistent blood sugar (glucose) levels on blood tests. Unexplained weight loss. Acid from the stomach coming up into the esophagus (gastroesophageal reflux). Sudden tightening (spasm) of the stomach, which can be painful. Symptoms may come and go. Some people may not notice any symptoms.  What increases the risk? You are more likely to develop this condition if: You have certain disorders or diseases. These may include: An endocrine disorder. An eating disorder. Amyloidosis. Scleroderma. Parkinson's disease. Multiple sclerosis. Cancer or infection of the stomach or the vagus nerve. You have had surgery on your stomach or vagus nerve. You take certain medicines. You are female.  Things you can do: Please do small frequent meals like 4-6 meals a day.  Eat and drink liquids at separate times.  Avoid high fiber foods, cook your vegetables, avoid high fat food.  Suggest spreading protein throughout the day (greek yogurt, glucerna, soft meat, milk, eggs) Choose soft foods that you can mash with a fork When you are more symptomatic, change to pureed foods foods and liquids.  Consider reading Living well with Gastroparesis by [facility] Check out this link to a diet  online https://my.groupjournal.fr  _______________________________________________________  If your blood pressure at your visit was 140/90 or greater, please contact your primary care physician to follow up on this.  _______________________________________________________  If you are age 56 or older, your body mass index should be between 23-30. Your Body mass index is 45.9 kg/m. If this is out of the aforementioned range listed, please consider follow up with your Primary Care Provider.  If you are age 42 or younger, your body mass index should be between 19-25. Your Body mass index is 45.9 kg/m. If this is out of the aformentioned range listed, please consider follow up with your Primary Care Provider.   ________________________________________________________  The [facility] providers would like to encourage you to use MYCHART to communicate with providers for non-urgent requests or questions.  Due to long hold times on the telephone, sending your provider a message by [facility] may be a faster and more efficient way to get a response.  Please allow 48 business hours for a response.  Please remember that this is for non-urgent requests.  _______________________________________________________  [facility] is using a team-based approach to care.  Your team is made up of your doctor and two to three APPS. Our APPS (Nurse Practitioners and Physician Assistants) work with your physician to ensure care continuity for you. They are fully qualified to address your health concerns and develop a treatment plan. They communicate directly with your gastroenterologist to care for you. Seeing the Advanced Practice Practitioners on your physician's team can help you by facilitating care more promptly, often allowing for earlier appointments, access to diagnostic testing, procedures, and other specialty  referrals.

## 2024-01-19 NOTE — Progress Notes (Signed)
 Chief Complaint: Gastroparesis, GERD, IBS-C Primary GI MD: Dr. Eda  HPI: 43 year old female history of MS, gastroparesis, GERD, IBS-C, and others as listed below presents for evaluation.  She has previously been seen by Dr. Rollin and Dr. Tamela. Dr. Rollin diagnosed gastroparesis 2014 with abnormal gastric emptying scan 2017. GES showed 87% activity at 60 minutes, 39% at 120 minutes. She has been on metaclopromide since that time.   EGD with Dr. Tamela 06/19/18 for nausea, vomiting, and history GERD showed irregular z-line, hemorrhagic gastritis, small hiatal hernia, normal duodenum. I reviewed the Gastric biopsies showed normal small bowel, reactive gastritis, negative for H pylori, and reflux esophagitis with rare goblet cells.   Mother with GERD with prior esophageal dilation. Three of her mother's first cousins had colon cancer. Mother and multiple aunts with colon cancer. No known family history of colon cancer or polyps. No family history of uterine/endometrial cancer, pancreatic cancer or gastric/stomach cancer.   ---------TODAY-----------------   Discussed the use of AI scribe software for clinical note transcription with the patient, who gave verbal consent to proceed.  History of Present Illness   She has not seen a gastroenterologist for several years. She is now seeking to establish care to manage her gastrointestinal conditions and review her current medications.  She experiences intermittent reflux associated with GERD, characterized by episodes of feeling like something is stuck in her chest, chest pain, and regurgitation of mucus bubbles. These symptoms occur a couple of times per week. She is currently taking pantoprazole  40 mg once daily .  She reports feelings of bloating and fullness despite not eating since the previous day at 6 PM. These symptoms impact her daily life. She is on metoclopramide  5 mg three times a day, which she currently takes all at night.  Her IBS  with constipation has shown some improvement, with bowel movements occurring twice a week. She describes her stools as soft and formed, though occasionally experiences straining with small, hard stools. She is on Linzess  290 mcg daily.  She has a history of multiple sclerosis, managed with a fentanyl  patch for pain. She recently decreased her fentanyl  dose from 25 mcg to 12 mcg, but has not noticed an improvement in her gastrointestinal symptoms. She also takes Lyrica  for her condition.  Her mother and ex-partner have noticed a protrusion in her abdomen that becomes more pronounced when she feels full, sometimes causing discomfort. She has not been able to follow dietary recommendations from a nutritionist due to the impracticality of the advice given.   PREVIOUS GI WORKUP   She has previously been seen by Dr. Rollin and Dr. Tamela. Dr. Rollin diagnosed gastroparesis 2014 with abnormal gastric emptying scan 2017. GES showed 87% activity at 60 minutes, 39% at 120 minutes. She has been on metaclopromide since that time.   EGD with Dr. Tamela 06/19/18 for nausea, vomiting, and history GERD showed irregular z-line, hemorrhagic gastritis, small hiatal hernia, normal duodenum. I reviewed the Gastric biopsies showed normal small bowel, reactive gastritis, negative for H pylori, and reflux esophagitis with rare goblet cells.   EGD 12/2019 with Dr. Eda - Normal esophagus. Biopsied.  - A small amount of food ( residue) in the stomach.  - Normal stomach. Biopsied.  - Normal examined duodenum. Biopsied. - normal biopsies, negative for barrett's or EOE  Past Medical History:  Diagnosis Date   Arthritis    Bladder spasms    Colonic inertia    Depression    Gastroparesis    GERD (  gastroesophageal reflux disease)    IBS (irritable bowel syndrome)    Insomnia    MS (multiple sclerosis)    Multiple sclerosis    Neuropathy    Obesity    Vision abnormalities     Past Surgical History:  Procedure  Laterality Date   BREAST SURGERY Right 2005   CYSTECTOMY     polyp removal     UPPER GASTROINTESTINAL ENDOSCOPY      Current Outpatient Medications  Medication Sig Dispense Refill   albuterol  (VENTOLIN  HFA) 108 (90 Base) MCG/ACT inhaler Inhale 2 puffs into the lungs every 6 (six) hours as needed for wheezing or shortness of breath. 18 g 0   baclofen  (LIORESAL ) 10 MG tablet TAKE 1 TABLET BY MOUTH THREE TIMES A DAY 270 tablet 3   cetirizine  (ZYRTEC  ALLERGY) 10 MG tablet Take 1 tablet (10 mg total) by mouth daily. 30 tablet 0   clonazePAM  (KLONOPIN ) 0.5 MG tablet Take 1 tablet (0.5 mg total) by mouth at bedtime. 30 tablet 3   drospirenone -ethinyl estradiol  (NIKKI ) 3-0.02 MG tablet Take 1 tablet by mouth daily. 84 tablet 2   fentaNYL  (DURAGESIC ) 12 MCG/HR Place 1 patch onto the skin every 3 (three) days. 10 patch 0   FLUoxetine  (PROZAC ) 40 MG capsule Take 1 capsule (40 mg total) by mouth daily. 90 capsule 1   hydrOXYzine  (ATARAX ) 25 MG tablet Take 1 tablet (25 mg total) by mouth at bedtime. 90 tablet 2   linaclotide  (LINZESS ) 290 MCG CAPS capsule Take 1 capsule (290 mcg total) by mouth daily as needed. 90 capsule 1   meloxicam  (MOBIC ) 15 MG tablet TAKE 1 TABLET (15 MG TOTAL) BY MOUTH DAILY AS NEEDED (FOR HIP PAIN FLARE UPS). 30 tablet 2   metoCLOPramide  (REGLAN ) 5 MG tablet TAKE 2 TABLETS BY MOUTH TWICE A DAY 120 tablet 3   nystatin  cream (MYCOSTATIN ) Apply topically 2 (two) times daily. 30 g 1   pregabalin  (LYRICA ) 150 MG capsule TAKE ONE CAPSULE BY MOUTH AT BEDTIME 90 capsule 1   sucralfate  (CARAFATE ) 1 GM/10ML suspension TAKE 10 MLS (1 G TOTAL) BY MOUTH 2 (TWO) TIMES DAILY AS NEEDED. 600 mL 2   TECFIDERA  240 MG CPDR TAKE 1 CAPSULE (240MG ) BY MOUTH  TWICE DAILY 180 capsule 5   traZODone  (DESYREL ) 100 MG tablet Take 1 tablet (100 mg total) by mouth at bedtime. 90 tablet 2   triamcinolone  cream (KENALOG ) 0.1 % APPLY TO AFFECTED AREA TWICE DAILY AS NEEDED 45 g 0   triamcinolone  cream (KENALOG )  0.5 % Apply 1 Application topically daily as needed.     pantoprazole  (PROTONIX ) 40 MG tablet Take 1 tablet (40 mg total) by mouth 2 (two) times daily. 60 tablet 3   No current facility-administered medications for this visit.    Allergies as of 01/19/2024 - Review Complete 01/19/2024  Allergen Reaction Noted   Penicillins Hives 02/05/2012   Temazepam Nausea Only 02/14/2012   Amoxicillin Rash 02/14/2012   Septra [sulfamethoxazole-trimethoprim] Rash 02/05/2012    Family History  Problem Relation Age of Onset   Neuropathy Mother    Allergic rhinitis Mother    Urticaria Mother    GER disease Mother    Cancer - Other Father    Thyroid  disease Maternal Aunt    Breast cancer Maternal Aunt    Parkinson's disease Maternal Aunt    Breast cancer Maternal Aunt    Diabetes Maternal Grandmother    Hypertension Maternal Grandmother    Kidney failure Maternal Grandmother  Parkinson's disease Maternal Grandmother    Kidney disease Maternal Grandfather    Hyperlipidemia Paternal Grandmother    Hyperlipidemia Paternal Grandfather    Breast cancer Cousin    Colon cancer Other    Asthma Neg Hx    Eczema Neg Hx    Esophageal cancer Neg Hx    Rectal cancer Neg Hx    Stomach cancer Neg Hx    Multiple sclerosis Neg Hx     Social History   Socioeconomic History   Marital status: Single    Spouse name: Not on file   Number of children: Not on file   Years of education: Not on file   Highest education level: Associate degree: occupational, scientist, product/process development, or vocational program  Occupational History   Not on file  Tobacco Use   Smoking status: Former    Types: E-cigarettes    Start date: 02/05/2020    Passive exposure: Never   Smokeless tobacco: Never   Tobacco comments:    Vapes some but not on a regular basis. 06/18/21. Hsm.   Vaping Use   Vaping status: Former   Quit date: 07/04/2022  Substance and Sexual Activity   Alcohol use: Yes    Alcohol/week: 2.0 standard drinks of alcohol     Types: 1 Glasses of wine, 1 Cans of beer per week    Comment: socially   Drug use: No   Sexual activity: Not Currently    Birth control/protection: Pill  Other Topics Concern   Not on file  Social History Narrative   Pt not working    Pt lives alone    Social Drivers of Health   Tobacco Use: Medium Risk (01/19/2024)   Patient History    Smoking Tobacco Use: Former    Smokeless Tobacco Use: Never    Passive Exposure: Never  Physicist, Medical Strain: Low Risk (09/21/2023)   Overall Financial Resource Strain (CARDIA)    Difficulty of Paying Living Expenses: Not very hard  Food Insecurity: No Food Insecurity (09/21/2023)   Epic    Worried About Programme Researcher, Broadcasting/film/video in the Last Year: Never true    Ran Out of Food in the Last Year: Never true  Transportation Needs: No Transportation Needs (09/21/2023)   Epic    Lack of Transportation (Medical): No    Lack of Transportation (Non-Medical): No  Physical Activity: Insufficiently Active (09/21/2023)   Exercise Vital Sign    Days of Exercise per Week: 2 days    Minutes of Exercise per Session: 20 min  Stress: No Stress Concern Present (09/21/2023)   [employer] of Occupational Health - Occupational Stress Questionnaire    Feeling of Stress: Only a little  Social Connections: Moderately Isolated (09/21/2023)   Social Connection and Isolation Panel    Frequency of Communication with Friends and Family: More than three times a week    Frequency of Social Gatherings with Friends and Family: Three times a week    Attends Religious Services: 1 to 4 times per year    Active Member of Clubs or Organizations: No    Attends Banker Meetings: Not on file    Marital Status: Never married  Intimate Partner Violence: Not At Risk (05/30/2023)   Humiliation, Afraid, Rape, and Kick questionnaire    Fear of Current or Ex-Partner: No    Emotionally Abused: No    Physically Abused: No    Sexually Abused: No  Depression (PHQ2-9): Low  Risk (09/22/2023)   Depression (PHQ2-9)  PHQ-2 Score: 0  Alcohol Screen: Low Risk (09/21/2023)   Alcohol Screen    Last Alcohol Screening Score (AUDIT): 3  Housing: Unknown (09/21/2023)   Epic    Unable to Pay for Housing in the Last Year: No    Number of Times Moved in the Last Year: Not on file    Homeless in the Last Year: No  Utilities: At Risk (05/30/2023)   AHC Utilities    Threatened with loss of utilities: Yes  Health Literacy: Adequate Health Literacy (05/30/2023)   B1300 Health Literacy    Frequency of need for help with medical instructions: Never    Review of Systems:    Constitutional: No weight loss, fever, chills, weakness or fatigue HEENT: Eyes: No change in vision               Ears, Nose, Throat:  No change in hearing or congestion Skin: No rash or itching Cardiovascular: No chest pain, chest pressure or palpitations   Respiratory: No SOB or cough Gastrointestinal: See HPI and otherwise negative Genitourinary: No dysuria or change in urinary frequency Neurological: No headache, dizziness or syncope Musculoskeletal: No new muscle or joint pain Hematologic: No bleeding or bruising Psychiatric: No history of depression or anxiety    Physical Exam:  Vital signs: BP 120/76   Pulse 77   Ht 5' 6 (1.676 m)   Wt 284 lb 6 oz (129 kg)   LMP  (LMP Unknown)   BMI 45.90 kg/m   Constitutional: NAD, alert and cooperative Head:  Normocephalic and atraumatic. Eyes:   PEERL, EOMI. No icterus. Conjunctiva pink. Respiratory: Respirations even and unlabored. Lungs clear to auscultation bilaterally.   No wheezes, crackles, or rhonchi.  Cardiovascular:  Regular rate and rhythm. No peripheral edema, cyanosis or pallor.  Gastrointestinal:  Soft, nondistended, nontender. No rebound or guarding. Normal bowel sounds. No appreciable masses or hepatomegaly. Rectal:  Declines Msk:  Symmetrical without gross deformities. Without edema, no deformity or joint abnormality.   Neurologic:  Alert and  oriented x4;  grossly normal neurologically.  Skin:   Dry and intact without significant lesions or rashes. Psychiatric: Oriented to person, place and time. Demonstrates good judgement and reason without abnormal affect or behaviors.  Physical Exam    RELEVANT LABS AND IMAGING: CBC    Component Value Date/Time   WBC 7.8 12/17/2023 1432   WBC 14.0 (H) 02/14/2012 2345   RBC 3.90 12/17/2023 1432   RBC 4.60 02/14/2012 2345   HGB 11.5 12/17/2023 1432   HCT 33.7 (L) 12/17/2023 1432   PLT 237 12/17/2023 1432   MCV 86 12/17/2023 1432   MCH 29.5 12/17/2023 1432   MCH 28.5 02/14/2012 2345   MCHC 34.1 12/17/2023 1432   MCHC 36.0 02/14/2012 2345   RDW 13.3 12/17/2023 1432   LYMPHSABS 2.4 12/17/2023 1432   MONOABS 0.7 02/14/2012 2345   EOSABS 0.1 12/17/2023 1432   BASOSABS 0.0 12/17/2023 1432    CMP     Component Value Date/Time   NA 139 09/22/2023 1203   K 4.6 09/22/2023 1203   CL 104 09/22/2023 1203   CO2 19 (L) 09/22/2023 1203   GLUCOSE 86 09/22/2023 1203   GLUCOSE 98 02/14/2012 2345   BUN 10 09/22/2023 1203   CREATININE 0.83 09/22/2023 1203   CALCIUM 9.4 09/22/2023 1203   PROT 7.7 09/22/2023 1203   ALBUMIN 4.1 09/22/2023 1203   AST 14 09/22/2023 1203   ALT 12 09/22/2023 1203   ALKPHOS 94 09/22/2023 1203  BILITOT 0.3 09/22/2023 1203   GFRNONAA >90 02/14/2012 2345   GFRAA >90 02/14/2012 2345     Assessment/Plan:   GERD EGD 2021 with small amount of food, otherwise normal with biopsies negative for EOE/barrett's. Treated with famotidine and pantoprazole  with breakthrough symptoms. - Increase pantoprazole  40 Mg to twice daily - Educated patient on lifestyle modifications provided patient education handouts  Gastroparesis Gastroparesis by gastric emptying scan 2014. on metoclopramide  5mg  TID, she takes all 3 pills at bedtime. - Recommend spacing out metoclopramide  intake 5 mg pills 3-4 times daily before meals - Patient to MyChart message  me in 2 weeks with an update - Gastroparesis diet - Provided patient education and handouts - If continued symptoms could consider increasing metoclopramide  to 10 mg 3 times daily or consider Motegrity given concurrent constipation - Follow-up with me in 6 to 8 weeks  IBS-C On Linzess  290 mcg with improvement.  She recently decreased her fentanyl  patch from 25 to 12 mcg. - Continue Linzess  290 mcg for now - If no improvement in her gastroparesis symptoms by dividing up metoclopramide  dose, we may do a trial of Motegrity which would also help her constipation  MS On Lyrica  and fentanyl  patch  Obesity BMI 45  Assign to Dr. Stacia today (Monday)  Joy Cobb [facility] 01/19/2024, 2:39 PM  Cc: Jarold Medici, MD

## 2024-01-20 ENCOUNTER — Encounter: Payer: Self-pay | Admitting: Internal Medicine

## 2024-01-20 ENCOUNTER — Other Ambulatory Visit: Payer: Self-pay | Admitting: Internal Medicine

## 2024-01-20 NOTE — Telephone Encounter (Unsigned)
 Copied from CRM [id number]. Topic: Clinical - Medication Refill >> Jan 20, 2024 11:36 AM Kevelyn M wrote: Medication: FLUoxetine  (PROZAC ) 40 MG capsule, hydrOXYzine  (ATARAX ) 25 MG tablet  Has the patient contacted their pharmacy? No (Agent: If no, request that the patient contact the pharmacy for the refill. If patient does not wish to contact the pharmacy document the reason why and proceed with request.) (Agent: If yes, when and what did the pharmacy advise?)  This is the patient's preferred pharmacy:  [id number] [place], [place] - [address] AT [address] [place] [address] Phone: [phone] Fax: [phone]   Is this the correct pharmacy for this prescription? Yes If no, delete pharmacy and type the correct one.   Has the prescription been filled recently? No  Is the patient out of the medication? Yes  Has the patient been seen for an appointment in the last year OR does the patient have an upcoming appointment? Yes  Can we respond through MyChart? Yes  Agent: Please be advised that Rx refills may take up to 3 business days. We ask that you follow-up with your pharmacy.

## 2024-01-22 MED ORDER — FLUOXETINE HCL 40 MG PO CAPS: 40.0000 mg | ORAL_CAPSULE | Freq: Every day | ORAL | 1 refills | Status: AC

## 2024-01-22 MED ORDER — HYDROXYZINE HCL 25 MG PO TABS: 25.0000 mg | ORAL_TABLET | Freq: Every evening | ORAL | 2 refills | Status: AC

## 2024-01-25 NOTE — Progress Notes (Signed)
 Agree with the assessment and plan as outlined by Nestor Blower, PA-C.  I do not see that patient has had a colonoscopy.  With her reported family history of colon cancer (mother and multiple second degree relatives), she should undergo colonoscopy now and every 5 years.

## 2024-02-05 ENCOUNTER — Other Ambulatory Visit: Payer: Self-pay | Admitting: Internal Medicine

## 2024-02-16 ENCOUNTER — Other Ambulatory Visit: Payer: Self-pay | Admitting: Neurology

## 2024-02-16 MED ORDER — FENTANYL 12 MCG/HR TD PT72: 1.0000 | MEDICATED_PATCH | TRANSDERMAL | 0 refills | Status: AC

## 2024-02-16 NOTE — Telephone Encounter (Signed)
 Requested Prescriptions   Pending Prescriptions Disp Refills   fentaNYL  (DURAGESIC ) 12 MCG/HR 10 patch 0    Sig: Place 1 patch onto the skin every 3 (three) days.   Last seen 12/17/23 Next appt 07/21/24  Dispenses   Dispensed Days Supply Quantity Provider Pharmacy  FENTANYL  12 MCG/HR PATCH 01/15/2024 30 10 each Vear Charlie LABOR, MD [facility] [phone] - [facility]...  FENTANYL   12 MCG/HR PT72 12/17/2023 30 10 patch Sater, Charlie LABOR, MD [facility] [phone] - [facility]...  FENTANYL  25 MCG/HR PATCH 12/02/2023 30 10 each Sater, Charlie LABOR, MD [facility] [phone] - [facility]...  FENTANYL  25 MCG/HR PATCH 11/03/2023 30 10 each Lomax, Amy, NP [facility] [phone] - [facility]...  FENTANYL  25 MCG/HR PATCH 10/02/2023 30 10 each Sater, Charlie LABOR, MD [facility] [phone] - [facility]...  FENTANYL  25 MCG/HR PATCH 09/04/2023 30 10 each Sater, Charlie LABOR, MD [facility] [phone] - [facility]...  FENTANYL   25 MCG/HR PT72 08/05/2023 30 10 patch Sater, Charlie LABOR, MD [facility] [phone] - [facility]...  FENTANYL  25 MCG/HR PATCH 07/04/2023 30 10 each Lomax, Amy, NP [facility] [phone] - [facility]...  FENTANYL  25 MCG/HR PATCH 06/06/2023 30 10 each Ines Onetha NOVAK, MD [facility] [phone] - [facility]...  FENTANYL  25 MCG/HR PATCH 05/07/2023 30 10 each Sater, Charlie LABOR, MD [facility] [phone] - [facility]...  FENTANYL  25 MCG/HR PATCH 04/07/2023 30 10 each Lomax, Amy, NP [facility] [phone] - [facility]...  FENTANYL  25 MCG/HR PATCH 03/06/2023 30 10 each Sater, Charlie LABOR, MD [facility] [phone] - [facility]

## 2024-02-16 NOTE — Telephone Encounter (Signed)
 Pt called stating she is out of town and will be back fridat  and is requesting for medication  to be filled fentaNYL  (DURAGESIC ) 12 MCG/HR   Pt medication is to  be sent to  [id number] [place], [place] - [address] Phone: [phone]  Fax: [phone]

## 2024-02-23 ENCOUNTER — Telehealth: Payer: Self-pay | Admitting: Neurology

## 2024-02-23 DIAGNOSIS — G35D Multiple sclerosis, unspecified: Secondary | ICD-10-CM

## 2024-02-23 MED ORDER — TECFIDERA 240 MG PO CPDR: DELAYED_RELEASE_CAPSULE | ORAL | 3 refills | Status: AC

## 2024-02-23 NOTE — Telephone Encounter (Signed)
 refilled

## 2024-02-23 NOTE — Telephone Encounter (Signed)
Pt is requesting a refill for TECFIDERA 240 MG CPDR.  Pharmacy: [facility]

## 2024-03-08 ENCOUNTER — Ambulatory Visit: Admitting: Gastroenterology

## 2024-03-29 ENCOUNTER — Ambulatory Visit: Payer: Self-pay | Admitting: Internal Medicine

## 2024-06-09 ENCOUNTER — Ambulatory Visit: Payer: Self-pay

## 2024-07-21 ENCOUNTER — Ambulatory Visit: Admitting: Neurology

## 2024-09-27 ENCOUNTER — Encounter: Payer: Self-pay | Admitting: Internal Medicine
